# Patient Record
Sex: Female | Born: 1965 | Race: Black or African American | Hispanic: No | Marital: Married | Smoking: Current some day smoker
Health system: Southern US, Community
[De-identification: ages and names within clinical notes are randomized; demographics above are authoritative.]

## PROBLEM LIST (undated history)

## (undated) DIAGNOSIS — J45909 Unspecified asthma, uncomplicated: Secondary | ICD-10-CM

## (undated) DIAGNOSIS — I639 Cerebral infarction, unspecified: Secondary | ICD-10-CM

## (undated) DIAGNOSIS — I10 Essential (primary) hypertension: Secondary | ICD-10-CM

## (undated) DIAGNOSIS — F419 Anxiety disorder, unspecified: Secondary | ICD-10-CM

## (undated) DIAGNOSIS — R569 Unspecified convulsions: Secondary | ICD-10-CM

## (undated) HISTORY — DX: Cerebral infarction, unspecified: I63.9

## (undated) HISTORY — DX: Anxiety disorder, unspecified: F41.9

## (undated) HISTORY — PX: TUBAL LIGATION: SHX77

## (undated) HISTORY — DX: Essential (primary) hypertension: I10

## (undated) HISTORY — DX: Unspecified convulsions: R56.9

## (undated) HISTORY — DX: Unspecified asthma, uncomplicated: J45.909

---

## 1999-07-25 ENCOUNTER — Emergency Department (HOSPITAL_COMMUNITY): Admission: EM | Admit: 1999-07-25 | Discharge: 1999-07-25 | Payer: Self-pay | Admitting: Emergency Medicine

## 2000-10-10 ENCOUNTER — Emergency Department (HOSPITAL_COMMUNITY): Admission: EM | Admit: 2000-10-10 | Discharge: 2000-10-10 | Payer: Self-pay | Admitting: Emergency Medicine

## 2000-10-11 ENCOUNTER — Emergency Department (HOSPITAL_COMMUNITY): Admission: EM | Admit: 2000-10-11 | Discharge: 2000-10-11 | Payer: Self-pay | Admitting: Emergency Medicine

## 2003-04-06 ENCOUNTER — Encounter: Payer: Self-pay | Admitting: Emergency Medicine

## 2003-04-06 ENCOUNTER — Inpatient Hospital Stay (HOSPITAL_COMMUNITY): Admission: EM | Admit: 2003-04-06 | Discharge: 2003-04-10 | Payer: Self-pay | Admitting: Emergency Medicine

## 2003-04-07 ENCOUNTER — Encounter: Payer: Self-pay | Admitting: Internal Medicine

## 2003-04-09 ENCOUNTER — Encounter: Payer: Self-pay | Admitting: Internal Medicine

## 2003-04-16 ENCOUNTER — Emergency Department (HOSPITAL_COMMUNITY): Admission: EM | Admit: 2003-04-16 | Discharge: 2003-04-16 | Payer: Self-pay | Admitting: *Deleted

## 2017-02-23 LAB — GLUCOSE, POCT (MANUAL RESULT ENTRY): POC GLUCOSE: 117 mg/dL — AB (ref 70–99)

## 2017-04-28 NOTE — Congregational Nurse Program (Signed)
Congregational Nurse Program Note  Date of Encounter: 04/15/2017  Past Medical History: No past medical history on file.  Encounter Details:     CNP Questionnaire - 04/15/17 1602      Patient Demographics   Is this a new or existing patient? New   Patient is considered a/an Not Applicable   Race American Indian/[place] Native     Patient Assistance   Location of Patient Assistance Not Applicable   Patient's financial/insurance status Low Income;Self-Pay (Uninsured)   Uninsured Patient ([employer] Card/Care Connects) Yes   Interventions Assisted patient in making appt.   Patient referred to apply for the following financial assistance [employer]   Food insecurities addressed Not Applicable   Transportation assistance No   Assistance securing medications No   Product/process development scientistducational health offerings Navigating the healthcare system     Encounter Details   Primary purpose of visit Navigating the Healthcare System   Was an Emergency Department visit averted? Not Applicable   Does patient have a medical provider? No   Patient referred to Clinic   Was a mental health screening completed? (GAINS tool) No   Does patient have dental issues? No   Does patient have vision issues? No   Does your patient have an abnormal blood pressure today? No   Since previous encounter, have you referred patient for abnormal blood pressure that resulted in a new diagnosis or medication change? No   Does your patient have an abnormal blood glucose today? No   Since previous encounter, have you referred patient for abnormal blood glucose that resulted in a new diagnosis or medication change? No   Was there a life-saving intervention made? No      Requested assistance with orange card application.  Also needs a HCP.  Assisted with appointment with Lavinia SharpsMary Ann Placey NP at the [facility]

## 2017-05-24 ENCOUNTER — Emergency Department (HOSPITAL_COMMUNITY)
Admission: EM | Admit: 2017-05-24 | Discharge: 2017-05-24 | Disposition: A | Discharge status: Home / Self Care | Payer: Self-pay | Attending: Emergency Medicine | Admitting: Emergency Medicine

## 2017-05-24 ENCOUNTER — Encounter (HOSPITAL_COMMUNITY): Payer: Self-pay | Admitting: Emergency Medicine

## 2017-05-24 DIAGNOSIS — Z5321 Procedure and treatment not carried out due to patient leaving prior to being seen by health care provider: Secondary | ICD-10-CM | POA: Insufficient documentation

## 2017-05-24 DIAGNOSIS — M79641 Pain in right hand: Secondary | ICD-10-CM | POA: Insufficient documentation

## 2017-05-24 DIAGNOSIS — M79642 Pain in left hand: Secondary | ICD-10-CM | POA: Insufficient documentation

## 2017-05-24 NOTE — ED Triage Notes (Signed)
I have made 2 different attempts to locate Pt in front lobby. Pt does answer when name is called.

## 2017-05-24 NOTE — ED Notes (Signed)
Called pt for rounding no answer

## 2017-05-24 NOTE — ED Triage Notes (Signed)
Pt sts bilateral hand pain and swelling starting last night; pt unsure of what caused swelling

## 2018-02-27 ENCOUNTER — Emergency Department (HOSPITAL_COMMUNITY)
Admission: EM | Admit: 2018-02-27 | Discharge: 2018-02-27 | Disposition: A | Discharge status: Home / Self Care | Payer: Self-pay | Attending: Emergency Medicine | Admitting: Emergency Medicine

## 2018-02-27 ENCOUNTER — Encounter (HOSPITAL_COMMUNITY): Payer: Self-pay

## 2018-02-27 DIAGNOSIS — F17200 Nicotine dependence, unspecified, uncomplicated: Secondary | ICD-10-CM | POA: Insufficient documentation

## 2018-02-27 DIAGNOSIS — H60503 Unspecified acute noninfective otitis externa, bilateral: Secondary | ICD-10-CM | POA: Insufficient documentation

## 2018-02-27 MED ORDER — CIPROFLOXACIN-DEXAMETHASONE 0.3-0.1 % OT SUSP
4.0000 [drp] | Freq: Two times a day (BID) | OTIC | Status: DC
  Filled 2018-02-27: qty 7.5

## 2018-02-27 NOTE — ED Triage Notes (Signed)
Pt presents for evaluation of R ear pain x 2 weeks. Reports ear has been swelling and is not able to hear out of it. Pt reports no other issues.

## 2018-02-27 NOTE — ED Provider Notes (Signed)
[facility] EMERGENCY DEPARTMENT Provider Note   CSN: [id number] Arrival date & time: 02/27/18  1420   History   Chief Complaint Chief Complaint  Patient presents with  . Otalgia    HPI Brandy Nguyen is a 52 y.o. female.  HPI   52 year old female presents today with complaints of bilateral ear pain.  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  She notes that when she gets home she washes her ears out with soap, she notes irritation to the right external ear with swelling of the canal, she notes she has been using sweet oil without improvement in symptoms.  Also notes some developing soreness in the left ear.  She denies any hearing changes on the left, notes some hearing changes on the right, she notes pain with palpation of the tragus, no surrounding swelling or edema, no fever, no other complaints.  She is not diabetic.   History reviewed. No pertinent past medical history.  There are no active problems to display for this patient.   History reviewed. No pertinent surgical history.   OB History   None      Home Medications    Prior to Admission medications   Not on File    Family History No family history on file.  Social History Social History   Tobacco Use  . Smoking status: Current Every Day Smoker  . Smokeless tobacco: Never Used  Substance Use Topics  . Alcohol use: Yes  . Drug use: No     Allergies   Patient has no known allergies.   Review of Systems Review of Systems  All other systems reviewed and are negative.    Physical Exam Updated Vital Signs BP (!) 148/94 (BP Location: Right Arm)   Pulse 61   Temp 98 F (36.7 C) (Oral)   Resp 18   SpO2 100%   Physical Exam  Constitutional: She is oriented to person, place, and time. She appears well-developed and well-nourished.  HENT:  Head: Normocephalic and atraumatic.  Right external auditory canal with significant swelling unable to visualize  the TM, no purulent discharge, pain with palpation of the tragus, no mastoid tenderness of surrounding redness tenderness warmth to touch  Left external auditory canal slightly erythematous, TM normal, no discharge  Eyes: Pupils are equal, round, and reactive to light. Conjunctivae are normal. Right eye exhibits no discharge. Left eye exhibits no discharge. No scleral icterus.  Neck: Normal range of motion. No JVD present. No tracheal deviation present.  Pulmonary/Chest: Effort normal. No stridor.  Neurological: She is alert and oriented to person, place, and time. Coordination normal.  Psychiatric: She has a normal mood and affect. Her behavior is normal. Judgment and thought content normal.  Nursing note and vitals reviewed.    ED Treatments / Results  Labs (all labs ordered are listed, but only abnormal results are displayed) Labs Reviewed - No data to display  EKG None  Radiology No results found.  Procedures Procedures (including critical care time)  Medications Ordered in ED Medications  ciprofloxacin-dexamethasone (CIPRODEX) 0.3-0.1 % OTIC (EAR) suspension 4 drop (has no administration in time range)     Initial Impression / Assessment and Plan / ED Course  I have reviewed the triage vital signs and the nursing notes.  Pertinent labs & imaging results that were available during my care of the patient were reviewed by me and considered in my medical decision making (see chart for details).  [age]year old female presents today with otitis externa.  Patient has significant swelling on the right, ear wick was placed with Ciprodex drops.  Patient discharged home with medication, she will follow-up if symptoms worsen or persist, return immediately at any point for any concerning signs or symptoms.  Patient verbalized understanding and agreement to today's plan had no further questions or concerns.  Final Clinical Impressions(s) / ED Diagnoses   Final diagnoses:  Acute  otitis externa of both ears, unspecified type    ED Discharge Orders    None       Eyvonne Mechanic, PA-C 02/27/18 1707    Pricilla Loveless, MD 02/28/18 2239

## 2018-02-27 NOTE — Discharge Instructions (Signed)
Please read attached information. If you experience any new or worsening signs or symptoms please return to the emergency room for evaluation. Please follow-up with your primary care provider or specialist as discussed. Please use medication prescribed (4 drops in each ear twice a day for 7 days) discontinue taking if you have any concerning signs or symptoms.

## 2020-05-27 ENCOUNTER — Ambulatory Visit: Payer: Self-pay | Attending: Internal Medicine

## 2020-05-27 DIAGNOSIS — Z23 Encounter for immunization: Secondary | ICD-10-CM

## 2020-05-27 NOTE — Progress Notes (Signed)
   Covid-19 Vaccination Clinic  Name:  Brandy Nguyen    MRN: [id number] DOB: 01-Apr-1966  05/27/2020  Ms. Meter was observed post Covid-19 immunization for 15 minutes without incident. She was provided with Vaccine Information Sheet and instruction to access the V-Safe system.   [name] was instructed to call 911 with any severe reactions post vaccine: Marland Kitchen Difficulty breathing  . Swelling of face and throat  . A fast heartbeat  . A bad rash all over body  . Dizziness and weakness   Immunizations Administered    Name Date Dose VIS Date Route   Moderna COVID-19 Vaccine 05/27/2020 11:12 AM 0.5 mL 09/2019 Intramuscular   Manufacturer: Moderna   Lot: [id number]   NDC: 87681-157-26

## 2020-06-24 ENCOUNTER — Ambulatory Visit: Payer: Self-pay

## 2021-03-10 ENCOUNTER — Emergency Department (HOSPITAL_COMMUNITY): Payer: Self-pay

## 2021-03-10 ENCOUNTER — Inpatient Hospital Stay (HOSPITAL_COMMUNITY)
Admission: EM | Admit: 2021-03-10 | Discharge: 2021-03-17 | DRG: 064 | Disposition: A | Discharge status: Rehabilitation Facility | Payer: Self-pay | Attending: Family Medicine | Admitting: Family Medicine

## 2021-03-10 ENCOUNTER — Inpatient Hospital Stay (HOSPITAL_COMMUNITY): Payer: Self-pay

## 2021-03-10 DIAGNOSIS — H538 Other visual disturbances: Secondary | ICD-10-CM | POA: Diagnosis present

## 2021-03-10 DIAGNOSIS — R29810 Facial weakness: Secondary | ICD-10-CM | POA: Diagnosis present

## 2021-03-10 DIAGNOSIS — E7800 Pure hypercholesterolemia, unspecified: Secondary | ICD-10-CM | POA: Diagnosis present

## 2021-03-10 DIAGNOSIS — I639 Cerebral infarction, unspecified: Secondary | ICD-10-CM | POA: Diagnosis present

## 2021-03-10 DIAGNOSIS — W19XXXA Unspecified fall, initial encounter: Secondary | ICD-10-CM | POA: Diagnosis present

## 2021-03-10 DIAGNOSIS — I63511 Cerebral infarction due to unspecified occlusion or stenosis of right middle cerebral artery: Principal | ICD-10-CM | POA: Diagnosis present

## 2021-03-10 DIAGNOSIS — Z720 Tobacco use: Secondary | ICD-10-CM | POA: Diagnosis present

## 2021-03-10 DIAGNOSIS — D751 Secondary polycythemia: Secondary | ICD-10-CM | POA: Diagnosis present

## 2021-03-10 DIAGNOSIS — F70 Mild intellectual disabilities: Secondary | ICD-10-CM | POA: Diagnosis present

## 2021-03-10 DIAGNOSIS — G8194 Hemiplegia, unspecified affecting left nondominant side: Secondary | ICD-10-CM | POA: Diagnosis present

## 2021-03-10 DIAGNOSIS — F1410 Cocaine abuse, uncomplicated: Secondary | ICD-10-CM | POA: Diagnosis present

## 2021-03-10 DIAGNOSIS — I629 Nontraumatic intracranial hemorrhage, unspecified: Secondary | ICD-10-CM | POA: Diagnosis present

## 2021-03-10 DIAGNOSIS — R29702 NIHSS score 2: Secondary | ICD-10-CM | POA: Diagnosis present

## 2021-03-10 DIAGNOSIS — D72829 Elevated white blood cell count, unspecified: Secondary | ICD-10-CM | POA: Diagnosis present

## 2021-03-10 DIAGNOSIS — E785 Hyperlipidemia, unspecified: Secondary | ICD-10-CM | POA: Diagnosis present

## 2021-03-10 DIAGNOSIS — E876 Hypokalemia: Secondary | ICD-10-CM | POA: Diagnosis present

## 2021-03-10 DIAGNOSIS — Z20822 Contact with and (suspected) exposure to covid-19: Secondary | ICD-10-CM | POA: Diagnosis present

## 2021-03-10 DIAGNOSIS — R29898 Other symptoms and signs involving the musculoskeletal system: Secondary | ICD-10-CM | POA: Diagnosis present

## 2021-03-10 DIAGNOSIS — R471 Dysarthria and anarthria: Secondary | ICD-10-CM | POA: Diagnosis present

## 2021-03-10 DIAGNOSIS — R414 Neurologic neglect syndrome: Secondary | ICD-10-CM | POA: Diagnosis present

## 2021-03-10 DIAGNOSIS — E860 Dehydration: Secondary | ICD-10-CM | POA: Diagnosis present

## 2021-03-10 DIAGNOSIS — Z91410 Personal history of adult physical and sexual abuse: Secondary | ICD-10-CM

## 2021-03-10 DIAGNOSIS — K5900 Constipation, unspecified: Secondary | ICD-10-CM | POA: Diagnosis present

## 2021-03-10 DIAGNOSIS — G40909 Epilepsy, unspecified, not intractable, without status epilepticus: Secondary | ICD-10-CM

## 2021-03-10 DIAGNOSIS — R54 Age-related physical debility: Secondary | ICD-10-CM | POA: Diagnosis present

## 2021-03-10 DIAGNOSIS — N179 Acute kidney failure, unspecified: Secondary | ICD-10-CM | POA: Diagnosis not present

## 2021-03-10 DIAGNOSIS — F17210 Nicotine dependence, cigarettes, uncomplicated: Secondary | ICD-10-CM | POA: Diagnosis present

## 2021-03-10 DIAGNOSIS — Z833 Family history of diabetes mellitus: Secondary | ICD-10-CM

## 2021-03-10 DIAGNOSIS — Z609 Problem related to social environment, unspecified: Secondary | ICD-10-CM | POA: Diagnosis present

## 2021-03-10 DIAGNOSIS — G936 Cerebral edema: Secondary | ICD-10-CM | POA: Diagnosis present

## 2021-03-10 DIAGNOSIS — R9431 Abnormal electrocardiogram [ECG] [EKG]: Secondary | ICD-10-CM | POA: Diagnosis present

## 2021-03-10 DIAGNOSIS — I10 Essential (primary) hypertension: Secondary | ICD-10-CM | POA: Diagnosis present

## 2021-03-10 DIAGNOSIS — R569 Unspecified convulsions: Secondary | ICD-10-CM | POA: Diagnosis present

## 2021-03-10 LAB — DIFFERENTIAL
Abs Immature Granulocytes: 0.05 10*3/uL (ref 0.00–0.07)
Basophils Absolute: 0 10*3/uL (ref 0.0–0.1)
Basophils Relative: 0 %
Eosinophils Absolute: 0 10*3/uL (ref 0.0–0.5)
Eosinophils Relative: 0 %
Immature Granulocytes: 0 %
Lymphocytes Relative: 10 %
Lymphs Abs: 1.3 10*3/uL (ref 0.7–4.0)
Monocytes Absolute: 0.6 10*3/uL (ref 0.1–1.0)
Monocytes Relative: 4 %
Neutro Abs: 11.3 10*3/uL — ABNORMAL HIGH (ref 1.7–7.7)
Neutrophils Relative %: 86 %

## 2021-03-10 LAB — CBC
HCT: 52.2 % — ABNORMAL HIGH (ref 36.0–46.0)
Hemoglobin: 17.7 g/dL — ABNORMAL HIGH (ref 12.0–15.0)
MCH: 31.9 pg (ref 26.0–34.0)
MCHC: 33.9 g/dL (ref 30.0–36.0)
MCV: 94.1 fL (ref 80.0–100.0)
Platelets: 250 10*3/uL (ref 150–400)
RBC: 5.55 MIL/uL — ABNORMAL HIGH (ref 3.87–5.11)
RDW: 13 % (ref 11.5–15.5)
WBC: 13.2 10*3/uL — ABNORMAL HIGH (ref 4.0–10.5)
nRBC: 0 % (ref 0.0–0.2)

## 2021-03-10 LAB — COMPREHENSIVE METABOLIC PANEL
ALT: 14 U/L (ref 0–44)
AST: 30 U/L (ref 15–41)
Albumin: 2.4 g/dL — ABNORMAL LOW (ref 3.5–5.0)
Alkaline Phosphatase: 53 U/L (ref 38–126)
Anion gap: 7 (ref 5–15)
BUN: 8 mg/dL (ref 6–20)
CO2: 18 mmol/L — ABNORMAL LOW (ref 22–32)
Calcium: 6.8 mg/dL — ABNORMAL LOW (ref 8.9–10.3)
Chloride: 114 mmol/L — ABNORMAL HIGH (ref 98–111)
Creatinine, Ser: 0.77 mg/dL (ref 0.44–1.00)
GFR, Estimated: >60 mL/min (ref >60)
Glucose, Bld: 97 mg/dL (ref 70–99)
Potassium: 3.3 mmol/L — ABNORMAL LOW (ref 3.5–5.1)
Sodium: 139 mmol/L (ref 135–145)
Total Bilirubin: 0.6 mg/dL (ref 0.3–1.2)
Total Protein: 4.7 g/dL — ABNORMAL LOW (ref 6.5–8.1)

## 2021-03-10 LAB — URINALYSIS, ROUTINE W REFLEX MICROSCOPIC
Bilirubin Urine: NEGATIVE
Glucose, UA: NEGATIVE mg/dL
Hgb urine dipstick: NEGATIVE
Ketones, ur: NEGATIVE mg/dL
Leukocytes,Ua: NEGATIVE
Nitrite: NEGATIVE
Protein, ur: NEGATIVE mg/dL
Specific Gravity, Urine: >1.046 — ABNORMAL HIGH (ref 1.005–1.030)
pH: 5 (ref 5.0–8.0)

## 2021-03-10 LAB — RAPID URINE DRUG SCREEN, HOSP PERFORMED
Amphetamines: NOT DETECTED
Barbiturates: NOT DETECTED
Benzodiazepines: NOT DETECTED
Cocaine: POSITIVE — AB
Opiates: NOT DETECTED
Tetrahydrocannabinol: NOT DETECTED

## 2021-03-10 LAB — MAGNESIUM: Magnesium: 1.1 mg/dL — ABNORMAL LOW (ref 1.7–2.4)

## 2021-03-10 LAB — CBG MONITORING, ED: Glucose-Capillary: 139 mg/dL — ABNORMAL HIGH (ref 70–99)

## 2021-03-10 LAB — RESP PANEL BY RT-PCR (FLU A&B, COVID) ARPGX2
Influenza A by PCR: NEGATIVE
Influenza B by PCR: NEGATIVE
SARS Coronavirus 2 by RT PCR: NEGATIVE

## 2021-03-10 MED ORDER — ACETAMINOPHEN 500 MG PO TABS
1000.0000 mg | ORAL_TABLET | Freq: Once | ORAL | Status: AC
  Administered 2021-03-10: 1000 mg via ORAL
  Filled 2021-03-10: qty 2

## 2021-03-10 MED ORDER — DEXAMETHASONE SODIUM PHOSPHATE 4 MG/ML IJ SOLN: 4.0000 mg | Freq: Four times a day (QID) | INTRAMUSCULAR | Status: DC

## 2021-03-10 MED ORDER — LEVETIRACETAM IN NACL 500 MG/100ML IV SOLN
500.0000 mg | Freq: Two times a day (BID) | INTRAVENOUS | Status: DC
  Administered 2021-03-10 – 2021-03-12 (×3): 500 mg via INTRAVENOUS
  Filled 2021-03-10 (×3): qty 100

## 2021-03-10 MED ORDER — ACETAMINOPHEN 160 MG/5ML PO SOLN: 650.0000 mg | ORAL | Status: DC | PRN

## 2021-03-10 MED ORDER — LEVETIRACETAM IN NACL 1500 MG/100ML IV SOLN
1500.0000 mg | Freq: Once | INTRAVENOUS | Status: AC
  Administered 2021-03-10: 1500 mg via INTRAVENOUS
  Filled 2021-03-10: qty 100

## 2021-03-10 MED ORDER — POTASSIUM CHLORIDE 10 MEQ/100ML IV SOLN
10.0000 meq | INTRAVENOUS | Status: DC
  Administered 2021-03-10 (×2): 10 meq via INTRAVENOUS
  Filled 2021-03-10 (×2): qty 100

## 2021-03-10 MED ORDER — MAGNESIUM SULFATE 4 GM/100ML IV SOLN
4.0000 g | Freq: Once | INTRAVENOUS | Status: AC
  Administered 2021-03-11: 4 g via INTRAVENOUS
  Filled 2021-03-10: qty 100

## 2021-03-10 MED ORDER — POTASSIUM CHLORIDE 10 MEQ/100ML IV SOLN
10.0000 meq | INTRAVENOUS | Status: DC
  Administered 2021-03-10: 10 meq via INTRAVENOUS
  Filled 2021-03-10: qty 100

## 2021-03-10 MED ORDER — STROKE: EARLY STAGES OF RECOVERY BOOK
Freq: Once | Status: DC
  Filled 2021-03-10: qty 1

## 2021-03-10 MED ORDER — ACETAMINOPHEN 325 MG PO TABS
650.0000 mg | ORAL_TABLET | Freq: Four times a day (QID) | ORAL | Status: DC | PRN
  Administered 2021-03-13 – 2021-03-16 (×3): 650 mg via ORAL
  Filled 2021-03-10 (×4): qty 2

## 2021-03-10 MED ORDER — ACETAMINOPHEN 650 MG RE SUPP: 650.0000 mg | RECTAL | Status: DC | PRN

## 2021-03-10 MED ORDER — POTASSIUM CHLORIDE CRYS ER 10 MEQ PO TBCR
30.0000 meq | EXTENDED_RELEASE_TABLET | Freq: Once | ORAL | Status: AC
  Administered 2021-03-11: 30 meq via ORAL
  Filled 2021-03-10: qty 3

## 2021-03-10 MED ORDER — GADOBUTROL 1 MMOL/ML IV SOLN
7.0000 mL | Freq: Once | INTRAVENOUS | Status: AC | PRN
  Administered 2021-03-10: 7 mL via INTRAVENOUS

## 2021-03-10 MED ORDER — ACETAMINOPHEN 650 MG RE SUPP: 650.0000 mg | Freq: Four times a day (QID) | RECTAL | Status: DC | PRN

## 2021-03-10 MED ORDER — HYDRALAZINE HCL 20 MG/ML IJ SOLN
5.0000 mg | Freq: Once | INTRAMUSCULAR | Status: AC | PRN
  Administered 2021-03-10: 5 mg via INTRAVENOUS
  Filled 2021-03-10: qty 1

## 2021-03-10 MED ORDER — IOHEXOL 350 MG/ML SOLN
100.0000 mL | Freq: Once | INTRAVENOUS | Status: AC | PRN
  Administered 2021-03-10: 75 mL via INTRAVENOUS

## 2021-03-10 MED ORDER — CLEVIDIPINE BUTYRATE 0.5 MG/ML IV EMUL: 0.0000 mg/h | INTRAVENOUS | Status: DC

## 2021-03-10 MED ORDER — ACETAMINOPHEN 325 MG PO TABS: 650.0000 mg | ORAL_TABLET | ORAL | Status: DC | PRN

## 2021-03-10 MED ORDER — SENNOSIDES-DOCUSATE SODIUM 8.6-50 MG PO TABS
1.0000 | ORAL_TABLET | Freq: Two times a day (BID) | ORAL | Status: DC
  Administered 2021-03-12 – 2021-03-17 (×11): 1 via ORAL
  Filled 2021-03-10 (×11): qty 1

## 2021-03-10 MED ORDER — PANTOPRAZOLE SODIUM 40 MG IV SOLR
40.0000 mg | Freq: Every day | INTRAVENOUS | Status: DC
  Administered 2021-03-10 – 2021-03-12 (×2): 40 mg via INTRAVENOUS
  Filled 2021-03-10 (×2): qty 40

## 2021-03-10 MED ORDER — LABETALOL HCL 5 MG/ML IV SOLN: 10.0000 mg | Freq: Once | INTRAVENOUS | Status: DC | PRN

## 2021-03-10 NOTE — ED Notes (Signed)
Assumed care of patient.

## 2021-03-10 NOTE — ED Notes (Signed)
Patient transported to CT 

## 2021-03-10 NOTE — ED Notes (Signed)
ED Provider at bedside. 

## 2021-03-10 NOTE — ED Provider Notes (Signed)
[facility] EMERGENCY DEPARTMENT Provider Note   CSN: [id number] Arrival date & time: 03/10/21  1050     History Chief Complaint  Patient presents with  . Seizures    Brandy Nguyen is a 55 y.o. female.  Patient is a 55 year old female with a history of tobacco abuse who has not seen a doctor in years presenting today with multiple complaints.  Patient reports that on Sunday she had a fall when she tried to get out of bed and her left leg would not work.  Family member reports that since Sunday which is 3 days prior to arrival the patient has not been herself.  She has been moving slowly having difficulty with her vision.  Patient does complain of headaches and initially stated her left arm would not move at all but now seems to be working more normally.  Today family member reports that she had a 45-second episode of shaking which self resolved.  She did not have loss of consciousness or unresponsiveness during this event.  Patient currently is complaining of headache.  She takes new medications at home.  She has no chest pain, shortness of breath, abdominal pain, nausea or vomiting.  EMS noted that patient was hypertensive in route.  Blood sugar was within normal limits.  They witnessed no seizure-like activity upon their arrival and patient was not displaying signs of being postictal.  The history is provided by the patient, the EMS personnel and a relative.  Seizures      No past medical history on file.  There are no problems to display for this patient.   No past surgical history on file.   OB History   No obstetric history on file.     No family history on file.  Social History   Tobacco Use  . Smoking status: Current Every Day Smoker  . Smokeless tobacco: Never Used  Substance Use Topics  . Alcohol use: Yes  . Drug use: No    Home Medications Prior to Admission medications   Not on File    Allergies    Patient has no known  allergies.  Review of Systems   Review of Systems  Neurological: Positive for seizures.  All other systems reviewed and are negative.   Physical Exam Updated Vital Signs BP (!) 158/119 (BP Location: Right Arm)   Pulse 76   Temp 98.9 F (37.2 C) (Oral)   Resp 15   SpO2 98%   Physical Exam Vitals and nursing note reviewed.  Constitutional:      General: She is not in acute distress.    Appearance: Normal appearance. She is well-developed.  HENT:     Head: Normocephalic and atraumatic.  Eyes:     General: Visual field deficit present.     Conjunctiva/sclera: Conjunctivae normal.     Pupils: Pupils are equal, round, and reactive to light.  Cardiovascular:     Rate and Rhythm: Normal rate and regular rhythm.     Heart sounds: No murmur heard.   Pulmonary:     Effort: Pulmonary effort is normal. No respiratory distress.     Breath sounds: Normal breath sounds. No wheezing or rales.  Abdominal:     General: There is no distension.     Palpations: Abdomen is soft.     Tenderness: There is no abdominal tenderness. There is no guarding or rebound.  Musculoskeletal:        General: No tenderness. Normal range of motion.  Cervical back: Normal range of motion and neck supple.  Skin:    General: Skin is warm and dry.     Findings: No erythema or rash.  Neurological:     Mental Status: She is alert.     Cranial Nerves: No dysarthria or facial asymmetry.     Sensory: Sensation is intact.     Motor: Pronator drift present.     Coordination: Finger-Nose-Finger Test normal.     Comments: Oriented to person and place.  Patient is not able to track eyes to the left beyond midline.  Visual Field cut on the left.  No notable weakness in all 4 extremities but mild pronator drift in the left upper extremity.  Psychiatric:        Mood and Affect: Mood normal.        Behavior: Behavior normal.     ED Results / Procedures / Treatments   Labs (all labs ordered are listed, but only  abnormal results are displayed) Labs Reviewed  CBC - Abnormal; Notable for the following components:      Result Value   WBC 13.2 (*)    RBC 5.55 (*)    Hemoglobin 17.7 (*)    HCT 52.2 (*)    All other components within normal limits  DIFFERENTIAL - Abnormal; Notable for the following components:   Neutro Abs 11.3 (*)    All other components within normal limits  COMPREHENSIVE METABOLIC PANEL - Abnormal; Notable for the following components:   Potassium 3.3 (*)    Chloride 114 (*)    CO2 18 (*)    Calcium 6.8 (*)    Total Protein 4.7 (*)    Albumin 2.4 (*)    All other components within normal limits  CBG MONITORING, ED - Abnormal; Notable for the following components:   Glucose-Capillary 139 (*)    All other components within normal limits  RESP PANEL BY RT-PCR (FLU A&B, COVID) ARPGX2  RAPID URINE DRUG SCREEN, HOSP PERFORMED  URINALYSIS, ROUTINE W REFLEX MICROSCOPIC    EKG EKG Interpretation  Date/Time:  Tuesday March 10 2021 11:04:25 EDT Ventricular Rate:  75 PR Interval:  146 QRS Duration: 82 QT Interval:  520 QTC Calculation: 580 R Axis:   -45 Text Interpretation: Normal sinus rhythm Right atrial enlargement Left axis deviation Minimal voltage criteria for LVH, may be normal variant ( Cornell product ) Prolonged QT Confirmed by Gwyneth Sprout ([id number]) on 03/10/2021 1:54:36 PM    Radiology CT HEAD WO CONTRAST  Result Date: 03/10/2021 CLINICAL DATA:  Possible seizure EXAM: CT HEAD WITHOUT CONTRAST TECHNIQUE: Contiguous axial images were obtained from the base of the skull through the vertex without intravenous contrast. COMPARISON:  None. FINDINGS: Brain: There is concern for a mass within the right frontoparietal region measuring approximately 2.5 cm. Surrounding edema throughout the posterior frontal and parietal lobes. No significant midline shift. No hydrocephalus. Vascular: No hyperdense vessel or unexpected calcification. Skull: No acute calvarial abnormality.  Sinuses/Orbits: No acute findings Other: None IMPRESSION: Concern for 2.5 cm right frontoparietal mass with surrounding edema. Recommend further evaluation with MRI. These results were called by telephone at the time of interpretation on 03/10/2021 at 11:57 am to provider [facility] , who verbally acknowledged these results. Electronically Signed   By: Charlett Nose M.D.   On: 03/10/2021 11:58    Procedures Procedures   Medications Ordered in ED Medications - No data to display  ED Course  I have reviewed the triage vital signs and  the nursing notes.  Pertinent labs & imaging results that were available during my care of the patient were reviewed by me and considered in my medical decision making (see chart for details).    MDM Rules/Calculators/A&P                          Pleasant 55 year old female presenting today with symptoms concerning for acute head bleed, stroke, space-occupying lesion.  Patient with symptoms that could have been focal seizure today but 3 days now of left-sided deficits.  On exam patient does have a visual field cut.  There is no seizure-like activity at this time but does have a mild pronator drift on the left.  Patient is also hypertensive and has not seen a doctor for years and takes no medications.  No evidence of atrial fibrillation at this time.  Stroke labs and imaging are pending.  12:16 PM Patient's COVID is negative, CBC with mild leukocytosis of 13 hemoglobin of 17 and normal platelet count, CT today shows a concerning area of 2.5 cm right fronto parietal mass with surrounding edema and MRI was recommended.  Given findings of the mass and possible seizure activity today patient was loaded with 1500 mg of Keppra.  EKG with prolonged QT and findings consistent with LVH without old to compare.  CMP is still pending.  Will discuss with neurology.  3:00 PM MRI is more consistent with a stroke than mass.  There is also some decreased flow in the MCA territory  and they recommended a CTA.  Neurology is already consulted on the patient.  We will plan on admitting for further care.  Patient's symptoms have been ongoing for 3 to 4 days now and she is not a tPA candidate.  There is small petechial hemorrhage in the area as well.  MDM Number of Diagnoses or Management Options   Amount and/or Complexity of Data Reviewed Clinical lab tests: ordered and reviewed Tests in the radiology section of CPT: ordered and reviewed Tests in the medicine section of CPT: ordered and reviewed Decide to obtain previous medical records or to obtain history from someone other than the patient: yes Obtain history from someone other than the patient: yes Review and summarize past medical records: yes Discuss the patient with other providers: yes Independent visualization of images, tracings, or specimens: yes  Risk of Complications, Morbidity, and/or Mortality Diagnostic procedures: moderate Management options: moderate  Patient Progress Patient progress: stable    Final Clinical Impression(s) / ED Diagnoses Final diagnoses:  Seizure (HCC)  Cerebrovascular accident (CVA), unspecified mechanism (HCC)    Rx / DC Orders ED Discharge Orders    None       Gwyneth Sprout, MD 03/10/21 1502

## 2021-03-10 NOTE — ED Triage Notes (Signed)
BIB [facility] after pt boyfriend called to report pt having possible seizure activity with shaking lasting approx 40 sec. Pt also reports visual changes and L facial droop, numbness to face, and L arm coordination with contracture this AM. PT LKW 6/4 @ 20000

## 2021-03-10 NOTE — Progress Notes (Signed)
Received pt from the ED, alert and oriented but some forgetfulness, MD orders implemented, will continue to monitor

## 2021-03-10 NOTE — ED Notes (Signed)
Patient is resting comfortably. 

## 2021-03-10 NOTE — Progress Notes (Signed)
UDS positive for Cocaine.  Labetalol discontinued, patient had not required any and BP remains 140's.  Spoke with pharmacy and recommended to try Hydralazine for BP > 160 and will likely need Cleviprex if continues to remain elevated.  For now BP within goal 130-160.  Repeat CT at 2100 stable, showing areas of superimposed petechial hemorrhage similar to previous CT.  Associated localized edema with regional mass effect with 2 mm right -left shift, stable.  Neuro following and we appreciate any further recommendations.  Low threshold for call CCM to transfer to ICU for Neuro and BP monitoring.  Hydralazine 5 mg IV x 1 for SBP > 160.  Discussed with RN that BP needs will be managed by Neurology if requiring further antihypertensives.  Monitor for any mental status changes Neuro signs q1h Vital signs q2h  Dana Allan, MD [facility]

## 2021-03-10 NOTE — Progress Notes (Signed)
Not able to do EEG at this time due to patient location, will try back later once a in a room.

## 2021-03-10 NOTE — H&P (Signed)
Family Medicine Teaching [facility] Admission History and Physical Service Pager: [phone]  Patient name: RACHYL WUEBKER Medical record number: [id number] Date of birth: 11-22-1965 Age: 55 y.o. Gender: female  Primary Care Provider: Patient, No Pcp Per (Inactive) Consultants: Neurology  Code Status: Full Preferred Emergency Contact: boyfriend Brett Canales  Chief Complaint: seizure like activity   Assessment and Plan: LEAHMARIE GASIOROWSKI is a 55 y.o. female presenting with left sided weakness and seizure like activity.  Marland Kitchen PMH is significant for HTN and tobacco abuse.    Cerebrovascular Accident Hemorraghic  Patient presented after 4 days of headache, resolving extremity weakness and new seizure-like activity today. She has not seen a PCP in 5-10 years. History of hypertension but on no medications. In the ED, CT head was concerning for a frontoparietal mass therefore an MRI was obtained.  MRI showed large acute or early subacute right posterior MCA territory infarct with exuberant cortical edema, extensive petechial hemorrhage and sulcal/regional mass effect without midline shift. CTA Head/Neck pending for concern for right MCA diminished flow. ASA, Plavix, statin and pharmacological VTE prophylaxis not given due to bleeding concern. Patient loaded with Keppra in ED for concern for seizures. Neurology evaluated patient in the ED. Monitor blood pressures closely.  . Admit to FPTS attending Dr. McDiarmid  . Progressive unit, may require higher level of care  . Neurology/stroke team following, appreciate recommendations . Follow-up CTA head and neck . Serial CT Head . Hold statin for now  . A1c, Lipid panel, CBC, BMP in AM  . Echo ordered . Neuro checks q2h . Keppra 500 mg BID  . Consult: SLP, PT, OT, TOC . NPO until passes speech eval . Neurology recommends systolic BP 130-160, prefers diastolic BP <80 (see neurology note for parameters) . SCDs  . Vitals per routine  . Bedrest  . Follow  up ASA and Plavix recommendations after imaging     Hypertension Patient has not seen a physician in at least 5 years. She does not recall the medication she took previously. BP on admission 163/110.  - Neurology will set parameters for blood pressure as above  -TOC consult for PCP needs     Hypokalemia Potassium on admission 3.3. Given her prolonged QTc will keep K>4.  - Replete with 6 runs of potassium   Prolonged QTc  EKG with QTc 580.  - Obtain Mg  - Replete K as above  - AM EKG  - cardiac monitoring  - Mg >2, K>4  Tobacco Abuse  Smokes 1/3-1/2 ppd since she was 55 years old.  - Encourage smoking cessation    FEN/GI: NPO pending SLP evaluation Prophylaxis: SCDs   Disposition: Admit to progressive   History of Present Illness:  KILEIGH ORTMANN is a 55 y.o. female presenting with weakness and seizure-like-activity.  Patient reports bandlike headache started on Saturday and when she woke up and tried to walk she had left leg weakness.  Headache is bandlike and feels like pressure.  Headache is improving with Tylenol given in the ED.  When trying to get out of bed on Saturday, she felt like her leg was not working properly and continues to feel weak.  Leg weakness is improving.  Reports that her boyfriend Brett Canales was concerned because she had shaking.  He thought she was having a seizure. Shaking lasted <1 minute. She denies loss of consciousness or hitting her head.  She did feel like during this time she could not see.  Denies recent fall or  head trauma.  Reports difficulty going to the bathroom and believes she is constipated.  Has had a productive cough with nonbloody sputum production for the past 2 weeks.  Denies recent sick contacts, vomiting, diarrhea, congestion, rhinorrhea.  Of note, she has seen a physician in the office in 5 to 10 years.  She knows of her hypertensive diagnosis but is unable to tell me what medication she was on previously.    Review Of  Systems: Per HPI with the following additions:   Review of Systems  Constitutional: Negative for fever.  Eyes: Positive for blurred vision.  Respiratory: Positive for cough and sputum production. Negative for shortness of breath.   Cardiovascular: Negative for chest pain.  Gastrointestinal: Positive for constipation. Negative for abdominal pain, diarrhea, nausea and vomiting.  Genitourinary: Negative for dysuria.  Musculoskeletal: Negative for back pain.  Skin: Negative for rash.  Neurological: Positive for focal weakness, seizures and headaches. Negative for loss of consciousness.  Psychiatric/Behavioral: Negative for substance abuse.    Patient Active Problem List   Diagnosis Date Noted  . CVA (cerebral vascular accident) (HCC) 03/10/2021    Past Medical History: No past medical history on file.  Past Surgical History: No past surgical history on file.  Social History: Social History   Tobacco Use  . Smoking status: Current Every Day Smoker  . Smokeless tobacco: Never Used  Substance Use Topics  . Alcohol use: Yes  . Drug use: No   Additional social history: Smoked since 55 years old. Currents smokes 1/3-1/2 ppd. Lives with boyfriend Brett Canales.  Denies alcohol use and illicit drug use. Please also refer to relevant sections of EMR.  Family History: No family history on file. Family hx of T2DM.  No family history of seizure disorder.  Allergies and Medications: No Known Allergies No current facility-administered medications on file prior to encounter.   No current outpatient medications on file prior to encounter.    Objective: BP (!) 146/101 (BP Location: Right Arm)   Pulse 77   Temp 98.2 F (36.8 C) (Oral)   Resp 18   SpO2 96%  Exam: GEN:     Lethargic but quickly responds to voice, cooperative and no distress    HENT:  mucus membranes moist, oropharyngeal without lesions or erythema,  nares patent, no nasal discharge  EYES:   pupils equal and reactive, EOM  intact NECK:  supple, normal ROM, no lymphadenopathy  RESP:  clear to auscultation bilaterally, no increased work of breathing CVS:   regular rate and rhythm, no murmur ABD:  soft, non-tender; bowel sounds present; no palpable masses EXT:   normal ROM, atraumatic, no LE edema  NEURO:   speech normal w/o dysarthria or aphasia, alert and oriented x4, gross sensation intact, strength bilateral upper extremities 5/5, LLE 4+/5, RLE 5/5, no protonator drift Skin:   warm and dry, normal skin turgor Psych: Normal affect, appropriate speech and behavior    Labs and Imaging: CBC BMET  Recent Labs  Lab 03/10/21 1100  WBC 13.2*  HGB 17.7*  HCT 52.2*  PLT 250   Recent Labs  Lab 03/10/21 1223  NA 139  K 3.3*  CL 114*  CO2 18*  BUN 8  CREATININE 0.77  GLUCOSE 97  CALCIUM 6.8*     EKG: HR 75, NSR, no acute ST or T wave changes, prolonged QTc  CT HEAD WO CONTRAST  Result Date: 03/10/2021 CLINICAL DATA:  Possible seizure EXAM: CT HEAD WITHOUT CONTRAST TECHNIQUE: Contiguous axial images were  obtained from the base of the skull through the vertex without intravenous contrast. COMPARISON:  None. FINDINGS: Brain: There is concern for a mass within the right frontoparietal region measuring approximately 2.5 cm. Surrounding edema throughout the posterior frontal and parietal lobes. No significant midline shift. No hydrocephalus. Vascular: No hyperdense vessel or unexpected calcification. Skull: No acute calvarial abnormality. Sinuses/Orbits: No acute findings Other: None IMPRESSION: Concern for 2.5 cm right frontoparietal mass with surrounding edema. Recommend further evaluation with MRI. These results were called by telephone at the time of interpretation on 03/10/2021 at 11:57 am to provider [facility] , who verbally acknowledged these results. Electronically Signed   By: Charlett Nose M.D.   On: 03/10/2021 11:58   MR Brain W and Wo Contrast  Result Date: 03/10/2021 CLINICAL DATA:  Concern for  mass on CT head EXAM: MRI HEAD WITHOUT AND WITH CONTRAST TECHNIQUE: Multiplanar, multiecho pulse sequences of the brain and surrounding structures were obtained without and with intravenous contrast. CONTRAST:  27mL GADAVIST GADOBUTROL 1 MMOL/ML IV SOLN COMPARISON:  CT head from today. FINDINGS: Brain: Restricted diffusion in the posterior right insula and right posterior frontal, parietal, and temporal lobes, compatible with acute or early subacute infarct. There is exuberant associated edema. Extensive curvilinear cortical susceptibility artifact in these regions is compatible with prominent petechial hemorrhage. Curvilinear/gyriform areas of enhancement in these regions likely represents infarct enhancement due to blood brain barrier breakdown. Associated regional mass effect and sulcal effacement without midline shift. No hydrocephalus. No clear enhancing mass lesion. Vascular: Possible diminished right MCA flow void. Skull and upper cervical spine: Normal marrow signal. Sinuses/Orbits: Clear sinuses. Other: No sizable mastoid effusions. IMPRESSION: 1. Findings most consistent with a large acute or early subacute right posterior MCA territory infarct with exuberant cortical edema, extensive petechial hemorrhage, and gyriform enhancement likely relating to blood brain barrier breakdown. Sulcal/regional mass effect without midline shift. Recommend continued attention on follow-up imaging to ensure expected evolution and exclude underlying mass. 2. Possible diminished right MCA flow void, which could relate to slice selection versus underlying stenosis. Recommend CTA to further evaluate. Findings and recommendations discussed with Dr. Anitra Lauth via telephone at 2:58 PM. Electronically Signed   By: Feliberto Harts MD   On: 03/10/2021 15:05     Katha Cabal, DO 03/10/2021, 5:31 PM PGY-2, [facility] FPTS Intern pager: [phone], text pages welcome

## 2021-03-10 NOTE — Progress Notes (Signed)
Spoke with neurology NP regarding patient's need for tight blood pressure control. Systolic blood pressure needs to remain between 130-160. Provider was concerned that patient needs to be either progressive or ICU status in case there is a need to start Cleviprex for blood pressures as patient is NPO. Discussed that our team cannot manage that drip and patient would need to be admitted to the ICU floor if medication is necessary. For now, we will place patient in the progressive unit and will have labetalol as PRN blood pressure medication. If blood pressures are elevated above parameters, neurology will be managing in case there is need of the Cleviprex drip. - Progressive unit - Monitor BP with strict systolic parameters of 130-160  - BP management per neurology in case need for Cleviprex - Labetalol 10mg  once PRN if BP >160, then call neurology for initiation of Cleviprex if necessary   Gali Spinney, DO

## 2021-03-10 NOTE — ED Notes (Signed)
Neurology at bedside.

## 2021-03-10 NOTE — ED Notes (Signed)
Returned from MRI 

## 2021-03-10 NOTE — ED Notes (Signed)
Returned from CT.

## 2021-03-10 NOTE — ED Notes (Signed)
Patient transported to MRI 

## 2021-03-10 NOTE — Consult Note (Addendum)
Neurology Consultation  Reason for Consult: first time seizure, abnl CNS imaging  Referring Physician: Salley Scarlet, MD  CC: left leg weakness, visual field cut OS, ? abnl head CT History is obtained from: patient  HPI: Brandy Nguyen is a 55 y.o. female with a PMHx of tobacco abuse and none other known because she hasn't been to a MD in years. Patient reports that 3 days ago, she had a fall when she tried to get OOB and her left leg wouldn't work. She was able to scoot to the bathroom and pull herself up to toilet. Her leg weakness resolved over next hours. Her LUE felt weak as well, but states it is back to normal. No numbness and tingling. No n/v, dysarthria, aphasia, or slowed cognition. + blurry vision. Family reported a 45 second episode today of shaking which self resolved. Described as "shaking", with no LOC or post ictal period. No incontinence.   Patient denies history of brain tumor, brain bleed, head trauma, seizures, or strokes. No FMHx of seizures or strokes.   Workup in the ED included CTH with results below, MRI brain pending, WBCC 13.2, Hgb 17.7, K 3.3, creat .77. BP 164/114, EKG with LVH.  Her BP was high on admission, and in a normal stroke situation, we would allow permissive HTN. However, given the bleeding, she will need tight BP control with SBP 130-160.   When the [facility] was read, she was sent for MRI brain which showed a large acute, or early subacute infarct in Right MCA territory with cortical edema and hemorrhage and suspicion for diminished right MCA flow void and patient was then taken to CT for CTA head and neck which is pending.   Patient asked NP to call her boyfriend. I updated Brandy Nguyen about diagnosis thus far. Per her boyfriend, patient smokes up to 1/2 PPD for at least 20 years. She drinks about 4 beers per week and her last drink was 5-6 days ago.     Neurology asked to see due to abnl on [facility] and possible seizure activity this am. She was loaded with Keppra in  the ED, 1500mg .   ROS: A robust ROS was performed and is negative except as noted in the HPI.  PMHx: tobacco abuse.   FMHx: no strokes or seizures. Mother died of Alzheimer's disease.   Social History: 1/2 PPD x 20 years. ETOH 4 beers per week. Denies drug use.   Medications No current facility-administered medications for this encounter. No current outpatient medications on file.  Exam: Current vital signs: BP (!) 164/114 (BP Location: Right Arm)   Pulse 83   Temp 98.8 F (37.1 C) (Oral)   Resp 18   SpO2 98%  Vital signs in last 24 hours: Temp:  [98.8 F (37.1 C)-98.9 F (37.2 C)] 98.8 F (37.1 C) (06/07 1210) Pulse Rate:  [76-86] 83 (06/07 1301) Resp:  [15-18] 18 (06/07 1301) BP: (156-164)/(114-119) 164/114 (06/07 1301) SpO2:  [97 %-98 %] 98 % (06/07 1301)  PE: GENERAL: Fairly well appearing female. Does not appear toxic. Awake, alert in NAD. HEENT: - Normocephalic and atraumatic. LUNGS - Normal respiratory effort.  CV - RRR. ABDOMEN - Soft, nontender. Ext: warm, well perfused. Psych: affect dull, but calm, cooperative.  NEURO:  Mental Status: AA&Ox3  Speech/Language: speech is without dysarthria or aphasia. Naming, repetition, fluency, and comprehension intact.  Cranial Nerves:  II: 76mm/brisk OD. 27mm/brisk OS. Left homonymous hemianopsia. III, IV, VI: EOMI. Lid elevation symmetric and full.  V: sensation  is intact and symmetrical to face. Moves jaw back and forth.  VII: Smile is symmetrical. Able to puff cheeks and raise eyebrows.  VIII:hearing intact to voice IX, X: palate elevation is symmetric. Phonation normal.  XI: normal sternocleidomastoid and trapezius muscle strength BHA:LPFXTK is symmetrical without fasciculations.   Motor: RUE: grips  5/5       triceps 5/5  biceps  5/5                 LUE: grips  4/5     triceps 5 /5    biceps   5/5            RLE:  knee  5/5  thigh  5/5   plantar flexion  5/5   dorsiflexion   5/5            LLE:  knee  5/5    thigh  5/5   plantar flexion   5/5   dorsiflexion   5/5 Tone is normal. Bulk is normal.  Sensation- Intact to light touch bilaterally in all four extremities. Extinction present on the left with light touch to DSS.  Coordination: FTN intact bilaterally. No pronator drift.  DTRs: 1+ throughout.  Gait- deferred  NIHSS = 2 (VF and extinction). She has no drift in any extremity but L grip is slightly weak 4/5.  Labs I have reviewed labs in epic and the results pertinent to this consultation are: CBC    Component Value Date/Time   WBC 13.2 (H) 03/10/2021 1100   RBC 5.55 (H) 03/10/2021 1100   HGB 17.7 (H) 03/10/2021 1100   HCT 52.2 (H) 03/10/2021 1100   PLT 250 03/10/2021 1100   MCV 94.1 03/10/2021 1100   MCH 31.9 03/10/2021 1100   MCHC 33.9 03/10/2021 1100   RDW 13.0 03/10/2021 1100   LYMPHSABS 1.3 03/10/2021 1100   MONOABS 0.6 03/10/2021 1100   EOSABS 0.0 03/10/2021 1100   BASOSABS 0.0 03/10/2021 1100    CMP     Component Value Date/Time   NA 139 03/10/2021 1223   K 3.3 (L) 03/10/2021 1223   CL 114 (H) 03/10/2021 1223   CO2 18 (L) 03/10/2021 1223   GLUCOSE 97 03/10/2021 1223   BUN 8 03/10/2021 1223   CREATININE 0.77 03/10/2021 1223   CALCIUM 6.8 (L) 03/10/2021 1223   PROT 4.7 (L) 03/10/2021 1223   ALBUMIN 2.4 (L) 03/10/2021 1223   AST 30 03/10/2021 1223   ALT 14 03/10/2021 1223   ALKPHOS 53 03/10/2021 1223   BILITOT 0.6 03/10/2021 1223   GFRNONAA >60 03/10/2021 1223   Imaging MD reviewed the images obtained  CT head Concern for 2.5 cm right frontoparietal mass with surrounding edema. Recommend further evaluation with MRI.  MRI brain 1. Findings most consistent with a large acute or early subacute right posterior MCA territory infarct with exuberant cortical edema, extensive petechial hemorrhage, and gyriform enhancement likely relating to blood brain barrier breakdown. Sulcal/regional mass effect without midline shift. Recommend continued attention  on follow-up imaging to ensure expected evolution and exclude underlying mass. 2. Possible diminished right MCA flow void, which could relate to slice selection versus underlying stenosis. Recommend CTA to further evaluate.  Assessment: Brandy Nguyen is a 55 yo female with a PMHx of tobacco use and no other known due to no MD visit in years. She likely has had uncontrolled HTN as LVH on EKG. Her other stroke risk factor is tobacco abuse. She had what sounds like a seizure at  home and this could be arising from temporal involvement of stroke. Given the findings of possible diminished flow void right MCA, are awaiting results of CTA head and neck. Do not need to treat edema with medications at this point given sx onset 3 days ago and no signs of increased ICP currently, but she bears close monitoring of her mental status and serial CTHs.    Impression: 1. Large right MCA territory stroke with vasogenic edema and bleeding.   Recommendations/Plan:  -admit to progressive neuro unit by teaching service. Plan discussed with the service.  -CTA head and neck pending.  -SCDs-No chemical VTE prophylaxis until stability of hemorrhagic transformation demonstrated on f/u head CT. Antiplatelet and DVT prophylaxis will be based on stroke team recommendations. -CTH serial q 6 hrs overnight -2100 hours and 0300 hours have been ordered.  -CTH stat at any time she has a change in neuro status.  -Echo -PT/OT/ST -keep patient NPO for now.  -bedrest. -tight BP control of SBP between 130 and 160. If Labetalol not holding BP in parameters, she may need TF to ICU for Cleviprex drip.  -TSH, HbA1c, lipid panel.  -high intensity statin if LDL over 70. However, do not start yet due to bleeding with stroke.  -UA and culture.  -EEG.  -Keppra loaded in ED 1500mg . Continue Keppra at 500mg  IV q12 hours.  -seizure precautions.  -frequent neuro checks. -NIHSS per stroke protocol and/or per unit protocol. -Risk factor  modification with stroke education.  -tobacco cessation counseling.  -stroke team to follow.  - Post-discharge recommend f/u imaging with outpatient neurology to ensure expected evolution of infarct and to r/o underlying mass lesion  Pt seen by , NP/Neuro and later by MD. Note/plan to be edited by MD as needed.  Pager:  Neurology Attending Attestation  I examined patient with Ms. Jimmye Norman NP and edited above note to reflect my findings and recommendations. Patient has R MCA infarct with hemorrhagic transformation with LKW 3 days ago and first-time seizure today. Her exam is excellent given the size of her stroke; she really manifests mild L grip weakness and L-sided neglect. Will assess stability of bleeding with q 6 hr CT scans overnight. STAT head CT for any decline in neuro exam. Stroke team will take over consulting tomorrow.  Addendum: UDS (+) for cocaine. Labetalol d/c'd. Substance abuse counseling to be provided.  [phone], MD [facility] [phone]  If 7pm- 7am, please page neurology on call as listed in AMION.

## 2021-03-10 NOTE — Progress Notes (Signed)
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 Evaluated at bedside for the PM check.  Patient able to state her name, age and place and able to explain reason for her admission and understands that she has bleed in her brain and that is why she needs to be in hospital for observation.  Complains of mild headache.   O: BP (!) 142/104   Pulse 81   Temp 98.2 F (36.8 C) (Oral)   Resp 20   SpO2 96%     A/P: -- Conversation with neurologist Dr. Ezzie Dural about patient's possible transfer to neuro ICU if she needs to be on Cleviprex and he agrees with the plan. -- Patient would be transferred to progressive for now with neuro every 2 checks, serial CT scans and if unable to control her blood pressure then she would be transferred to neuro ICU. -- Conversation with RN on floor for this patient's delicate situation and strict neuro checks and serial CT scans and regular updates.  Arnoldo Lenis, MD 03/10/2021, 8:41 PM PGY-1, [facility] Family Medicine Service pager [phone]

## 2021-03-11 ENCOUNTER — Other Ambulatory Visit: Payer: Self-pay

## 2021-03-11 ENCOUNTER — Inpatient Hospital Stay (HOSPITAL_COMMUNITY): Payer: Self-pay

## 2021-03-11 ENCOUNTER — Encounter (HOSPITAL_COMMUNITY): Payer: Self-pay | Admitting: Family Medicine

## 2021-03-11 DIAGNOSIS — G936 Cerebral edema: Secondary | ICD-10-CM | POA: Diagnosis present

## 2021-03-11 DIAGNOSIS — I6389 Other cerebral infarction: Secondary | ICD-10-CM

## 2021-03-11 DIAGNOSIS — E7800 Pure hypercholesterolemia, unspecified: Secondary | ICD-10-CM | POA: Diagnosis present

## 2021-03-11 DIAGNOSIS — R569 Unspecified convulsions: Secondary | ICD-10-CM

## 2021-03-11 DIAGNOSIS — F70 Mild intellectual disabilities: Secondary | ICD-10-CM | POA: Diagnosis present

## 2021-03-11 DIAGNOSIS — I639 Cerebral infarction, unspecified: Secondary | ICD-10-CM

## 2021-03-11 DIAGNOSIS — E860 Dehydration: Secondary | ICD-10-CM | POA: Diagnosis present

## 2021-03-11 DIAGNOSIS — D751 Secondary polycythemia: Secondary | ICD-10-CM | POA: Diagnosis present

## 2021-03-11 DIAGNOSIS — E876 Hypokalemia: Secondary | ICD-10-CM

## 2021-03-11 DIAGNOSIS — D72829 Elevated white blood cell count, unspecified: Secondary | ICD-10-CM | POA: Diagnosis present

## 2021-03-11 DIAGNOSIS — E785 Hyperlipidemia, unspecified: Secondary | ICD-10-CM

## 2021-03-11 DIAGNOSIS — R471 Dysarthria and anarthria: Secondary | ICD-10-CM | POA: Diagnosis present

## 2021-03-11 HISTORY — DX: Hyperlipidemia, unspecified: E78.5

## 2021-03-11 LAB — COMPREHENSIVE METABOLIC PANEL
ALT: 17 U/L (ref 0–44)
AST: 24 U/L (ref 15–41)
Albumin: 3.1 g/dL — ABNORMAL LOW (ref 3.5–5.0)
Alkaline Phosphatase: 69 U/L (ref 38–126)
Anion gap: 9 (ref 5–15)
BUN: 7 mg/dL (ref 6–20)
CO2: 23 mmol/L (ref 22–32)
Calcium: 9.2 mg/dL (ref 8.9–10.3)
Chloride: 104 mmol/L (ref 98–111)
Creatinine, Ser: 1.1 mg/dL — ABNORMAL HIGH (ref 0.44–1.00)
GFR, Estimated: 59 mL/min — ABNORMAL LOW (ref >60)
Glucose, Bld: 113 mg/dL — ABNORMAL HIGH (ref 70–99)
Potassium: 4.1 mmol/L (ref 3.5–5.1)
Sodium: 136 mmol/L (ref 135–145)
Total Bilirubin: 0.8 mg/dL (ref 0.3–1.2)
Total Protein: 6.7 g/dL (ref 6.5–8.1)

## 2021-03-11 LAB — LIPID PANEL
Cholesterol: 234 mg/dL — ABNORMAL HIGH (ref 0–200)
HDL: 81 mg/dL (ref >40)
LDL Cholesterol: 142 mg/dL — ABNORMAL HIGH (ref 0–99)
Total CHOL/HDL Ratio: 2.9 RATIO
Triglycerides: 56 mg/dL (ref <150)
VLDL: 11 mg/dL (ref 0–40)

## 2021-03-11 LAB — CBC
HCT: 47.3 % — ABNORMAL HIGH (ref 36.0–46.0)
Hemoglobin: 16.3 g/dL — ABNORMAL HIGH (ref 12.0–15.0)
MCH: 31.5 pg (ref 26.0–34.0)
MCHC: 34.5 g/dL (ref 30.0–36.0)
MCV: 91.3 fL (ref 80.0–100.0)
Platelets: 266 10*3/uL (ref 150–400)
RBC: 5.18 MIL/uL — ABNORMAL HIGH (ref 3.87–5.11)
RDW: 13.1 % (ref 11.5–15.5)
WBC: 12 10*3/uL — ABNORMAL HIGH (ref 4.0–10.5)
nRBC: 0 % (ref 0.0–0.2)

## 2021-03-11 LAB — ECHOCARDIOGRAM COMPLETE
Area-P 1/2: 2.54 cm2
S' Lateral: 2.4 cm

## 2021-03-11 LAB — HIV ANTIBODY (ROUTINE TESTING W REFLEX): HIV Screen 4th Generation wRfx: NONREACTIVE

## 2021-03-11 LAB — MAGNESIUM: Magnesium: 2.7 mg/dL — ABNORMAL HIGH (ref 1.7–2.4)

## 2021-03-11 LAB — TSH: TSH: 1.03 u[IU]/mL (ref 0.350–4.500)

## 2021-03-11 MED ORDER — ASPIRIN 81 MG PO CHEW
81.0000 mg | CHEWABLE_TABLET | Freq: Every day | ORAL | Status: DC
  Administered 2021-03-12 – 2021-03-17 (×7): 81 mg via ORAL
  Filled 2021-03-11 (×6): qty 1

## 2021-03-11 MED ORDER — ENOXAPARIN SODIUM 40 MG/0.4ML IJ SOSY
40.0000 mg | PREFILLED_SYRINGE | INTRAMUSCULAR | Status: DC
  Administered 2021-03-12 – 2021-03-17 (×6): 40 mg via SUBCUTANEOUS
  Filled 2021-03-11 (×6): qty 0.4

## 2021-03-11 MED ORDER — ENOXAPARIN SODIUM 40 MG/0.4ML IJ SOSY
40.0000 mg | PREFILLED_SYRINGE | Freq: Every day | INTRAMUSCULAR | Status: DC
  Administered 2021-03-11: 40 mg via SUBCUTANEOUS

## 2021-03-11 MED ORDER — ENOXAPARIN SODIUM 40 MG/0.4ML IJ SOSY: 40.0000 mg | PREFILLED_SYRINGE | INTRAMUSCULAR | Status: DC

## 2021-03-11 MED ORDER — ATORVASTATIN CALCIUM 40 MG PO TABS
40.0000 mg | ORAL_TABLET | Freq: Every day | ORAL | Status: DC
  Administered 2021-03-12 – 2021-03-16 (×6): 40 mg via ORAL
  Filled 2021-03-11 (×6): qty 1

## 2021-03-11 NOTE — Procedures (Signed)
Patient Name: Brandy Nguyen  MRN: [id number]  Epilepsy Attending: Charlsie Quest  Referring Physician/Provider: Jimmye Norman, NP Date: 03/11/2021 Duration: 24.30 mins  Patient history: 55 year old female with right MCA infarct with hemorrhagic transformation with first-time seizure.  EEG General weakness.  Level of alertness: Awake  AEDs during EEG study: Keppra  Technical aspects: This EEG study was done with scalp electrodes positioned according to the 10-20 International system of electrode placement. Electrical activity was acquired at a sampling rate of 500Hz  and reviewed with a high frequency filter of 70Hz  and a low frequency filter of 1Hz . EEG data were recorded continuously and digitally stored.   Description: The posterior dominant rhythm consists of 8-9 Hz activity of moderate voltage (25-35 uV) seen predominantly in posterior head regions, asymmetric ( R<L) and reactive to eye opening and eye closing. EEG showed continuous 3 to 6 Hz theta-delta slowing in right hemisphere, maximal right frontal region. Hyperventilation and photic stimulation were not performed.     ABNORMALITY -Continuous slow, right hemisphere, maximal right frontal region  IMPRESSION: This study is suggestive of cortical dysfunction in right hemisphere, maximal right frontal region likely due to underlying stroke.  No seizures or definite epileptiform discharges were seen throughout the recording.  [name] 

## 2021-03-11 NOTE — Progress Notes (Signed)
Family Medicine Teaching Service Daily Progress Note Intern Pager: [phone]  Patient name: Brandy Nguyen Medical record number: [id number] Date of birth: March 15, 1966 Age: 55 y.o. Gender: female  Primary Care Provider: Patient, No Pcp Per (Inactive) Consultants: Neuro Code Status: Full  Pt Overview and Major Events to Date:  6/7: admitted  Assessment and Plan:  Brandy Nguyen is a 55 y.o female presenting with left-sided weakness and seizure-like activity.  PMH is significant for HTN and tobacco use.  Hemorrhagic CVA Patient presented w/left sided weakness and seizure like activity. MRI on admission showed large acute right posterior MCA infarct with extensive petechial hemorrhage and regional mass-effect.  Serial CT scans without significant change. UDS positive for cocaine. Echo, A1c, EEG pending. Lipid panel with LDL 142. TSH wnl. Nonfocal neuro exam this am. -Neuro following, appreciate recommendations -Goal systolic BP 130-160, diastolic<80 per neuro -Hydralazine 5 mg prn for BP >160 -Low threshold to initiate Cleviprex drip and transfer to ICU if BP uncontrolled -Neurochecks q2h -SLP, PT, OT -Holding statin for now (due to bleed), but will need high intensity statin in the future -Bedrest per neuro -f/u EEG, echo, A1c results -Smoking/Cocaine cessation  Seizure-like activity Possibly related to her large hemorrhagic stroke. No further seizure-like activity since admission. -Neuro following, appreciate recommendations -s/p Keppra load in ED -Continue Keppra 500mg  IV q12h -EEG pending -Seizure precautions  HTN BP improved after hydralazine x1. Most recently 110-120s systolic. Not on any home meds. -Goal BP 130-160 systolic, <80 diastolic -Hydralazine 5mg  prn for BP >160  Prolonged QTc QTc 580 on admission. -Repeat EKG tomorrow am -Avoid QTc prolonging agents  Tobacco use disorder -Smoking cessation counseling   FEN/GI: NPO PPx: SCDs (active  bleed)   Status is: Inpatient Remains inpatient appropriate because:Altered mental status and Inpatient level of care appropriate due to severity of illness   Dispo: The patient is from: Home              Anticipated d/c is to: TBD, ?CIR              Patient currently is not medically stable to d/c.   Difficult to place patient No    Subjective:  Patient complains of headache this morning. States it's because she is hungry. Asking to eat. No other complaints at this time.  Objective: Temp:  [97.8 F (36.6 C)-98.9 F (37.2 C)] 97.8 F (36.6 C) (06/08 0317) Pulse Rate:  [64-103] 103 (06/08 0517) Resp:  [15-22] 19 (06/08 0517) BP: (116-185)/(60-119) 117/81 (06/08 0517) SpO2:  [94 %-99 %] 95 % (06/08 0517) Physical Exam: General: Alert, resting comfortably, NAD Cardiovascular: RRR, normal S1/S2 without m/r/g Respiratory: normal effort, lungs CTAB Abdomen: soft, nontender, nondistended Extremities: no peripheral edema Neuro: CN II-XII intact, 5/5 strength in all extremities, sensation intact throughout, normal finger-to-nose  Laboratory: Recent Labs  Lab 03/10/21 1100 03/11/21 0500  WBC 13.2* 12.0*  HGB 17.7* 16.3*  HCT 52.2* 47.3*  PLT 250 266   Recent Labs  Lab 03/10/21 1223 03/11/21 0500  NA 139 136  K 3.3* 4.1  CL 114* 104  CO2 18* 23  BUN 8 7  CREATININE 0.77 1.10*  CALCIUM 6.8* 9.2  PROT 4.7* 6.7  BILITOT 0.6 0.8  ALKPHOS 53 69  ALT 14 17  AST 30 24  GLUCOSE 97 113*     Imaging/Diagnostic Tests: CT Angio Head W or Wo Contrast Result Date: 03/10/2021 IMPRESSION: No large vessel occlusion. Noncalcified plaque causing less than 50% stenosis at the right  ICA origin. Electronically Signed   By: Guadlupe Spanish M.D.   On: 03/10/2021 17:17   CT HEAD WO CONTRAST Result Date: 03/11/2021 IMPRESSION: 1. Continued interval evolution of moderate-sized right MCA distribution infarct, relatively stable in size and distribution from previous. Associated regional  mass effect with trace 2 mm right-to-left shift, not significantly changed. Similar associated petechial hemorrhage without frank hemorrhagic transformation. 2. No other new acute intracranial abnormality. Electronically Signed   By: Rise Mu M.D.   On: 03/11/2021 04:09   CT HEAD WO CONTRAST Result Date: 03/10/2021 IMPRESSION: 1. No significant interval change in size and distribution of acute to subacute right MCA distribution infarct, with similar associated areas of petechial hemorrhage. No frank intraparenchymal hematoma or hemorrhagic transformation. Associated localized edema with regional mass effect with trace 2 mm right-to-left shift, stable. No hydrocephalus or trapping. 2. No other new acute intracranial abnormality. Electronically Signed   By: Rise Mu M.D.   On: 03/10/2021 21:33   CT HEAD WO CONTRAST Result Date: 03/10/2021 IMPRESSION: Concern for 2.5 cm right frontoparietal mass with surrounding edema. Recommend further evaluation with MRI. These results were called by telephone at the time of interpretation on 03/10/2021 at 11:57 am to provider [facility] , who verbally acknowledged these results. Electronically Signed   By: Charlett Nose M.D.   On: 03/10/2021 11:58   CT Angio Neck W and/or Wo Contrast Result Date: 03/10/2021 IMPRESSION: No large vessel occlusion. Noncalcified plaque causing less than 50% stenosis at the right ICA origin. Electronically Signed   By: Guadlupe Spanish M.D.   On: 03/10/2021 17:17   MR Brain W and Wo Contrast Result Date: 03/10/2021 IMPRESSION: 1. Findings most consistent with a large acute or early subacute right posterior MCA territory infarct with exuberant cortical edema, extensive petechial hemorrhage, and gyriform enhancement likely relating to blood brain barrier breakdown. Sulcal/regional mass effect without midline shift. Recommend continued attention on follow-up imaging to ensure expected evolution and exclude underlying mass.  2. Possible diminished right MCA flow void, which could relate to slice selection versus underlying stenosis. Recommend CTA to further evaluate. Findings and recommendations discussed with Dr. Anitra Lauth via telephone at 2:58 PM. Electronically Signed   By: Feliberto Harts MD   On: 03/10/2021 15:05     Maury Dus, MD 03/11/2021, 6:37 AM PGY-1, [facility] Family Medicine FPTS Intern pager: [phone], text pages welcome

## 2021-03-11 NOTE — Progress Notes (Signed)
EEG complete - results pending 

## 2021-03-11 NOTE — Progress Notes (Signed)
PT Cancellation Note  Patient Details Name: Brandy Nguyen MRN: [id number] DOB: 1966/01/26   Cancelled Treatment:    Reason Eval/Treat Not Completed: Active bedrest order Patient currently with active bedrest orders until Friday 6/8. PT will re-attempt evaluation as time allows.   Kariana Wiles A. Dan Humphreys PT, DPT Acute Rehabilitation Services Pager [phone] Office [phone]    Viviann Spare 03/11/2021, 11:24 AM

## 2021-03-11 NOTE — Hospital Course (Addendum)
  Brandy Nguyen is a 55 year old female who presented with left sided weakness and seizure-like activity and was found to have acute hemorrhagic CVA. PMH significant for HTN and tobacco use.  Acute Hemorrhagic CVA Patient presented with L sided weakness and seizure-like activity witnessed by family. MRI on admission showed large acute right posterior MCA infarct with extensive petechial hemorrhage and regional mass effect (no midline shift). Serial CT scans were stable. Neuro followed the patient throughout her admission and she was initiated on ASA and statin.  Stroke workup remarkable for cocaine+, LDL 142. Echo showed EF 70-75% with hyperdynamic LV function. A1c, TSH unremarkable.  Seizure-like activity EEG showed cortical slowing from underlying stroke, no seizures or epileptiform discharges. She was started on Keppra per neuro  HTN Initially very hypertensive, required 1 dose of IV hydralazine. Subsequently normotensive without meds. Long term BP goal is normotensive.   Follow up items: 30 day cardiac event monitor at discharge per neuro Amb referral to neurology at discharge (for stroke f/u)

## 2021-03-11 NOTE — Progress Notes (Signed)
FPTS Interim Progress Note  Patient sleeping and resting comfortably.  Rounded with primary RN. Reports patient has some confusion with left side weakness. BP elevated earlier and responded well to Hydralazine 5 mg IV.  No other concerns voiced.  No orders required.  Appreciated nightly round.  Today's Vitals   03/10/21 2338 03/11/21 0028 03/11/21 0117 03/11/21 0317  BP: (!) 147/80  135/67 116/60  Pulse: 74  78 93  Resp: (!) 22  17 (!) 21  Temp: 98.2 F (36.8 C)  98 F (36.7 C) 97.8 F (36.6 C)  TempSrc: Oral  Oral Oral  SpO2: 99%  96% 94%  PainSc:  0-No pain     Repeat CT scheduled for 3 am.   Continue frequent vital and neuro checks.  Dana Allan, MD 03/11/2021, 3:37 AM PGY-2, [facility] Family Medicine Service pager [phone]

## 2021-03-11 NOTE — Evaluation (Signed)
Occupational Therapy Evaluation Patient Details Name: Brandy Nguyen MRN: [id number] DOB: 10-26-1965 Today's Date: 03/11/2021    History of Present Illness 55 y/o female presented to ED on 6/7 following reported possible seizure activity, visual changes, L facial droop and numbess, and L arm incoordination. CT head with concern for 2.5 cm R frontoparietal mass with edema. MRI found large acute or early subacute R posterior MCA infarct with hemorrhage and possible diminished R MCA flow void. PMH: HTN, tobacco use.   Clinical Impression   Patient admitted with the diagnosis above.  PTA she lives with her SO, who is able to provide 24 hour assist as needed, in a one level apartment.  She was independent with all mobility and ADL/IADL completion.  Neither of them drive, and they take the bus to their destination.  Barriers are listed below.  The patient is needing up to Mod A for ADL completion at a sit/stand level, and min A with basic mobility.  The patient would benefit from CIR level therapy given her prior level of function, motivation, and ability to participate with 3+ hours of rehab.  OT will follow in the acute setting to maximize functional while addressing LUE motor and sensory deficits, as well as L hemianopsia impacting mobility.     Follow Up Recommendations  CIR    Equipment Recommendations  Tub/shower bench    Recommendations for Other Services Rehab consult     Precautions / Restrictions Precautions Precautions: Fall Precaution Comments: L heminopsia Restrictions Weight Bearing Restrictions: No      Mobility Bed Mobility Overal bed mobility: Needs Assistance Bed Mobility: Supine to Sit;Sit to Supine     Supine to sit: Supervision Sit to supine: Supervision     Patient Response: Cooperative  Transfers Overall transfer level: Needs assistance   Transfers: Sit to/from Stand;Stand Pivot Transfers Sit to Stand: Min assist Stand pivot transfers: Min assist             Balance Overall balance assessment: Needs assistance Sitting-balance support: Feet supported Sitting balance-Leahy Scale: Fair     Standing balance support: Single extremity supported Standing balance-Leahy Scale: Fair Standing balance comment: patient needing gait belt, occasional hand held assist, and frequent cueing for visual field cut, and directional cues.                           ADL either performed or assessed with clinical judgement   ADL Overall ADL's : Needs assistance/impaired     Grooming: Wash/dry hands;Wash/dry face;Min guard;Standing           Upper Body Dressing : Moderate assistance;Standing Upper Body Dressing Details (indicate cue type and reason): L visual field cut impacts. Lower Body Dressing: Minimal assistance;Sit to/from stand               Functional mobility during ADLs: Minimal assistance       Vision   Vision Assessment?: Yes Alignment/Gaze Preference: Gaze right Tracking/Visual Pursuits: Requires cues, head turns, or add eye shifts to track Visual Fields: Left homonymous hemianopsia Depth Perception: Undershoots     Perception     Praxis      Pertinent Vitals/Pain Pain Assessment: No/denies pain     Hand Dominance Right   Extremity/Trunk Assessment Upper Extremity Assessment Upper Extremity Assessment: LUE deficits/detail LUE Deficits / Details: 3+/5 MMT grossly to LUE LUE Sensation: decreased light touch LUE Coordination: decreased fine motor;decreased gross motor   Lower Extremity Assessment Lower Extremity Assessment:  Defer to PT evaluation   Cervical / Trunk Assessment Cervical / Trunk Assessment: Normal   Communication Communication Communication: No difficulties   Cognition Arousal/Alertness: Awake/alert Behavior During Therapy: WFL for tasks assessed/performed Overall Cognitive Status: Impaired/Different from baseline Area of Impairment: Orientation;Attention;Following  commands;Problem solving;Awareness                 Orientation Level: Person;Place;Time;Situation Current Attention Level: Selective   Following Commands: Follows one step commands with increased time   Awareness: Emergent Problem Solving: Slow processing;Requires verbal cues General Comments: patient scored 6/28 on the short blessed: indicating a mild but noticable decline to cognition.  Mild forgetfulness, mild impairment in problem solving and struggles with complex thought.   General Comments       Exercises     Shoulder Instructions      Home Living Family/patient expects to be discharged to:: Private residence Living Arrangements: Spouse/significant other Available Help at Discharge: Friend(s);Available 24 hours/day Type of Home: Apartment Home Access: Stairs to enter [employer] of Steps: 4 Entrance Stairs-Rails: Left Home Layout: One level     Bathroom Shower/Tub: Chief Strategy Officer: Standard Bathroom Accessibility: Yes How Accessible: Accessible via walker Home Equipment: None          Prior Functioning/Environment Level of Independence: Independent                 OT Problem List: Impaired vision/perception;Impaired balance (sitting and/or standing);Decreased range of motion;Decreased coordination;Decreased safety awareness      OT Treatment/Interventions: Self-care/ADL training;Therapeutic exercise;Balance training;Patient/family education;Visual/perceptual remediation/compensation;Therapeutic activities    OT Goals(Current goals can be found in the care plan section) Acute Rehab OT Goals Patient Stated Goal: I need to get my vision back OT Goal Formulation: With patient Time For Goal Achievement: 03/25/21 Potential to Achieve Goals: Good ADL Goals Pt Will Perform Grooming: with set-up;sitting;standing Pt Will Perform Lower Body Bathing: with set-up;sit to/from stand Pt Will Perform Lower Body Dressing: with  set-up;sit to/from stand Pt Will Transfer to Toilet: with modified independence;ambulating;regular height toilet  OT Frequency: Min 2X/week   Barriers to D/C:    none noted       Co-evaluation              AM-PAC OT "6 Clicks" Daily Activity     Outcome Measure Help from another person eating meals?: A Little Help from another person taking care of personal grooming?: A Little Help from another person toileting, which includes using toliet, bedpan, or urinal?: A Little Help from another person bathing (including washing, rinsing, drying)?: A Lot Help from another person to put on and taking off regular upper body clothing?: A Lot Help from another person to put on and taking off regular lower body clothing?: A Little 6 Click Score: 16   End of Session Equipment Utilized During Treatment: Gait belt Nurse Communication: Mobility status  Activity Tolerance: Patient tolerated treatment well Patient left: in bed;with call bell/phone within reach;with bed alarm set  OT Visit Diagnosis: Unsteadiness on feet (R26.81);Muscle weakness (generalized) (M62.81)                Time: 0947-0962 OT Time Calculation (min): 16 min Charges:  OT General Charges $OT Visit: 1 Visit OT Evaluation $OT Eval Moderate Complexity: 1 Mod  03/11/2021  Rich, OTR/L  Acute Rehabilitation Services  Office:  [phone]   Suzanna Obey 03/11/2021, 3:53 PM

## 2021-03-11 NOTE — Progress Notes (Signed)
Physical Therapy Treatment Patient Details Name: Brandy Nguyen MRN: [id number] DOB: 1966/06/12 Today's Date: 03/11/2021    History of Present Illness 55 y/o female presented to ED on 6/7 following reported possible seizure activity, visual changes, L facial droop and numbess, and L arm incoordination. CT head with concern for 2.5 cm R frontoparietal mass with edema. MRI found large acute or early subacute R posterior MCA infarct with hemorrhage and possible diminished R MCA flow void. PMH: HTN, tobacco use.    PT Comments    PTA, patient lives with significant other and reports independence with mobility. Patient uses public transportation. Patient presents with impaired balance, generalized weakness, decreased activity tolerance, impaired sensation, visual deficit, and impaired coordination. Patient requiring minA for mobility with no AD with LOB x 2 during ambulation. Cues required throughout for visual scanning of environment and directional cueing with intermittent follow through. Patient will benefit from skilled PT services during acute stay to address listed deficits. Recommend CIR for intensive therapies to assist with return to PLOF as patient was independent PTA and is demonstrating functional decline.     Follow Up Recommendations  CIR     Equipment Recommendations  Other (comment) (TBD)    Recommendations for Other Services Rehab consult     Precautions / Restrictions Precautions Precautions: Fall Precaution Comments: L heminopsia Restrictions Weight Bearing Restrictions: No    Mobility  Bed Mobility Overal bed mobility: Needs Assistance Bed Mobility: Sit to Supine     Supine to sit: Supervision Sit to supine: Supervision   General bed mobility comments: supervision for safety    Transfers Overall transfer level: Needs assistance Equipment used: None Transfers: Sit to/from Stand Sit to Stand: Min assist Stand pivot transfers: Min assist       General  transfer comment: minA to rise and steady  Ambulation/Gait Ambulation/Gait assistance: Min assist Gait Distance (Feet): 150 Feet Assistive device: None Gait Pattern/deviations: Step-through pattern;Decreased stride length;Decreased weight shift to left;Drifts right/left Gait velocity: decreasd   General Gait Details: LOB x 2 during ambulation with minA to recover. Cues for scanning L environment due to visual deficit with intermittent follow through. Running into objects on L with no awareness or correction. Difficulty wayfinding room number on L side due to decreased ability to compensate for visual deficit. Min-modA for directional cueing and balance throughout ambulation   Stairs Stairs: Yes Stairs assistance: Min assist Stair Management: One rail Left;Alternating pattern;Forwards Number of Stairs: 2 General stair comments: minA for balance. Cues to scan L environment to find rail   Wheelchair Mobility    Modified Rankin (Stroke Patients Only) Modified Rankin (Stroke Patients Only) Pre-Morbid Rankin Score: No symptoms Modified Rankin: Moderately severe disability     Balance Overall balance assessment: Needs assistance Sitting-balance support: Feet supported Sitting balance-Leahy Scale: Fair     Standing balance support: No upper extremity supported Standing balance-Leahy Scale: Poor Standing balance comment: requires minA for ambulation, frequent cueing for visual field cut and directional cues                            Cognition Arousal/Alertness: Awake/alert Behavior During Therapy: WFL for tasks assessed/performed Overall Cognitive Status: Impaired/Different from baseline Area of Impairment: Attention;Following commands;Problem solving;Awareness                 Orientation Level: Person;Place;Time;Situation Current Attention Level: Sustained   Following Commands: Follows one step commands with increased time;Follows one step commands  inconsistently  Awareness: Emergent Problem Solving: Slow processing;Requires verbal cues;Requires tactile cues General Comments: Difficulty problem solving with visual deficit. Requires verbal and tactile cueing. Decreased awareness of deficits with running into objects on L after being consistently cued for scanning environment.      Exercises      General Comments        Pertinent Vitals/Pain Pain Assessment: No/denies pain    Home Living Family/patient expects to be discharged to:: Private residence Living Arrangements: Spouse/significant other Available Help at Discharge: Friend(s);Available 24 hours/day Type of Home: Apartment Home Access: Stairs to enter Entrance Stairs-Rails: Left Home Layout: One level Home Equipment: None      Prior Function Level of Independence: Independent          PT Goals (current goals can now be found in the care plan section) Acute Rehab PT Goals Patient Stated Goal: I need to get my vision back PT Goal Formulation: With patient Time For Goal Achievement: 03/25/21 Potential to Achieve Goals: Good    Frequency    Min 4X/week      PT Plan      Co-evaluation              AM-PAC PT "6 Clicks" Mobility   Outcome Measure  Help needed turning from your back to your side while in a flat bed without using bedrails?: A Little Help needed moving from lying on your back to sitting on the side of a flat bed without using bedrails?: A Little Help needed moving to and from a bed to a chair (including a wheelchair)?: A Little Help needed standing up from a chair using your arms (e.g., wheelchair or bedside chair)?: A Little Help needed to walk in hospital room?: A Little Help needed climbing 3-5 steps with a railing? : A Little 6 Click Score: 18    End of Session Equipment Utilized During Treatment: Gait belt Activity Tolerance: Patient tolerated treatment well Patient left: in bed;with call bell/phone within reach;with bed  alarm set Nurse Communication: Mobility status PT Visit Diagnosis: Unsteadiness on feet (R26.81);Muscle weakness (generalized) (M62.81);Other symptoms and signs involving the nervous system (R29.898);Difficulty in walking, not elsewhere classified (R26.2)     Time: 2778-2423 PT Time Calculation (min) (ACUTE ONLY): 14 min  Charges:                        Milla Wahlberg A. Dan Humphreys PT, DPT Acute Rehabilitation Services Pager [phone] Office [phone]    Viviann Spare 03/11/2021, 4:27 PM

## 2021-03-11 NOTE — Progress Notes (Addendum)
STROKE TEAM PROGRESS NOTE    Interval History   No acute events overnight per nursing. She has been made NPO by her primary team, swallow evaluation ordered via SLP to evaluate swallow function. Patient states left lower extremity weakness is unchanged.  She has no new complaints. Pertinent Lab Work and Imaging    03/10/21 CT Head WO IV Contrast Concern for 2.5 cm right frontoparietal mass with surrounding edema. Recommend further evaluation with MRI.  03/10/21 CT Angio Head and Neck W WO IV Contrast CTA HEAD  Anterior circulation: Intracranial internal carotid arteries are patent anterior cerebral arteries are patent. 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 Middle cerebral arteries are patent.  Posterior circulation: Intracranial vertebral arteries are patent. Basilar artery is patent. Superior cerebellar artery origins are patent. Posterior cerebral arteries are patent. Bilateral posterior communicating arteries are present with essentially fetal origin of the PCAs.  CTA NECK  Aortic arch: Great vessel origins are patent.  Right carotid system: Patent. There is eccentric noncalcified plaque along the proximal internal carotid causing less than 50% stenosis.  Left carotid system: Patent.  No stenosis.  Vertebral arteries: Patent. Codominant. There is early extracranial origin of bilateral PICAs at C1-C2.  03/10/21 MRI Brain WO IV Contrast 1. Findings most consistent with a large acute or early subacute right posterior MCA territory infarct with exuberant cortical edema, extensive petechial hemorrhage, and gyriform enhancement likely relating to blood brain barrier breakdown. Sulcal/regional mass effect without midline shift. Recommend continued attention on follow-up imaging to ensure expected evolution and exclude underlying mass. 2. Possible diminished right MCA flow void, which could relate to slice selection versus underlying stenosis. Recommend  CTA to further Evaluate.  03/10/21 CT Head  1. No significant interval change in size and distribution of acute to subacute right MCA distribution infarct, with similar associated areas of petechial hemorrhage. No frank intraparenchymal hematoma or hemorrhagic transformation. Associated localized edema with regional mass effect with trace 2 mm right-to-left shift, stable. No hydrocephalus or trapping. 2. No other new acute intracranial abnormality.  03/11/21 CT Head WO Contrast  1. Continued interval evolution of moderate-sized right MCA distribution infarct, relatively stable in size and distribution from previous. Associated regional mass effect with trace 2 mm right-to-left shift, not significantly changed. Similar associated petechial hemorrhage without frank hemorrhagic transformation. 2. No other new acute intracranial abnormality.  03/11/21 Echocardiogram Complete  1. Left ventricular ejection fraction, by estimation, is 70 to 75%. The left ventricle has hyperdynamic function. The left ventricle has no regional wall motion abnormalities. There is mild left ventricular hypertrophy. Left ventricular diastolic parameters are consistent with Grade I diastolic dysfunction (impaired relaxation).  2. Right ventricular systolic function is normal. The right ventricular  size is normal.  3. The mitral valve is normal in structure. No evidence of mitral valve  regurgitation. No evidence of mitral stenosis.  4. The aortic valve is normal in structure. Aortic valve regurgitation is  not visualized. Mild to moderate aortic valve sclerosis/calcification is  present, without any evidence of aortic stenosis.  5. The inferior vena cava is normal in size with greater than 50%  respiratory variability, suggesting right atrial pressure of 3 mmHg.   Conclusion(s)/Recommendation(s): No intracardiac source of embolism  detected on this transthoracic study. A transesophageal echocardiogram is  recommended to exclude cardiac source of embolism if clinically indicated.   Physical Examination  Pleasant frail middle-aged African-American lady not in distress Constitutional: African American female resting in bed, cooperative with physical examination  Cardiovascular: Normal RR Respiratory: No increased WOB   Mental status: AAOx4, following commands  Speech: Fluent with repetition and naming intact. Sounds slightly dysarthric, per patient her speech is normal  Cranial nerves: Right gaze preference but able to cross midline, L HH, At rest face symmetric, with smiling slight decreased right nasolabial fold, Tongue midline, shoulder shrug intact  Motor: Normal bulk and tone. Slight pronator drift to the LUE.  Weakness of left grip.  Orbits right over left upper extremity.  Fine finger movements are diminished on the left.  Dlt Bic Tri FgS Grp HF  KnF KnE PIF DoF  R 5 5 5 5 5 5 5 5 5 5   L 5 5 5 5 5 5 5 5 5 5   Sensory: Decreased to light touch to the left arm and left leg  Coordination: Intact FNF  Gait: Slow but steady, witnessed ambulating to the bathroom   NIHSS:  1a Level of Conscious: 0  1b LOC Questions: 0 1c LOC Commands: 0 2 Best Gaze: 1 3 Visual: 2 4 Facial Palsy: 1 5a Motor Arm - Left: 1 5b Motor Arm - Right: 0 6a Motor Leg - Left: 0 6b Motor Leg - Right: 0 7 Limb Ataxia: 0 8 Sensory: 1 9 Best Language: 0 10 Dysarthria: 0 11 Extinct. and Inatten: 0 TOTAL: 6  Assessment and Plan   Brandy Nguyen is a 55 y.o. female w/pmh of tobacco abuse who presents with left sided weakness and seizure activity, found to have a R MCA stroke.   #R MCA Stroke w/Petechial Hemorrhage + Cortical Edema  Patient presented with the symptoms described above. Stroke work up is ongoing at this time. Her MRI Brain showed a R MCA stroke with exuberant cortical edema and extensive petechial hemorrhage. Repeat CTH were done given edema and petechial hemorrhage- these images were stable.  CTA Head and Neck was pertinent for noncalcified plaque causing less than 50 % stenosis at the right ICA origin. Echo revealed EF 70 to 75 % w/hyperdynamic LV function, no intracardiac source of embolism detected on TTE. Stroke labs were completed including lipid panel w/LDL 142 and hemoglobin A1C- results pending. UDS was positive for cocaine. Stroke etiology is cryptogenic at this time; could be related to vasospasm in the setting of cocaine use versus cardioembolic cause.  - She will need a 30 day cardiac event monitor at discharge to evaluate for atrial fibrillation  - Aspirin 81 mg and Atorvastatin 40 mg initiated for secondary stroke prevention ( orders placed today) Ok for Aspirin given stability of petechial hemorrhage on imaging  - Order STAT CTH for any neurological exam change given edema + petechial hemorrhage -At discharge please place ambulatory referral to neurology for stroke follow up  #Seizure  Noted to have seizure activity by family; given R MCA stroke w/petechial hemorrhage she is at risk to seize. EEG done 6/8 with cortical dysfunction in the right hemisphere maximal right frontal region due to underlying stroke.  - Continue Keppra 500 Q12 IV, change to oral when able and discharge her on this. Keppra can be tapered off on an outpatient basis in the future if indicated   #Eleavted Blood Pressures no Diagnosis of HTN  No documented history of hypertension, blood pressures yesterday notably elevated in the 150-180 range. Given extensive petechial hemorrhage within the stroke bed recommend SBP goal < 160 acutely, long term goal is normotension. Of note, she is outside of the permissive HTN window given sx started 3  days prior to admission and has no severe stenosis on vessel imaging that would warrant a more liberal BP goal.  - Long term SBP goal normotensive, maintain pressures < 160 in the acute setting given extensive petechial hemorrhage  - Ok for primary team to initiate oral BP  regimen if indicated at this time and to treat BP with IV PRNS to maintain SBP < 160   #Hyperlipidemia From a stroke prevention stand point, the LDL goal is < 70. LDL 142. Atorvastatin 40 mg initiated 03/11/21 for stroke prevention purposes.    #Diabetes Screening  Hemoglobin A1C pending, will follow up on this   #DVT Prophylaxis  Ok for primary team to initiate DVT Prophylaxis w/agent of choice given stability of petechial hemorrhage on imaging   Hospital day # 1  Stark Jock, NP  [facility] Nurse Practitioner Patient seen and discussed with attending physician Dr. Pearlean Brownie  I have personally obtained history,examined this patient, reviewed notes, independently viewed imaging studies, participated in medical decision making and plan of care.ROS completed by me personally and pertinent positives fully documented  I have made any additions or clarifications directly to the above note. Agree with note above.  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  Recommend aspirin alone for stroke prevention and continue ongoing stroke work-up.  Statin for elevated lipids.  Speech therapy for swallow eval with physical occupational therapy and inpatient rehab consults.  Continue Keppra for seizures.  No family available at the bedside for discussion.  Greater than 50% time during this 35-minute visit was spent in counseling and coordination of care about the stroke and discussion of stroke prevention.  Patient counseled to quit cocaine and substance abuse and is agreeable.  Delia Heady, MD Medical Director [facility] Pager: [phone] 03/11/2021 3:44 PM   To contact Stroke Continuity provider, please refer to [email or web address]. After hours, contact General Neurology

## 2021-03-11 NOTE — Progress Notes (Signed)
Echocardiogram 2D Echocardiogram has been performed.  Brandy Nguyen Meklit Cotta 03/11/2021, 9:34 AM

## 2021-03-11 NOTE — TOC CAGE-AID Note (Signed)
Transition of Care [facility]) - CAGE-AID Screening   Patient Details  Name: Brandy Nguyen MRN: [id number] Date of Birth: [birth year]  [facility] of [facility]) CM/SW Contact:    Kermit Balo, RN Phone Number: 03/11/2021, 2:27 PM   Clinical Narrative: Patient does admit to cocaine use and accepted information on counseling: inpatient and outpatient for substance abuse.   CAGE-AID Screening:    Have You Ever Felt You Ought to Cut Down on Your Drinking or Drug Use?: Yes Have People Annoyed You By Critizing Your Drinking Or Drug Use?: No Have You Felt Bad Or Guilty About Your Drinking Or Drug Use?: Yes Have You Ever Had a Drink or Used Drugs First Thing In The Morning to Steady Your Nerves or to Get Rid of a Hangover?: No CAGE-AID Score: 2  Substance Abuse Education Offered: Yes  Substance abuse interventions: Patient Counseling,Educational Materials

## 2021-03-11 NOTE — Evaluation (Signed)
Clinical/Bedside Swallow Evaluation Patient Details  Name: Brandy Nguyen MRN: [id number] Date of Birth: 1966-06-10  Today's Date: 03/11/2021 Time: SLP Start Time (ACUTE ONLY): 1340 SLP Stop Time (ACUTE ONLY): 1350 SLP Time Calculation (min) (ACUTE ONLY): 10 min  Past Medical History:  Past Medical History:  Diagnosis Date  . Hyperlipidemia 03/11/2021   Past Surgical History: No past surgical history on file. HPI:  Brandy Nguyen is a 55 y.o female presenting with left-sided weakness and seizure-like activity.  PMH is significant for HTN and tobacco use. MRI on admission showed large acute right posterior MCA infarct with extensive petechial hemorrhage and regional mass-effect   Assessment / Plan / Recommendation Clinical Impression  Pt demonstrates normal swallowing. No signs of aspiration of dysphagia. Pt may benefit from set up assist with meals given left neglect. Will initaite regular diet and thin liquids. SLP Visit Diagnosis: Dysphagia, unspecified (R13.10)    Aspiration Risk  Mild aspiration risk    Diet Recommendation Regular;Thin liquid   Liquid Administration via: Cup;Straw Medication Administration: Whole meds with liquid Supervision: Patient able to self feed Compensations: Slow rate;Small sips/bites    Other  Recommendations Oral Care Recommendations: Oral care BID   Follow up Recommendations Inpatient Rehab (cognition)      Frequency and Duration            Prognosis        Swallow Study   General HPI: Brandy Nguyen is a 55 y.o female presenting with left-sided weakness and seizure-like activity.  PMH is significant for HTN and tobacco use. MRI on admission showed large acute right posterior MCA infarct with extensive petechial hemorrhage and regional mass-effect Type of Study: Bedside Swallow Evaluation Diet Prior to this Study: NPO History of Recent Intubation: No Behavior/Cognition: Alert;Cooperative;Pleasant mood Oral Cavity Assessment: Within  Functional Limits Oral Care Completed by SLP: No Self-Feeding Abilities: Able to feed self Patient Positioning: Upright in bed    Oral/Motor/Sensory Function Overall Oral Motor/Sensory Function: Within functional limits   Ice Chips     Thin Liquid Thin Liquid: Within functional limits    Nectar Thick Nectar Thick Liquid: Not tested   Honey Thick Honey Thick Liquid: Not tested   Puree Puree: Within functional limits   Solid     Solid: Within functional limits     Brandy Ditty, MA CCC-SLP  Acute Rehabilitation Services Pager [phone] Office [phone]  Claudine Mouton 03/11/2021,1:55 PM

## 2021-03-11 NOTE — TOC Initial Note (Signed)
Transition of Care [facility]) - Initial/Assessment Note    Patient Details  Name: Brandy Nguyen MRN: [id number] Date of Birth: 01-14-1966  Transition of Care [facility]) CM/SW Contact:    Kermit Balo, RN Phone Number: 03/11/2021, 2:30 PM  Clinical Narrative:                 Patient lives with her significant other. He is able to provide intermittent supervision. They deny issues with transportation. Patient states she is not taking any home meds currently.  Pt is without insurance and no PCP. She was interested in getting an appointment with one of the [facility]. Appt made with [facility] with information on the AVS. Information for [facility] pharmacy place on AVS for assistance with her meds.  Awaiting PT/OT evals. TOC following.  Expected Discharge Plan: Home/Self Care Barriers to Discharge: Continued Medical Work up   Patient Goals and CMS Choice        Expected Discharge Plan and Services Expected Discharge Plan: Home/Self Care   Discharge Planning Services: CM Consult   Living arrangements for the past 2 months: Apartment                                      Prior Living Arrangements/Services Living arrangements for the past 2 months: Apartment Lives with:: Significant Other Patient language and need for interpreter reviewed:: Yes Do you feel safe going back to the place where you live?: Yes        Care giver support system in place?: Yes (comment)   Criminal Activity/Legal Involvement Pertinent to Current Situation/Hospitalization: No - Comment as needed  Activities of Daily Living      Permission Sought/Granted                  Emotional Assessment Appearance:: Appears stated age Attitude/Demeanor/Rapport: Engaged Affect (typically observed): Accepting Orientation: : Oriented to Self,Oriented to Place,Oriented to Situation Alcohol / Substance Use: Illicit Drugs Psych Involvement: No (comment)  Admission diagnosis:  Seizure (HCC)  [R56.9] CVA (cerebral vascular accident) (HCC) [I63.9] Cerebrovascular accident (CVA), unspecified mechanism (HCC) [I63.9] Patient Active Problem List   Diagnosis Date Noted  . Dysarthria 03/11/2021  . Mild mental slowing 03/11/2021  . Pure hypercholesterolemia 03/11/2021  . Polycythemia 03/11/2021  . Leukocytosis 03/11/2021  . Dehydration 03/11/2021  . Cerebral edema (HCC) 03/11/2021  . CVA (cerebral vascular accident) (HCC) 03/10/2021  . Seizure (HCC)   . Cocaine abuse (HCC)   . Tobacco abuse   . Left leg weakness   . Left-sided neglect    PCP:  Patient, No Pcp Per (Inactive) Pharmacy:  No Pharmacies Listed    Social Determinants of Health (SDOH) Interventions    Readmission Risk Interventions No flowsheet data found.

## 2021-03-12 LAB — BASIC METABOLIC PANEL
Anion gap: 9 (ref 5–15)
BUN: 10 mg/dL (ref 6–20)
CO2: 24 mmol/L (ref 22–32)
Calcium: 9.1 mg/dL (ref 8.9–10.3)
Chloride: 104 mmol/L (ref 98–111)
Creatinine, Ser: 1.07 mg/dL — ABNORMAL HIGH (ref 0.44–1.00)
GFR, Estimated: >60 mL/min (ref >60)
Glucose, Bld: 110 mg/dL — ABNORMAL HIGH (ref 70–99)
Potassium: 3.9 mmol/L (ref 3.5–5.1)
Sodium: 137 mmol/L (ref 135–145)

## 2021-03-12 LAB — HEMOGLOBIN A1C
Hgb A1c MFr Bld: 5.3 % (ref 4.8–5.6)
Mean Plasma Glucose: 105 mg/dL

## 2021-03-12 MED ORDER — PANTOPRAZOLE SODIUM 40 MG PO TBEC
40.0000 mg | DELAYED_RELEASE_TABLET | Freq: Every day | ORAL | Status: DC
  Administered 2021-03-12 – 2021-03-16 (×5): 40 mg via ORAL
  Filled 2021-03-12 (×5): qty 1

## 2021-03-12 MED ORDER — LEVETIRACETAM 500 MG PO TABS
500.0000 mg | ORAL_TABLET | Freq: Two times a day (BID) | ORAL | Status: DC
  Administered 2021-03-12 – 2021-03-17 (×11): 500 mg via ORAL
  Filled 2021-03-12 (×11): qty 1

## 2021-03-12 MED ORDER — NICOTINE 21 MG/24HR TD PT24
21.0000 mg | MEDICATED_PATCH | Freq: Every day | TRANSDERMAL | Status: DC
  Administered 2021-03-12 – 2021-03-13 (×2): 21 mg via TRANSDERMAL
  Filled 2021-03-12 (×2): qty 1

## 2021-03-12 NOTE — Progress Notes (Signed)
Inpatient Rehab Admissions Coordinator:   Met with pt at bedside to discuss CIR goals and expectations.  We discussed that length of stay would likely be relatively short, given current level of function, but we would also most likely recommend she has someone with her at discharge.  She states that she plans to d/c home with her significant other Remo Lipps).  I did ask her if she felt safe discharging with him, given the events of this morning, and she affirmed that she did.  I will try to contact him tomorrow. I also discussed cost of CIR with her.  She asked to be screened for Medicaid so I will refer her to [facility] for that.    Of note: I will not have a bed for this pt to admit to CIR until sometime next week.  If she continues to progress she may be able to discharge home.  I will continue to follow.   Shann Medal, PT, DPT Admissions Coordinator [phone] 03/12/21  4:41 PM

## 2021-03-12 NOTE — Progress Notes (Signed)
Physical Therapy Treatment Patient Details Name: Brandy Nguyen MRN: [id number] DOB: May 29, 1966 Today's Date: 03/12/2021    History of Present Illness 55 y/o female presented to ED on 6/7 following reported possible seizure activity, visual changes, L facial droop and numbess, and L arm incoordination. CT head with concern for 2.5 cm R frontoparietal mass with edema. MRI found large acute or early subacute R posterior MCA infarct with hemorrhage and possible diminished R MCA flow void. PMH: HTN, tobacco use.    PT Comments    Patient progressing towards physical therapy goals. Continues to be limited by L visual field deficit and decreased awareness. Constant cueing for visual scanning and directional cues during ambulation. Requires minA for ambulation with no AD. Performed dynamic reaching tasks with visual scanning component requiring cues to attend to L side. Continue to recommend comprehensive inpatient rehab (CIR) for post-acute therapy needs.     Follow Up Recommendations  CIR     Equipment Recommendations  Other (comment) (TBD)    Recommendations for Other Services       Precautions / Restrictions Precautions Precautions: Fall Precaution Comments: L heminopsia Restrictions Weight Bearing Restrictions: No    Mobility  Bed Mobility Overal bed mobility: Needs Assistance Bed Mobility: Sit to Supine       Sit to supine: Supervision   General bed mobility comments: supervision for safety    Transfers Overall transfer level: Needs assistance Equipment used: None Transfers: Sit to/from Stand Sit to Stand: Min guard         General transfer comment: min guard for safety  Ambulation/Gait Ambulation/Gait assistance: Min assist Gait Distance (Feet): 200 Feet Assistive device: None Gait Pattern/deviations: Step-through pattern;Decreased stride length;Decreased weight shift to left;Drifts right/left Gait velocity: decreasd   General Gait Details: MinA for  preventing LOB during ambulation. Difficulty wayfinding back to room with decreased awareness to room numbers on L side. Cues for scanning environment and directional cues   Stairs             Wheelchair Mobility    Modified Rankin (Stroke Patients Only) Modified Rankin (Stroke Patients Only) Pre-Morbid Rankin Score: No symptoms Modified Rankin: Moderately severe disability     Balance Overall balance assessment: Needs assistance Sitting-balance support: Feet supported Sitting balance-Leahy Scale: Fair     Standing balance support: No upper extremity supported;During functional activity Standing balance-Leahy Scale: Poor Standing balance comment: requires minA for balance during ambulation and dynamic reaching tasks at sink                            Cognition Arousal/Alertness: Awake/alert Behavior During Therapy: WFL for tasks assessed/performed Overall Cognitive Status: Impaired/Different from baseline Area of Impairment: Attention;Following commands;Problem solving;Awareness                   Current Attention Level: Sustained   Following Commands: Follows one step commands with increased time;Follows one step commands inconsistently   Awareness: Emergent Problem Solving: Slow processing;Requires verbal cues;Requires tactile cues General Comments: Required frequent cueing for scanning L environment. Decreased awareness of safety and deficits with cues required for preventing running into objects      Exercises Other Exercises Other Exercises: visual scanning at mirror with targets and required dynamic reaching with minA for balance    General Comments        Pertinent Vitals/Pain Pain Assessment: No/denies pain    Home Living  Prior Function            PT Goals (current goals can now be found in the care plan section) Acute Rehab PT Goals Patient Stated Goal: I need to get my vision back PT Goal  Formulation: With patient Time For Goal Achievement: 03/25/21 Potential to Achieve Goals: Good Progress towards PT goals: Progressing toward goals    Frequency    Min 4X/week      PT Plan Current plan remains appropriate    Co-evaluation              AM-PAC PT "6 Clicks" Mobility   Outcome Measure  Help needed turning from your back to your side while in a flat bed without using bedrails?: A Little Help needed moving from lying on your back to sitting on the side of a flat bed without using bedrails?: A Little Help needed moving to and from a bed to a chair (including a wheelchair)?: A Little Help needed standing up from a chair using your arms (e.g., wheelchair or bedside chair)?: A Little Help needed to walk in hospital room?: A Little Help needed climbing 3-5 steps with a railing? : A Little 6 Click Score: 18    End of Session Equipment Utilized During Treatment: Gait belt Activity Tolerance: Patient tolerated treatment well Patient left: in bed;with call bell/phone within reach;with bed alarm set Nurse Communication: Mobility status PT Visit Diagnosis: Unsteadiness on feet (R26.81);Muscle weakness (generalized) (M62.81);Other symptoms and signs involving the nervous system (R29.898);Difficulty in walking, not elsewhere classified (R26.2)     Time: 1535-1600 PT Time Calculation (min) (ACUTE ONLY): 25 min  Charges:  $Gait Training: 8-22 mins $Therapeutic Activity: 8-22 mins                     Brandy Nguyen A. Dan Humphreys PT, DPT Acute Rehabilitation Services Pager [phone] Office [phone]    Viviann Spare 03/12/2021, 5:13 PM

## 2021-03-12 NOTE — Progress Notes (Signed)
STROKE TEAM PROGRESS NOTE    Interval History  Patient is walking in the room.  She states she is doing much better is improved.  She wants to go home but therapist recommend inpatient rehab.  No more seizures neurological exam is improved. No acute events overnight per nursing   she has no new complaints. Pertinent Lab Work and Imaging    03/10/21 CT Head WO IV Contrast Concern for 2.5 cm right frontoparietal mass with surrounding edema. Recommend further evaluation with MRI.  03/10/21 CT Angio Head and Neck W WO IV Contrast CTA HEAD   Anterior circulation: Intracranial internal carotid arteries are patent anterior cerebral arteries are patent. 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 Middle cerebral arteries are patent.   Posterior circulation: Intracranial vertebral arteries are patent. Basilar artery is patent. Superior cerebellar artery origins are patent. Posterior cerebral arteries are patent. Bilateral posterior communicating arteries are present with essentially fetal origin of the PCAs.  CTA NECK   Aortic arch: Great vessel origins are patent.   Right carotid system: Patent. There is eccentric noncalcified plaque along the proximal internal carotid causing less than 50% stenosis.   Left carotid system: Patent.  No stenosis.   Vertebral arteries: Patent. Codominant. There is early extracranial origin of bilateral PICAs at C1-C2.  03/10/21 MRI Brain WO IV Contrast 1. Findings most consistent with a large acute or early subacute right posterior MCA territory infarct with exuberant cortical edema, extensive petechial hemorrhage, and gyriform enhancement likely relating to blood brain barrier breakdown. Sulcal/regional mass effect without midline shift. Recommend continued attention on follow-up imaging to ensure expected evolution and exclude underlying mass. 2. Possible diminished right MCA flow void, which could relate to slice selection versus  underlying stenosis. Recommend CTA to further Evaluate.  03/10/21 CT Head  1. No significant interval change in size and distribution of acute to subacute right MCA distribution infarct, with similar associated areas of petechial hemorrhage. No frank intraparenchymal hematoma or hemorrhagic transformation. Associated localized edema with regional mass effect with trace 2 mm right-to-left shift, stable. No hydrocephalus or trapping. 2. No other new acute intracranial abnormality.  03/11/21 CT Head WO Contrast  1. Continued interval evolution of moderate-sized right MCA distribution infarct, relatively stable in size and distribution from previous. Associated regional mass effect with trace 2 mm right-to-left shift, not significantly changed. Similar associated petechial hemorrhage without frank hemorrhagic transformation. 2. No other new acute intracranial abnormality.  03/11/21 Echocardiogram Complete   1. Left ventricular ejection fraction, by estimation, is 70 to 75%. The left ventricle has hyperdynamic function. The left ventricle has no regional wall motion abnormalities. There is mild left ventricular hypertrophy. Left ventricular diastolic parameters are consistent with Grade I diastolic dysfunction (impaired relaxation).   2. Right ventricular systolic function is normal. The right ventricular  size is normal.   3. The mitral valve is normal in structure. No evidence of mitral valve  regurgitation. No evidence of mitral stenosis.   4. The aortic valve is normal in structure. Aortic valve regurgitation is  not visualized. Mild to moderate aortic valve sclerosis/calcification is  present, without any evidence of aortic stenosis.   5. The inferior vena cava is normal in size with greater than 50%  respiratory variability, suggesting right atrial pressure of 3 mmHg.   Conclusion(s)/Recommendation(s): No intracardiac source of embolism  detected on this transthoracic study. A  transesophageal echocardiogram is recommended to exclude cardiac source of embolism if clinically indicated.   Physical Examination  Pleasant frail middle-aged African-American lady not in distress Constitutional: African American female resting in bed, cooperative with physical examination  Cardiovascular: Normal RR Respiratory: No increased WOB   Mental status: AAOx4, following commands  Speech: Fluent with repetition and naming intact. Sounds slightly dysarthric, per patient her speech is normal  Cranial nerves: Right gaze preference but able to cross midline, L HH, At rest face symmetric, with smiling slight decreased right nasolabial fold, Tongue midline, shoulder shrug intact  Motor: Normal bulk and tone. Slight pronator drift to the LUE.  Weakness of left grip.  Orbits right over left upper extremity.  Fine finger movements are diminished on the left.  Dlt Bic Tri FgS Grp HF  KnF KnE PIF DoF  R 5 5 5 5 5 5 5 5 5 5   L 5 5 5 5 5 5 5 5 5 5   Sensory: Decreased to light touch to the left arm and left leg  Coordination: Intact FNF  Gait: Slow but steady, witnessed ambulating to the bathroom   NIHSS:  1a Level of Conscious: 0  1b LOC Questions: 0 1c LOC Commands: 0 2 Best Gaze: 0 3 Visual: 2 4 Facial Palsy: 1 5a Motor Arm - Left: 0 5b Motor Arm - Right: 0 6a Motor Leg - Left: 0 6b Motor Leg - Right: 0 7 Limb Ataxia: 0 8 Sensory: 1 9 Best Language: 0 10 Dysarthria: 0 11 Extinct. and Inatten: 0 TOTAL: 4.  Middle syncopal  Assessment and Plan   Ms. ESBEYDI MANAGO is a 55 y.o. female w/pmh of tobacco abuse who presents with left sided weakness and seizure activity, found to have a R MCA stroke.   #R MCA Stroke w/Petechial Hemorrhage + Cortical Edema  Patient presented with the symptoms described above. Stroke work up is ongoing at this time. Her MRI Brain showed a R MCA stroke with exuberant cortical edema and extensive petechial hemorrhage. Repeat CTH were done given edema  and petechial hemorrhage- these images were stable. CTA Head and Neck was pertinent for noncalcified plaque causing less than 50 % stenosis at the right ICA origin. Echo revealed EF 70 to 75 % w/hyperdynamic LV function, no intracardiac source of embolism detected on TTE. Stroke labs were completed including lipid panel w/LDL 142 and hemoglobin A1C- results pending. UDS was positive for cocaine. Stroke etiology is cryptogenic at this time; could be related to vasospasm in the setting of cocaine use versus cardioembolic cause.  - She will need a 30 day cardiac event monitor at discharge to evaluate for atrial fibrillation  - Aspirin 81 mg and Atorvastatin 40 mg initiated for secondary stroke prevention ( orders placed today) Ok for Aspirin given stability of petechial hemorrhage on imaging  - Order STAT CTH for any neurological exam change given edema + petechial hemorrhage -At discharge please place ambulatory referral to neurology for stroke follow up  #Seizure  Noted to have seizure activity by family; given R MCA stroke w/petechial hemorrhage she is at risk to seize. EEG done 6/8 with cortical dysfunction in the right hemisphere maximal right frontal region due to underlying stroke.  - Continue Keppra 500 Q12 IV, change to oral when able and discharge her on this. Keppra can be tapered off on an outpatient basis in the future if indicated   #Eleavted Blood Pressures no Diagnosis of HTN  No documented history of hypertension, blood pressures yesterday notably elevated in the 150-180 range. Given extensive petechial hemorrhage within the stroke bed recommend SBP  goal < 160 acutely, long term goal is normotension. Of note, she is outside of the permissive HTN window given sx started 3 days prior to admission and has no severe stenosis on vessel imaging that would warrant a more liberal BP goal.  - Long term SBP goal normotensive, maintain pressures < 160 in the acute setting given extensive petechial  hemorrhage  - Ok for primary team to initiate oral BP regimen if indicated at this time and to treat BP with IV PRNS to maintain SBP < 160   #Hyperlipidemia From a stroke prevention stand point, the LDL goal is < 70. LDL 142. Atorvastatin 40 mg initiated 03/11/21 for stroke prevention purposes.    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  Recommend aspirin alone for stroke prevention and continue ongoing stroke work-up.  Statin for elevated lipids.  Continue ongoing therapies.  Continue Keppra for seizures.  No family available at the bedside for discussion.  Greater than 50% time during this 2 5-minute visit was spent in counseling and coordination of care about the stroke and discussion of stroke prevention.  Patient counseled to quit cocaine and substance abuse and is agreeable.  Stroke team will sign off.  Kindly call for questions.  Delia Heady, MD Medical Director [facility] Pager: [phone] 03/12/2021 12:52 PM   To contact Stroke Continuity provider, please refer to [email or web address]. After hours, contact General Neurology

## 2021-03-12 NOTE — Progress Notes (Addendum)
Family Medicine Teaching Service Daily Progress Note Intern Pager: [phone]  Patient name: Brandy Nguyen Medical record number: [id number] Date of birth: [birth year] Age: 55 y.o. Gender: female  Primary Care Provider: Patient, No Pcp Per (Inactive) Consultants: Neurology Code Status: Full  Pt Overview and Major Events to Date:  6/7: admitted, neuro consulted  Assessment and Plan:  Brandy Nguyen is a 55 year old female who presented with left sided weakness and seizure-like activity and was found to have acute hemorrhagic CVA. PMH significant for HTN and tobacco use.  Acute Hemorrhagic CVA MRI on admission showed large acute right posterior MCA infarct with extensive petechial hemorrhage. Serial CT scans stable. Stroke workup remarkable for UDS +cocaine and LDL 142. A1c wnl at 5.3% and echo showed EF 70-75% with hyperdynamic LV function. Neuro exam is normal. -Neuro following, appreciate recommendations -ASA 81mg  daily -Atorvastatin 40mg  daily -Goal systolic BP 130-160, diastolic <80 -Long term BP goal normotensive -PT/OT  Seizure-Like Activity EEG showed cortical dysfunction in right hemisphere due to underlying stroke, but no seizure or epileptiform discharges. -Continue Keppra (will change to PO today)  HTN Normotensive over past 24h. Most recent BP 109/74. Not on any home meds -Monitor BP  HLD Lipid panel with LDL 142, total chol 234 -Atorvastatin 40mg  daily  Tobacco/Cocaine Use Patient is interested in smoking cessation. Currently smokes 1/2 to 1 PPD.  Inquiring about nicotine patch. Admits it will be difficult to quit because her boyfriend smokes and also uses cocaine. -Needs PCP -Nicotine patch -Ongoing cessation counseling  Prolonged QTc QTc borderline on am EKG (486) -Avoid QT prolonging agents  FEN/GI: Regular diet PPx: Lovenox   Status is: Inpatient Remains inpatient appropriate because: awaiting CIR eval  Dispo: The patient is from: Home               Anticipated d/c is to: CIR              Patient currently is medically stable to d/c.   Difficult to place patient No     Subjective:  No acute events overnight. Patient denies complaints this morning.  Objective: Temp:  [97.9 F (36.6 C)-99 F (37.2 C)] 98.8 F (37.1 C) (06/08 2000) Pulse Rate:  [63-84] 63 (06/08 1558) Resp:  [14-20] 18 (06/08 1558) BP: (103-124)/(66-84) 103/66 (06/08 2000) SpO2:  [97 %-100 %] 99 % (06/08 1558) Weight:  [64.5 kg] 64.5 kg (06/08 1452) Physical Exam: General: alert, resting comfortably in bed, NAD Cardiovascular: RRR, normal S1/S2 without m/r/g Respiratory: Normal effort, normal CTAB Abdomen: Soft, nontender Extremities: WWP, no lower extremity edema Neuro: CN II-XII intact, 5/5 strength throughout bilateral upper and lower extremities, oriented x4, sensation intact throughout, normal speech  Laboratory: Recent Labs  Lab 03/10/21 1100 03/11/21 0500  WBC 13.2* 12.0*  HGB 17.7* 16.3*  HCT 52.2* 47.3*  PLT 250 266   Recent Labs  Lab 03/10/21 1223 03/11/21 0500 03/12/21 0400  NA 139 136 137  K 3.3* 4.1 3.9  CL 114* 104 104  CO2 18* 23 24  BUN 8 7 10   CREATININE 0.77 1.10* 1.07*  CALCIUM 6.8* 9.2 9.1  PROT 4.7* 6.7  --   BILITOT 0.6 0.8  --   ALKPHOS 53 69  --   ALT 14 17  --   AST 30 24  --   GLUCOSE 97 113* 110*     Imaging/Diagnostic Tests: EEG adult Result Date: 03/11/2021 IMPRESSION: This study is suggestive of cortical dysfunction in right hemisphere, maximal right frontal  region likely due to underlying stroke.  No seizures or definite epileptiform discharges were seen throughout the recording. Charlsie Quest   ECHOCARDIOGRAM COMPLETE Result Date: 03/11/2021 IMPRESSIONS  1. Left ventricular ejection fraction, by estimation, is 70 to 75%. The left ventricle has hyperdynamic function. The left ventricle has no regional wall motion abnormalities. There is mild left ventricular hypertrophy. Left ventricular diastolic  parameters are consistent with Grade I diastolic dysfunction (impaired relaxation).  2. Right ventricular systolic function is normal. The right ventricular size is normal.  3. The mitral valve is normal in structure. No evidence of mitral valve regurgitation. No evidence of mitral stenosis.  4. The aortic valve is normal in structure. Aortic valve regurgitation is not visualized. Mild to moderate aortic valve sclerosis/calcification is present, without any evidence of aortic stenosis.  5. The inferior vena cava is normal in size with greater than 50% respiratory variability, suggesting right atrial pressure of 3 mmHg. Conclusion(s)/Recommendation(s): No intracardiac source of embolism detected on this transthoracic study. A transesophageal echocardiogram is recommended to exclude cardiac source of embolism if clinically indicated.    Maury Dus, MD 03/12/2021, 6:17 AM PGY-1, [facility] Family Medicine FPTS Intern pager: [phone], text pages welcome
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
## 2021-03-12 NOTE — Progress Notes (Signed)
   03/11/21 1502  SLP Visit Information  SLP Received On 03/12/21  SLP Time Calculation  SLP Start Time (ACUTE ONLY) 1330  SLP Stop Time (ACUTE ONLY) 1350  SLP Time Calculation (min) (ACUTE ONLY) 20 min  General Information  HPI Brandy Nguyen is a 55 y.o female presenting with left-sided weakness and seizure-like activity.  PMH is significant for HTN and tobacco use. MRI on admission showed large acute right posterior MCA infarct with extensive petechial hemorrhage and regional mass-effect  Prior Functional Status  Cognitive/Linguistic Baseline WFL  Type of Home Apartment   Lives With Significant other  Available Help at Discharge Friend(s);Available 24 hours/day  Vocation Unemployed  Oral Motor/Sensory Function  Overall Oral Motor/Sensory Function WFL  Cognition  Overall Cognitive Status Impaired/Different from baseline  Arousal/Alertness Awake/alert  Orientation Level Oriented to person;Oriented to place;Oriented to time;Oriented to situation  Attention Focused;Sustained;Selective  Focused Attention Appears intact  Sustained Attention Appears intact  Selective Attention Appears intact  Memory Appears intact  Awareness Impaired  Awareness Impairment Emergent impairment;Anticipatory impairment  Problem Solving Impaired  Problem Solving Impairment Functional complex;Functional basic  Executive Function Self Monitoring  Self Monitoring Impaired  Self Monitoring Impairment Functional basic;Functional complex  Safety/Judgment Impaired  Auditory Comprehension  Overall Auditory Comprehension Appears within functional limits for tasks assessed  Verbal Expression  Overall Verbal Expression Appears within functional limits for tasks assessed  Written Expression  Dominant Hand Right  Motor Speech  Overall Motor Speech Appears within functional limits for tasks assessed  Assessment  Clinical Impression Statement (ACUTE ONLY) Pt demonstrates excellent speech and language abilities,  but her functional problem solving and awareness are significantly impaired, complicated by left inattention and visual deficit. With explanation of deficits pt can demonstrate an intellectual awareness, but still struggles with emergent awareness and self correction. She will benefit from further therapeutic interventions, though suspect that cognition would best be addressed through functional tasks with PT/OT. May benefit from higher level cognitive therapy with SLP at CIR level.  SLP Recommendation/Assessment Patient does not need any further Speech Lanaguage Pathology Services  No Skilled Speech Therapy All education completed  SLP Recommendations  Recommendations for Other Services Rehab consult  Follow up Recommendations Inpatient Rehab  Individuals Consulted  Consulted and Agree with Results and Recommendations Patient  SLP Evaluations  $ SLP Speech Visit 1 Visit  SLP Evaluations  $ SLP EVAL LANGUAGE/SOUND PRODUCTION 1 Procedure

## 2021-03-12 NOTE — Progress Notes (Signed)
FPTS Interim Progress Note  Patient sleeping and resting comfortably.  Rounded with primary RN.  Reports no worsening confusion or weakness. No concerns voiced.  No orders required.  Appreciated nightly round.  Today's Vitals   03/11/21 1452 03/11/21 1541 03/11/21 1558 03/11/21 2000  BP:  121/70 124/70 103/66  Pulse:  84 63   Resp:  14 18   Temp:   99 F (37.2 C) 98.8 F (37.1 C)  TempSrc:   Oral Oral  SpO2:  100% 99%   Weight: 64.5 kg     PainSc:       Continue to monitor. Low threshold to CT head for acute neuro changes  Dana Allan, MD 03/12/2021, 3:22 AM PGY-2, [facility] Family Medicine Service pager [phone]

## 2021-03-13 LAB — CBC
HCT: 41.1 % (ref 36.0–46.0)
Hemoglobin: 13.7 g/dL (ref 12.0–15.0)
MCH: 31 pg (ref 26.0–34.0)
MCHC: 33.3 g/dL (ref 30.0–36.0)
MCV: 93 fL (ref 80.0–100.0)
Platelets: 252 10*3/uL (ref 150–400)
RBC: 4.42 MIL/uL (ref 3.87–5.11)
RDW: 12.8 % (ref 11.5–15.5)
WBC: 5.8 10*3/uL (ref 4.0–10.5)
nRBC: 0 % (ref 0.0–0.2)

## 2021-03-13 LAB — BASIC METABOLIC PANEL
Anion gap: 9 (ref 5–15)
BUN: 9 mg/dL (ref 6–20)
CO2: 25 mmol/L (ref 22–32)
Calcium: 9.3 mg/dL (ref 8.9–10.3)
Chloride: 104 mmol/L (ref 98–111)
Creatinine, Ser: 0.98 mg/dL (ref 0.44–1.00)
GFR, Estimated: >60 mL/min (ref >60)
Glucose, Bld: 114 mg/dL — ABNORMAL HIGH (ref 70–99)
Potassium: 3.3 mmol/L — ABNORMAL LOW (ref 3.5–5.1)
Sodium: 138 mmol/L (ref 135–145)

## 2021-03-13 LAB — MAGNESIUM: Magnesium: 1.8 mg/dL (ref 1.7–2.4)

## 2021-03-13 MED ORDER — NICOTINE 14 MG/24HR TD PT24
14.0000 mg | MEDICATED_PATCH | Freq: Every day | TRANSDERMAL | Status: DC
  Administered 2021-03-14 – 2021-03-17 (×4): 14 mg via TRANSDERMAL
  Filled 2021-03-13 (×4): qty 1

## 2021-03-13 MED ORDER — NICOTINE POLACRILEX 2 MG MT LOZG
2.0000 mg | LOZENGE | OROMUCOSAL | Status: DC | PRN
  Filled 2021-03-13: qty 1

## 2021-03-13 MED ORDER — POTASSIUM CHLORIDE CRYS ER 20 MEQ PO TBCR
40.0000 meq | EXTENDED_RELEASE_TABLET | Freq: Once | ORAL | Status: AC
  Administered 2021-03-13: 40 meq via ORAL
  Filled 2021-03-13: qty 2

## 2021-03-13 MED ORDER — NICOTINE POLACRILEX 2 MG MT GUM
2.0000 mg | CHEWING_GUM | OROMUCOSAL | Status: DC | PRN
  Administered 2021-03-13: 2 mg via ORAL
  Filled 2021-03-13 (×3): qty 1
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
## 2021-03-13 NOTE — Progress Notes (Signed)
Occupational Therapy Treatment Patient Details Name: Brandy Nguyen MRN: [id number] DOB: 04/30/1966 Today's Date: 03/13/2021    History of present illness 55 y/o female presented to ED on 6/7 following reported possible seizure activity, visual changes, L facial droop and numbess, and L arm incoordination. CT head with concern for 2.5 cm R frontoparietal mass with edema. MRI found large acute or early subacute R posterior MCA infarct with hemorrhage and possible diminished R MCA flow void. PMH: HTN, tobacco use.   OT comments  This 55 yo female admitted with above moving well today but still at risk for falls due to left visual field cut. Worked with her on scanning left to right to find items with her still needing cues almost every time the task changed. We will continue to follow.  Follow Up Recommendations  CIR    Equipment Recommendations  None recommended by OT       Precautions / Restrictions Precautions Precautions: Fall Precaution Comments: L heminopsia Restrictions Weight Bearing Restrictions: No       Mobility Bed Mobility Overal bed mobility: Independent                  Transfers   Equipment used: None Transfers: Sit to/from Stand Sit to Stand: Independent         General transfer comment: min guard A for safety ambulating around hallways due to left visual field cut    Balance Overall balance assessment: Independent                                             Vision Patient Visual Report: No change from baseline Additional Comments: When entering room pt said she did not realize it was 1:14, I asked her to look at her clock on wall again and then she saw it was 11:14 at which point she said well my phone shows 1:14, I asked her to look at her phone again and then she was able to see that it actually read 11:14. At that time we talked about finding the left edge of whatever she was looking at and scanning to right from there.   Worked with pt on scanning at sink for items (she found everyone without issues).Then went down hall at bulletin board and she was ~50% accurate in finding all that I asked of her in a timely manner (the ones she missed and/or took increased time to find or needed VCs to find)--had to remind her initally to find the left hand side of the bulletin board. Once back in room I had pt read from a booklet she had and again she initially needed VCs to start on far left (edge). We talked about not driving and she asked about riding the bus (I told her this may still be a challenge due to curbs and things/people on the sidewalks). We also talked about safety in kitchen--she reports her counter is to the left of her stove so that is good since there is less of a likeihood she will inadvertantly touch a hot eye. We also talked about if she is cutting something with a sharp knife to not hold it with her left hand but rather use a fork to hold it.          Cognition Arousal/Alertness: Awake/alert Behavior During Therapy: WFL for tasks assessed/performed Overall Cognitive Status: Impaired/Different from baseline Area  of Impairment: Safety/judgement;Awareness;Problem solving                         Safety/Judgement: Decreased awareness of safety;Decreased awareness of deficits Awareness: Emergent Problem Solving: Requires verbal cues;Requires tactile cues                     Pertinent Vitals/ Pain       Pain Assessment: No/denies pain         Frequency  Min 2X/week        Progress Toward Goals  OT Goals(current goals can now be found in the care plan section)  Progress towards OT goals: Progressing toward goals  Acute Rehab OT Goals Patient Stated Goal: to be able to go back to work and not lose apartment OT Goal Formulation: With patient Time For Goal Achievement: 03/25/21 Potential to Achieve Goals: Good   AM-PAC OT "6 Clicks" Daily Activity     Outcome Measure   Help  from another person eating meals?: None Help from another person taking care of personal grooming?: A Little Help from another person toileting, which includes using toliet, bedpan, or urinal?: A Little Help from another person bathing (including washing, rinsing, drying)?: A Little Help from another person to put on and taking off regular upper body clothing?: A Little Help from another person to put on and taking off regular lower body clothing?: A Little 6 Click Score: 19    End of Session    OT Visit Diagnosis: Low vision, both eyes (H54.2)   Activity Tolerance Patient tolerated treatment well   Patient Left in chair;with call bell/phone within reach;with chair alarm set   Nurse Communication          Time: 2831-5176 OT Time Calculation (min): 55 min  Charges: OT General Charges $OT Visit: 1 Visit OT Treatments $Therapeutic Activity: 53-67 mins  Ignacia Palma, OTR/L Acute [employer] Pager [phone] Office [phone]     Evette Georges 03/13/2021, 5:28 PM

## 2021-03-13 NOTE — Plan of Care (Signed)
Pt worked with OT/PT today with success. PRN tylenol given for HA earlier today. No other complaints voiced at this time. BP 126/90 (BP Location: Right Arm)   Pulse 68   Temp 98.4 F (36.9 C) (Oral)   Resp 16   Wt 64.5 kg   SpO2 100%  Lloyd Huger

## 2021-03-13 NOTE — Progress Notes (Signed)
FPTS Interim Progress Note  Patient sleeping and resting comfortably.  Rounded with primary RN and reports confusion improving. No concerns voiced.  No orders required.  Appreciated nightly round.  Today's Vitals   03/12/21 1511 03/12/21 1948 03/12/21 2000 03/13/21 0014  BP: 121/73 (!) 144/95  109/67  Pulse: 67 69  62  Resp: 18 18  20   Temp: 98.4 F (36.9 C) 98.3 F (36.8 C)  99.7 F (37.6 C)  TempSrc: Oral Oral  Oral  SpO2: 98% 100%  100%  Weight:      PainSc:   0-No pain      , MD 03/13/2021, 3:57 AM PGY-2, [facility] Family Medicine Service pager [phone]

## 2021-03-13 NOTE — Progress Notes (Signed)
Physical Therapy Treatment Patient Details Name: NATASA STIGALL MRN: [id number] DOB: 1966-03-05 Today's Date: 03/13/2021    History of Present Illness 55 y/o female presented to ED on 6/7 following reported possible seizure activity, visual changes, L facial droop and numbess, and L arm incoordination. CT head with concern for 2.5 cm R frontoparietal mass with edema. MRI found large acute or early subacute R posterior MCA infarct with hemorrhage and possible diminished R MCA flow void. PMH: HTN, tobacco use.    PT Comments    Patient continues to be limited by visual field deficit and impaired balance. Patient requires minA during ambulation prevent LOB x 3 during obstacle negotiation and wayfinding with head turns. Requires constant cueing for wayfinding and visual scanning during ambulation. Extensive education on compensatory strategies for visual deficit and current functional status and encouragement given of progress thus far. Continue to recommend comprehensive inpatient rehab (CIR) for post-acute therapy needs.    Follow Up Recommendations  CIR     Equipment Recommendations  Other (comment) (TBD)    Recommendations for Other Services       Precautions / Restrictions Precautions Precautions: Fall Precaution Comments: L heminopsia Restrictions Weight Bearing Restrictions: No    Mobility  Bed Mobility Overal bed mobility: Independent                  Transfers Overall transfer level: Needs assistance Equipment used: None Transfers: Sit to/from Stand Sit to Stand: Supervision         General transfer comment: supervision for safety  Ambulation/Gait Ambulation/Gait assistance: Min assist Gait Distance (Feet): 200 Feet Assistive device: None Gait Pattern/deviations: Step-through pattern;Decreased stride length;Decreased weight shift to left;Drifts right/left Gait velocity: decreasd   General Gait Details: minA to prevent LOB x 3 during ambulation with  obstacle negotiation and wayfinding with head turns. Requires constant cueing for visual scanning environment and directional cueing   Stairs             Wheelchair Mobility    Modified Rankin (Stroke Patients Only) Modified Rankin (Stroke Patients Only) Pre-Morbid Rankin Score: No symptoms Modified Rankin: Moderately severe disability     Balance Overall balance assessment: Mild deficits observed, not formally tested                                          Cognition Arousal/Alertness: Awake/alert Behavior During Therapy: WFL for tasks assessed/performed Overall Cognitive Status: Impaired/Different from baseline Area of Impairment: Safety/judgement;Awareness;Problem solving                         Safety/Judgement: Decreased awareness of safety;Decreased awareness of deficits Awareness: Emergent Problem Solving: Requires verbal cues;Requires tactile cues General Comments: Required frequent cueing for scanning L environment. Decreased awareness of safety and deficits with cues required for preventing running into objects      Exercises      General Comments        Pertinent Vitals/Pain Pain Assessment: No/denies pain    Home Living                      Prior Function            PT Goals (current goals can now be found in the care plan section) Acute Rehab PT Goals Patient Stated Goal: to be able to go back to work and not  lose apartment PT Goal Formulation: With patient Time For Goal Achievement: 03/25/21 Potential to Achieve Goals: Good Progress towards PT goals: Progressing toward goals    Frequency    Min 4X/week      PT Plan Current plan remains appropriate    Co-evaluation              AM-PAC PT "6 Clicks" Mobility   Outcome Measure  Help needed turning from your back to your side while in a flat bed without using bedrails?: None Help needed moving from lying on your back to sitting on the  side of a flat bed without using bedrails?: None Help needed moving to and from a bed to a chair (including a wheelchair)?: A Little Help needed standing up from a chair using your arms (e.g., wheelchair or bedside chair)?: A Little Help needed to walk in hospital room?: A Little Help needed climbing 3-5 steps with a railing? : A Little 6 Click Score: 20    End of Session Equipment Utilized During Treatment: Gait belt Activity Tolerance: Patient tolerated treatment well Patient left: in bed;with call bell/phone within reach;with bed alarm set Nurse Communication: Mobility status PT Visit Diagnosis: Unsteadiness on feet (R26.81);Muscle weakness (generalized) (M62.81);Other symptoms and signs involving the nervous system (R29.898);Difficulty in walking, not elsewhere classified (R26.2)     Time: 2703-5009 PT Time Calculation (min) (ACUTE ONLY): 54 min  Charges:  $Gait Training: 38-52 mins $Therapeutic Activity: 8-22 mins                     Tanja Gift A. Dan Humphreys PT, DPT Acute Rehabilitation Services Pager [phone] Office [phone]    Viviann Spare 03/13/2021, 5:45 PM

## 2021-03-14 DIAGNOSIS — F70 Mild intellectual disabilities: Secondary | ICD-10-CM

## 2021-03-14 DIAGNOSIS — R471 Dysarthria and anarthria: Secondary | ICD-10-CM

## 2021-03-14 DIAGNOSIS — G936 Cerebral edema: Secondary | ICD-10-CM

## 2021-03-14 DIAGNOSIS — Z720 Tobacco use: Secondary | ICD-10-CM

## 2021-03-14 DIAGNOSIS — R29898 Other symptoms and signs involving the musculoskeletal system: Secondary | ICD-10-CM

## 2021-03-14 DIAGNOSIS — E860 Dehydration: Secondary | ICD-10-CM

## 2021-03-14 DIAGNOSIS — F1410 Cocaine abuse, uncomplicated: Secondary | ICD-10-CM

## 2021-03-14 DIAGNOSIS — E7800 Pure hypercholesterolemia, unspecified: Secondary | ICD-10-CM

## 2021-03-14 LAB — CBC
HCT: 42.1 % (ref 36.0–46.0)
Hemoglobin: 14.2 g/dL (ref 12.0–15.0)
MCH: 31.6 pg (ref 26.0–34.0)
MCHC: 33.7 g/dL (ref 30.0–36.0)
MCV: 93.6 fL (ref 80.0–100.0)
Platelets: 270 10*3/uL (ref 150–400)
RBC: 4.5 MIL/uL (ref 3.87–5.11)
RDW: 12.6 % (ref 11.5–15.5)
WBC: 6.3 10*3/uL (ref 4.0–10.5)
nRBC: 0 % (ref 0.0–0.2)

## 2021-03-14 LAB — BASIC METABOLIC PANEL
Anion gap: 11 (ref 5–15)
Anion gap: 9 (ref 5–15)
BUN: 10 mg/dL (ref 6–20)
BUN: 10 mg/dL (ref 6–20)
CO2: 25 mmol/L (ref 22–32)
CO2: 26 mmol/L (ref 22–32)
Calcium: 10 mg/dL (ref 8.9–10.3)
Calcium: 9.6 mg/dL (ref 8.9–10.3)
Chloride: 101 mmol/L (ref 98–111)
Chloride: 104 mmol/L (ref 98–111)
Creatinine, Ser: 1.07 mg/dL — ABNORMAL HIGH (ref 0.44–1.00)
Creatinine, Ser: 0.94 mg/dL (ref 0.44–1.00)
GFR, Estimated: >60 mL/min (ref >60)
GFR, Estimated: >60 mL/min (ref >60)
Glucose, Bld: 121 mg/dL — ABNORMAL HIGH (ref 70–99)
Glucose, Bld: 111 mg/dL — ABNORMAL HIGH (ref 70–99)
Potassium: 4.1 mmol/L (ref 3.5–5.1)
Potassium: 3.8 mmol/L (ref 3.5–5.1)
Sodium: 137 mmol/L (ref 135–145)
Sodium: 139 mmol/L (ref 135–145)
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
## 2021-03-14 NOTE — Progress Notes (Signed)
FPTS Interim Progress Note  Patient sleeping and resting comfortably.  Rounded with primary RN.  No concerns voiced.  No orders required.  Appreciated nightly round.  Today's Vitals   03/13/21 1504 03/13/21 2050 03/14/21 0018 03/14/21 0417  BP: 126/90 107/76 (!) 133/93 94/68  Pulse: 68 70 63 (!) 56  Resp: 16 18 19 20   Temp: 98.4 F (36.9 C) 98.3 F (36.8 C) 98.3 F (36.8 C) 97.7 F (36.5 C)  TempSrc: Oral Oral Oral Oral  SpO2: 100% 99% 99% 100%  Weight:      PainSc:  0-No pain       , MD 03/14/2021, 4:39 AM PGY-2, [facility] Family Medicine Service pager [phone]
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
## 2021-03-16 LAB — BASIC METABOLIC PANEL
Anion gap: 6 (ref 5–15)
BUN: 11 mg/dL (ref 6–20)
CO2: 28 mmol/L (ref 22–32)
Calcium: 9.5 mg/dL (ref 8.9–10.3)
Chloride: 103 mmol/L (ref 98–111)
Creatinine, Ser: 1.13 mg/dL — ABNORMAL HIGH (ref 0.44–1.00)
GFR, Estimated: 57 mL/min — ABNORMAL LOW (ref >60)
Glucose, Bld: 103 mg/dL — ABNORMAL HIGH (ref 70–99)
Potassium: 3.9 mmol/L (ref 3.5–5.1)
Sodium: 137 mmol/L (ref 135–145)
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
## 2021-03-16 NOTE — Progress Notes (Signed)
Occupational Therapy Treatment Patient Details Name: Brandy Nguyen MRN: [id number] DOB: [birth year] Today's Date: 03/16/2021    History of present illness 55 y/o female presented to ED on 6/7 following reported possible seizure activity, visual changes, L facial droop and numbess, and L arm incoordination. CT head with concern for 2.5 cm R frontoparietal mass with edema. MRI found large acute or early subacute R posterior MCA infarct with hemorrhage and possible diminished R MCA flow void. PMH: HTN, tobacco use.   OT comments  This 55 yo female admitted with above presents to acute OT making great gains overall with mobility and ADLs within her hospital room, needs more cuing/A for safety outside of her room. I do feel with intermittent A from family/friends she can live on her own in her apartment, but am concerned about community mobility (she takes the bus frequently for work and errands) and her ability to safely return to work (to be able to keep her apartment) given vision deficits. Feel CIR would be best for her to work on her IADLs and vision compensation as well as to get her connected with other services. We will continue to follow.   Follow Up Recommendations  CIR;Supervision - Intermittent    Equipment Recommendations  None recommended by OT       Precautions / Restrictions Precautions Precautions: Fall Precaution Comments: L hemianopsia Restrictions Weight Bearing Restrictions: No       Mobility Bed Mobility    General bed mobility comments: Pt up in recliner upon my arrival    Transfers Overall transfer level: Needs assistance Equipment used: None Transfers: Sit to/from Stand Sit to Stand: Supervision         General transfer comment: S to ambulate around the room to gather and set up items for bathing dressing    Balance Overall balance assessment: Mild deficits observed, not formally tested Sitting-balance support: Feet supported Sitting balance-Leahy  Scale: Fair     Standing balance support: No upper extremity supported;During functional activity Standing balance-Leahy Scale: Fair                       ADL either performed or assessed with clinical judgement   ADL Overall ADL's : Needs assistance/impaired                                       General ADL Comments: Had pt talk me through everything she would need if she was getting up in the morning, washing up, and getting dressed (she needed Min VCs to think through this). Then I had her get up and gather all the things she had mentioned and we talked about  and set them up where she would need them in her hosptial room (min VCs)     Vision   Additional Comments: Continues with left visual field cut          Cognition Arousal/Alertness: Awake/alert Behavior During Therapy: WFL for tasks assessed/performed Overall Cognitive Status: Impaired/Different from baseline Area of Impairment: Safety/judgement;Problem solving                   Safety/Judgement: Decreased awareness of safety;Decreased awareness of deficits  Problem Solving: Requires verbal cues                     Pertinent Vitals/ Pain       Pain Assessment: No/denies  pain         Frequency  Min 2X/week        Progress Toward Goals  OT Goals(current goals can now be found in the care plan section)  Progress towards OT goals: Progressing toward goals  Acute Rehab OT Goals Patient Stated Goal: to be able to go back to work and not lose apartment OT Goal Formulation: With patient Time For Goal Achievement: 03/25/21 Potential to Achieve Goals: Good  Plan Discharge plan remains appropriate       AM-PAC OT "6 Clicks" Daily Activity     Outcome Measure   Help from another person eating meals?: None Help from another person taking care of personal grooming?: A Little Help from another person toileting, which includes using toliet, bedpan, or urinal?: A  Little Help from another person bathing (including washing, rinsing, drying)?: A Little Help from another person to put on and taking off regular upper body clothing?: A Little Help from another person to put on and taking off regular lower body clothing?: A Little 6 Click Score: 19    End of Session    OT Visit Diagnosis: Low vision, both eyes (H54.2)   Activity Tolerance Patient tolerated treatment well   Patient Left in chair;with call bell/phone within reach;with chair alarm set           Time: 1436-1500 OT Time Calculation (min): 24 min  Charges: OT General Charges $OT Visit: 1 Visit OT Treatments $Self Care/Home Management : 23-37 mins  Ignacia Palma, OTR/L Acute [employer] Pager [phone] Office [phone]     Evette Georges 03/16/2021, 3:14 PM

## 2021-03-16 NOTE — Progress Notes (Signed)
FPTS Interim Progress Note  Patient sleeping and resting comfortably.  Rounded with primary RN.  No concerns voiced.  No orders required.  Appreciated nightly round.  Today's Vitals   03/15/21 1558 03/15/21 1940 03/15/21 2316 03/16/21 0317  BP: 125/80 104/60 (!) 101/52 126/79  Pulse: 63 62 (!) 58 62  Resp: 17 16 18 16   Temp: 97.9 F (36.6 C) 98.2 F (36.8 C) 98.3 F (36.8 C) 98.2 F (36.8 C)  TempSrc: Oral Oral Oral Oral  SpO2: 99% 98% 100% 100%  Weight:      PainSc:  0-No pain       , MD 03/16/2021, 4:56 AM PGY-2, [facility] pager [phone]

## 2021-03-16 NOTE — Progress Notes (Signed)
Physical Therapy Treatment Patient Details Name: Brandy Nguyen MRN: [id number] DOB: 1966-09-27 Today's Date: 03/16/2021    History of Present Illness 54 y/o female presented to ED on 6/7 following reported possible seizure activity, visual changes, L facial droop and numbess, and L arm incoordination. CT head with concern for 2.5 cm R frontoparietal mass with edema. MRI found large acute or early subacute R posterior MCA infarct with hemorrhage and possible diminished R MCA flow void. PMH: HTN, tobacco use.    PT Comments    Pt progressing towards physical therapy goals. Visual deficits continue to impact safety with functional mobility. Pt scored 10/24 on the DGI this date, indicating she is at a higher risk for falls. HHA provided throughout the entirety of the session, however feel she would have felt more comfortable with the RW, though unwilling to try with it. CIR continues to be the most appropriate d/c disposition, and pt is very motivated to work with therapies and begin her rehab journey. Will continue to follow acutely.    Follow Up Recommendations  CIR     Equipment Recommendations  Other (comment) (TBD)    Recommendations for Other Services Rehab consult     Precautions / Restrictions Precautions Precautions: Fall Precaution Comments: L hemianopsia Restrictions Weight Bearing Restrictions: No    Mobility  Bed Mobility Overal bed mobility: Needs Assistance Bed Mobility: Supine to Sit     Supine to sit: Supervision     General bed mobility comments: Light supervision for safety with HOB flat. Increased time to initiate after PT asked her to go ahead and sit up.    Transfers Overall transfer level: Needs assistance Equipment used: None Transfers: Sit to/from Stand Sit to Stand: Min guard         General transfer comment: Hands on guarding for safety as pt powered up to full stand. Pt stood EOB with R hand on bed rail and eyes closed for several minutes.  Required cues to open eyes. Pt denies dizziness, stating she is just trying to get her balance. No unsteadiness or overt LOB noted during this time, and pt asking if she looks to be swaying side to side.  Ambulation/Gait Ambulation/Gait assistance: Min assist Gait Distance (Feet): 200 Feet Assistive device: 1 person hand held assist Gait Pattern/deviations: Step-through pattern;Decreased stride length;Decreased weight shift to left;Drifts right/left Gait velocity: decreasd Gait velocity interpretation: <1.31 ft/sec, indicative of household ambulator General Gait Details: Pt ambulating very slow and guarded with occasional unsteadiness requiring min assist to recover. She was able to improve gait speed with cues however appeared uncomfortable, like she was rushing. With the addition of head turns pt's gait speed decreased even more.   Stairs             Wheelchair Mobility    Modified Rankin (Stroke Patients Only) Modified Rankin (Stroke Patients Only) Pre-Morbid Rankin Score: No symptoms Modified Rankin: Moderately severe disability     Balance Overall balance assessment: Mild deficits observed, not formally tested Sitting-balance support: Feet supported Sitting balance-Leahy Scale: Fair     Standing balance support: No upper extremity supported;During functional activity Standing balance-Leahy Scale: Poor Standing balance comment: requires minA for balance during ambulation and dynamic reaching tasks at sink                 Standardized Balance Assessment Standardized Balance Assessment : Dynamic Gait Index   Dynamic Gait Index Level Surface: Moderate Impairment Change in Gait Speed: Moderate Impairment Gait with Horizontal Head Turns:  Moderate Impairment Gait with Vertical Head Turns: Moderate Impairment Gait and Pivot Turn: Mild Impairment Step Over Obstacle: Moderate Impairment Step Around Obstacles: Moderate Impairment Steps: Mild Impairment Total Score:  10      Cognition Arousal/Alertness: Awake/alert Behavior During Therapy: WFL for tasks assessed/performed Overall Cognitive Status: Impaired/Different from baseline Area of Impairment: Safety/judgement;Awareness;Problem solving                 Orientation Level: Time Current Attention Level: Sustained   Following Commands: Follows multi-step commands inconsistently;Follows multi-step commands with increased time;Follows one step commands consistently;Follows one step commands with increased time Safety/Judgement: Decreased awareness of safety;Decreased awareness of deficits Awareness: Emergent Problem Solving: Requires verbal cues;Requires tactile cues        Exercises Other Exercises Other Exercises: visual scanning at mirror with targets and required dynamic reaching with minA for balance    General Comments        Pertinent Vitals/Pain Pain Assessment: No/denies pain    Home Living                      Prior Function            PT Goals (current goals can now be found in the care plan section) Acute Rehab PT Goals Patient Stated Goal: to be able to go back to work and not lose apartment PT Goal Formulation: With patient Time For Goal Achievement: 03/25/21 Potential to Achieve Goals: Good Progress towards PT goals: Progressing toward goals    Frequency    Min 4X/week      PT Plan Current plan remains appropriate    Co-evaluation              AM-PAC PT "6 Clicks" Mobility   Outcome Measure  Help needed turning from your back to your side while in a flat bed without using bedrails?: None Help needed moving from lying on your back to sitting on the side of a flat bed without using bedrails?: A Little Help needed moving to and from a bed to a chair (including a wheelchair)?: A Little Help needed standing up from a chair using your arms (e.g., wheelchair or bedside chair)?: A Little Help needed to walk in hospital room?: A  Little Help needed climbing 3-5 steps with a railing? : A Little 6 Click Score: 19    End of Session Equipment Utilized During Treatment: Gait belt Activity Tolerance: Patient tolerated treatment well Patient left: in chair;with call bell/phone within reach;with chair alarm set Nurse Communication: Mobility status PT Visit Diagnosis: Unsteadiness on feet (R26.81);Muscle weakness (generalized) (M62.81);Other symptoms and signs involving the nervous system (R29.898);Difficulty in walking, not elsewhere classified (R26.2)     Time: 9211-9417 PT Time Calculation (min) (ACUTE ONLY): 26 min  Charges:  $Gait Training: 8-22 mins $Physical Performance Test: 8-22 mins                     Conni Slipper, PT, DPT Acute Rehabilitation Services Pager: [phone] Office: [phone]    Marylynn Pearson 03/16/2021, 12:54 PM

## 2021-03-17 ENCOUNTER — Encounter (HOSPITAL_COMMUNITY): Payer: Self-pay | Admitting: Physical Medicine and Rehabilitation

## 2021-03-17 ENCOUNTER — Other Ambulatory Visit: Payer: Self-pay

## 2021-03-17 ENCOUNTER — Inpatient Hospital Stay (HOSPITAL_COMMUNITY)
Admission: RE | Admit: 2021-03-17 | Discharge: 2021-03-24 | DRG: 057 | Disposition: A | Discharge status: Home / Self Care | Payer: Self-pay | Source: Intra-hospital | Attending: Physical Medicine and Rehabilitation | Admitting: Physical Medicine and Rehabilitation

## 2021-03-17 DIAGNOSIS — Z6829 Body mass index (BMI) 29.0-29.9, adult: Secondary | ICD-10-CM

## 2021-03-17 DIAGNOSIS — E785 Hyperlipidemia, unspecified: Secondary | ICD-10-CM | POA: Diagnosis present

## 2021-03-17 DIAGNOSIS — R001 Bradycardia, unspecified: Secondary | ICD-10-CM | POA: Diagnosis not present

## 2021-03-17 DIAGNOSIS — F1410 Cocaine abuse, uncomplicated: Secondary | ICD-10-CM | POA: Diagnosis present

## 2021-03-17 DIAGNOSIS — I63511 Cerebral infarction due to unspecified occlusion or stenosis of right middle cerebral artery: Secondary | ICD-10-CM

## 2021-03-17 DIAGNOSIS — F17200 Nicotine dependence, unspecified, uncomplicated: Secondary | ICD-10-CM | POA: Diagnosis present

## 2021-03-17 DIAGNOSIS — I69354 Hemiplegia and hemiparesis following cerebral infarction affecting left non-dominant side: Principal | ICD-10-CM

## 2021-03-17 DIAGNOSIS — E663 Overweight: Secondary | ICD-10-CM | POA: Diagnosis present

## 2021-03-17 DIAGNOSIS — E669 Obesity, unspecified: Secondary | ICD-10-CM | POA: Diagnosis present

## 2021-03-17 DIAGNOSIS — I69322 Dysarthria following cerebral infarction: Secondary | ICD-10-CM

## 2021-03-17 DIAGNOSIS — Z7982 Long term (current) use of aspirin: Secondary | ICD-10-CM

## 2021-03-17 DIAGNOSIS — N179 Acute kidney failure, unspecified: Secondary | ICD-10-CM | POA: Diagnosis present

## 2021-03-17 DIAGNOSIS — E876 Hypokalemia: Secondary | ICD-10-CM | POA: Diagnosis present

## 2021-03-17 MED ORDER — BLOOD PRESSURE CONTROL BOOK
Freq: Once | Status: AC
  Filled 2021-03-17: qty 1

## 2021-03-17 MED ORDER — ATORVASTATIN CALCIUM 40 MG PO TABS: 40.0000 mg | ORAL_TABLET | Freq: Every day | ORAL | 0 refills | Status: DC

## 2021-03-17 MED ORDER — ATORVASTATIN CALCIUM 40 MG PO TABS
40.0000 mg | ORAL_TABLET | Freq: Every day | ORAL | Status: DC
  Administered 2021-03-17 – 2021-03-23 (×7): 40 mg via ORAL
  Filled 2021-03-17 (×7): qty 1

## 2021-03-17 MED ORDER — LEVETIRACETAM 500 MG PO TABS
500.0000 mg | ORAL_TABLET | Freq: Two times a day (BID) | ORAL | Status: DC
  Administered 2021-03-17: 500 mg via ORAL
  Filled 2021-03-17: qty 1

## 2021-03-17 MED ORDER — ENOXAPARIN SODIUM 40 MG/0.4ML IJ SOSY: 40.0000 mg | PREFILLED_SYRINGE | INTRAMUSCULAR | Status: DC

## 2021-03-17 MED ORDER — ACETAMINOPHEN 325 MG PO TABS: 650.0000 mg | ORAL_TABLET | Freq: Four times a day (QID) | ORAL | Status: DC | PRN

## 2021-03-17 MED ORDER — ACETAMINOPHEN 650 MG RE SUPP: 650.0000 mg | Freq: Four times a day (QID) | RECTAL | Status: DC | PRN

## 2021-03-17 MED ORDER — PANTOPRAZOLE SODIUM 40 MG PO TBEC
40.0000 mg | DELAYED_RELEASE_TABLET | Freq: Every day | ORAL | Status: DC
  Administered 2021-03-17 – 2021-03-23 (×7): 40 mg via ORAL
  Filled 2021-03-17 (×7): qty 1

## 2021-03-17 MED ORDER — ACETAMINOPHEN 325 MG PO TABS
650.0000 mg | ORAL_TABLET | Freq: Four times a day (QID) | ORAL | Status: DC | PRN
  Filled 2021-03-17: qty 2

## 2021-03-17 MED ORDER — LEVETIRACETAM 500 MG PO TABS: 500.0000 mg | ORAL_TABLET | Freq: Two times a day (BID) | ORAL | 0 refills | Status: DC

## 2021-03-17 MED ORDER — SENNOSIDES-DOCUSATE SODIUM 8.6-50 MG PO TABS
1.0000 | ORAL_TABLET | Freq: Two times a day (BID) | ORAL | Status: DC
  Administered 2021-03-17 – 2021-03-24 (×14): 1 via ORAL
  Filled 2021-03-17 (×14): qty 1

## 2021-03-17 MED ORDER — NICOTINE POLACRILEX 2 MG MT GUM
2.0000 mg | CHEWING_GUM | OROMUCOSAL | Status: DC | PRN
  Filled 2021-03-17: qty 1

## 2021-03-17 MED ORDER — NICOTINE 14 MG/24HR TD PT24
14.0000 mg | MEDICATED_PATCH | Freq: Every day | TRANSDERMAL | Status: DC
  Administered 2021-03-18 – 2021-03-24 (×7): 14 mg via TRANSDERMAL
  Filled 2021-03-17 (×7): qty 1

## 2021-03-17 MED ORDER — ASPIRIN 81 MG PO CHEW: 81.0000 mg | CHEWABLE_TABLET | Freq: Every day | ORAL | Status: AC

## 2021-03-17 MED ORDER — NICOTINE 14 MG/24HR TD PT24: 14.0000 mg | MEDICATED_PATCH | Freq: Every day | TRANSDERMAL | 0 refills | Status: DC

## 2021-03-17 MED ORDER — ENOXAPARIN SODIUM 40 MG/0.4ML IJ SOSY
40.0000 mg | PREFILLED_SYRINGE | INTRAMUSCULAR | Status: DC
  Administered 2021-03-18 – 2021-03-24 (×7): 40 mg via SUBCUTANEOUS
  Filled 2021-03-17 (×7): qty 0.4

## 2021-03-17 MED ORDER — ASPIRIN 81 MG PO CHEW
81.0000 mg | CHEWABLE_TABLET | Freq: Every day | ORAL | Status: DC
  Administered 2021-03-18 – 2021-03-24 (×7): 81 mg via ORAL
  Filled 2021-03-17 (×7): qty 1

## 2021-03-17 MED ORDER — PANTOPRAZOLE SODIUM 40 MG PO TBEC: 40.0000 mg | DELAYED_RELEASE_TABLET | Freq: Every day | ORAL | 0 refills | Status: DC

## 2021-03-17 MED ORDER — NICOTINE POLACRILEX 2 MG MT GUM: 2.0000 mg | CHEWING_GUM | OROMUCOSAL | 0 refills | Status: DC | PRN

## 2021-03-17 NOTE — H&P (Signed)
Physical Medicine and Rehabilitation Admission H&P    Chief Complaint  Patient presents with   Seizures  : HPI: Brandy Nguyen is a 55 year old right-handed female with history of tobacco use as well as hyperlipidemia and has not seen an MD in many years.  Per chart review patient lives with spouse.  Independent prior to admission.  1 level apartment 4 steps to entry.  Presented 03/10/2021 with left-sided weakness x3 days as well as blurred vision.  Family had reported a 45-second episode of shaking which resolved.  Denied loss of consciousness.  Denied any bowel or bladder incontinence.  CT/MRI showed findings consistent with large acute early subacute right posterior MCA territory infarction with exuberant cortical edema, extensive petechial hemorrhage and gyriform enhancement likely related to blood-brain barrier breakdown.  Sulcal/regional mass-effect without midline shift.  CT angiogram of head and neck no large vessel occlusion.  Noncalcified plaque causing less than 50% stenosis at the right ICA origin.  Patient did not receive tPA.  EEG negative for seizure.  Echocardiogram with ejection fraction of 70 to 75% no wall motion abnormalities.  Admission chemistries unremarkable except WBC of 13,200, potassium 3.3, urine drug screen positive cocaine.  Neurology follow-up currently maintained on low-dose aspirin for CVA prophylaxis.  Patient was cleared to begin Lovenox for DVT prophylaxis 03/12/2021.  Recommendations of 30-day cardiac event monitor.  She does remain on Keppra for seizure prophylaxis.  Tolerating a regular diet.  Therapy evaluations completed due to patient's decreased functional ability left-sided weakness recommendations of physical medicine rehab consult. She is very pleasant and highly motivated for therapy!  Review of Systems  Constitutional:  Negative for chills and fever.  HENT:  Negative for hearing loss.   Eyes:  Positive for blurred vision.  Respiratory:  Negative for  cough and shortness of breath.   Cardiovascular:  Negative for chest pain, palpitations and leg swelling.  Gastrointestinal:  Positive for constipation. Negative for heartburn, nausea and vomiting.  Genitourinary:  Negative for dysuria and hematuria.  Musculoskeletal:  Positive for joint pain and myalgias.  Skin:  Negative for rash.  Neurological:  Positive for dizziness and weakness.  All other systems reviewed and are negative. Past Medical History:  Diagnosis Date   Hyperlipidemia 03/11/2021   History reviewed. No pertinent surgical history. History reviewed. No pertinent family history. Social History:  reports that she has been smoking. She has never used smokeless tobacco. She reports current alcohol use. She reports that she does not use drugs. Allergies: No Known Allergies No medications prior to admission.    Drug Regimen Review Drug regimen was reviewed and remains appropriate with no significant issues identified  Home: Home Living Family/patient expects to be discharged to:: Private residence Living Arrangements: Spouse/significant other Available Help at Discharge: Friend(s), Available 24 hours/day Type of Home: Apartment Home Access: Stairs to enter [employer] of Steps: 4 Entrance Stairs-Rails: Left Home Layout: One level Bathroom Shower/Tub: Associate Professor: Yes Home Equipment: None  Lives With: Significant other   Functional History: Prior Function Level of Independence: Independent  Functional Status:  Mobility: Bed Mobility Overal bed mobility: Needs Assistance Bed Mobility: Supine to Sit Supine to sit: Supervision Sit to supine: Supervision General bed mobility comments: Pt up in recliner upon my arrival Transfers Overall transfer level: Needs assistance Equipment used: None Transfers: Sit to/from Stand Sit to Stand: Supervision Stand pivot transfers: Min assist General transfer  comment: S to ambulate around the room to gather and  set up items for bathing dressing Ambulation/Gait Ambulation/Gait assistance: Min assist Gait Distance (Feet): 200 Feet Assistive device: 1 person hand held assist Gait Pattern/deviations: Step-through pattern, Decreased stride length, Decreased weight shift to left, Drifts right/left General Gait Details: Pt ambulating very slow and guarded with occasional unsteadiness requiring min assist to recover. She was able to improve gait speed with cues however appeared uncomfortable, like she was rushing. With the addition of head turns pt's gait speed decreased even more. Gait velocity: decreasd Gait velocity interpretation: <1.31 ft/sec, indicative of household ambulator Stairs: Yes Stairs assistance: Min assist Stair Management: One rail Left, Alternating pattern, Forwards Number of Stairs: 2 General stair comments: minA for balance. Cues to scan L environment to find rail    ADL: ADL Overall ADL's : Needs assistance/impaired Grooming: Wash/dry hands, Wash/dry face, Min guard, Standing Upper Body Dressing : Moderate assistance, Standing Upper Body Dressing Details (indicate cue type and reason): L visual field cut impacts. Lower Body Dressing: Minimal assistance, Sit to/from stand Functional mobility during ADLs: Minimal assistance General ADL Comments: Had pt talk me through everything she would need if she was getting up in the morning, washing up, and getting dressed (she needed Min VCs to think through this). Then I had her get up and gather all the things she had mentioned and we talked about  and set them up where she would need them in her hosptial room (min VCs)  Cognition: Cognition Overall Cognitive Status: Impaired/Different from baseline Arousal/Alertness: Awake/alert Orientation Level: Oriented X4 Attention: Focused, Sustained, Selective Focused Attention: Appears intact Sustained Attention: Appears intact Selective  Attention: Appears intact Memory: Appears intact Awareness: Impaired Awareness Impairment: Emergent impairment, Anticipatory impairment Problem Solving: Impaired Problem Solving Impairment: Functional complex, Functional basic Executive Function: Self Monitoring Self Monitoring: Impaired Self Monitoring Impairment: Functional basic, Functional complex Safety/Judgment: Impaired Cognition Arousal/Alertness: Awake/alert Behavior During Therapy: WFL for tasks assessed/performed Overall Cognitive Status: Impaired/Different from baseline Area of Impairment: Safety/judgement, Problem solving Orientation Level: Time Current Attention Level: Sustained Following Commands: Follows multi-step commands inconsistently, Follows multi-step commands with increased time, Follows one step commands consistently, Follows one step commands with increased time Safety/Judgement: Decreased awareness of safety, Decreased awareness of deficits Awareness: Emergent Problem Solving: Requires verbal cues General Comments: Required frequent cueing for scanning L environment. Decreased awareness of safety and deficits with cues required for preventing running into objects  Physical Exam: Blood pressure 121/88, pulse (!) 58, temperature 97.9 F (36.6 C), temperature source Oral, resp. rate 16, weight 64.5 kg, SpO2 100 %. Physical Exam Gen: no distress, normal appearing HEENT: oral mucosa pink and moist, NCAT. Right gaze preference.  Cardio: Bradycardia Chest: normal effort, normal rate of breathing Abd: soft, non-distended Ext: no edema Psych: pleasant, normal affect Skin: intact Neurological:     Comments: Patient is alert.  No acute distress.  Speech is slightly dysarthric but intelligible.  Right gaze preference.  Provides name and age.  Follows simple commands. 5/5 strength throughout. Decreased sensation on left side. Decreased awareness of deficits.   Results for orders placed or performed during the  hospital encounter of 03/10/21 (from the past 48 hour(s))  Basic metabolic panel     Status: Abnormal   Collection Time: 03/16/21  4:04 AM  Result Value Ref Range   Sodium 137 135 - 145 mmol/L   Potassium 3.9 3.5 - 5.1 mmol/L   Chloride 103 98 - 111 mmol/L   CO2 28 22 - 32 mmol/L   Glucose, Bld 103 (H) 70 -  99 mg/dL    Comment: Glucose reference range applies only to samples taken after fasting for at least 8 hours.   BUN 11 6 - 20 mg/dL   Creatinine, Ser 3.61 (H) 0.44 - 1.00 mg/dL   Calcium 9.5 8.9 - 44.3 mg/dL   GFR, Estimated 57 (L) >60 mL/min    Comment: (NOTE) Calculated using the CKD-EPI Creatinine Equation (2021)    Anion gap 6 5 - 15    Comment: Performed at [facility], [address], [place], [place] [address]   No results found.     Medical Problem List and Plan: 1.  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  Plan 30-day cardiac event monitor  -patient may shower  -ELOS/Goals: 5-7 days S to modI  -Admit to CIR 2.  Impaired mobility -DVT/anticoagulation: Continue Lovenox  -antiplatelet therapy: Aspirin 81 mg daily 3. Pain Management: Tylenol as needed 4. Mood: Provide emotional support  -antipsychotic agents: N/A 5. Neuropsych: This patient is capable of making decisions on her own behalf. 6. Skin/Wound Care: Routine skin checks 7. Fluids/Electrolytes/Nutrition: Routine in and out with follow-up chemistries 8.  Seizure prophylaxis.  Discontinue Keppra since one week seizure free. EEG negative 9.  Tobacco/polysubstance abuse.  Urine drug screen positive cocaine.  Provide counseling 10.  Hyperlipidemia.  Continue Lipitor  I have personally performed a face to face diagnostic evaluation, including, but not limited to relevant history and physical exam findings, of this patient and developed relevant assessment and plan.  Additionally, I have reviewed and concur with the physician  assistant's documentation above.  Sula Soda, MD   Mcarthur Rossetti Angiulli, PA-C 03/17/2021

## 2021-03-17 NOTE — Progress Notes (Signed)
PMR Admission Coordinator Pre-Admission Assessment   Patient: Brandy Nguyen is an 55 y.o., female MRN: [id number] DOB: Dec 18, 1965 Height:   Weight: 64.5 kg   Insurance Information HMO:     PPO:      PCP:      IPA:      80/20:      OTHER: PRIMARY: Uninsured      Policy#:       Subscriber: CM Name:       Phone#:      Fax#: Pre-Cert#:       Employer: Benefits:  Phone #:      Name: Eff. Date:      Deduct:       Out of Pocket Max:       Life Max: CIR:       SNF: Outpatient:      Co-Pay: Home Health:       Co-Pay: DME:      Co-Pay: Providers:  SECONDARY:       Policy#:      Phone#:   Development worker, community:       Phone#:   The Engineer, petroleum" for patients in Inpatient Rehabilitation Facilities with attached "Privacy Act [place] Records" was provided and verbally reviewed with: N/A   Emergency Contact Information Contact Information  Name Relation Home Work [place]   [phone]        [place] Significant other     [phone]    Dario Guardian   [phone]               Current Medical History  Patient Admitting Diagnosis: CVA    History of Present Illness: Brandy Nguyen is a 55 year old right-handed female with history of tobacco use as well as hyperlipidemia and has not seen an MD in many years. Presented 03/10/2021 with left-sided weakness x3 days as well as blurred vision.  Family had reported a 45-second episode of shaking which  resolved.  Denied loss of consciousness.  Denied any bowel or bladder incontinence.  CT/MRI showed findings consistent with large acute, early subacute, right posterior MCA territory infarction with exuberant cortical edema, extensive petechial hemorrhage and gyriform enhancement likely related to blood-brain barrier breakdown.  Sulcal/regional mass-effect without midline shift.  CT angiogram of head and neck no large vessel occlusion.  Noncalcified plaque causing less than 50% stenosis at the right ICA origin.  Patient did not receive tPA.  EEG negative for seizure.  Echocardiogram with ejection fraction of 70 to 75% no wall motion abnormalities.  Admission chemistries unremarkable except WBC of 13,200, potassium 3.3, urine drug screen positive cocaine.  Neurology follow-up currently maintained on low-dose aspirin for CVA prophylaxis.  Patient was cleared to begin Lovenox for DVT prophylaxis 03/12/2021.  Recommendations of 30-day cardiac event monitor.  She does remain on Keppra for seizure prophylaxis.  Tolerating a regular diet.  Therapy evaluations completed due to patient decreased functional ability, visual deficits, and left-sided weakness recommendations of physical medicine rehab consult.    Complete NIHSS TOTAL: 4   [facility]'s medical record from [facility] has been reviewed by the rehabilitation admission coordinator and physician.   Past Medical History      Past Medical History:  Diagnosis Date   Hyperlipidemia 03/11/2021      Family History   family history is not on file.   Prior Rehab/Hospitalizations Has the patient had prior rehab or hospitalizations prior to admission? No   Has the patient had major surgery during 100 days prior to admission? No               Current Medications   Current Facility-Administered Medications:    stroke: mapping our early stages of recovery book, , Does not apply, Once, Kirby-Graham, Karsten Fells, NP   acetaminophen (TYLENOL) tablet 650 mg, 650 mg,  Oral, Q6H PRN, 650 mg at 03/16/21 0635 **OR** acetaminophen (TYLENOL) suppository 650 mg, 650 mg, Rectal, Q6H PRN, Brimage, Vondra, DO   aspirin chewable tablet 81 mg, 81 mg, Oral, Daily, Heard, Courtney S, NP, 81 mg at 03/17/21 0823   atorvastatin (LIPITOR) tablet 40 mg, 40 mg, Oral, Daily, Heard, Courtney S, NP, 40 mg at 03/16/21 2157   enoxaparin (LOVENOX) injection 40 mg, 40 mg, Subcutaneous, Q24H, Carollee Leitz, MD, 40 mg at 03/17/21 9432   levETIRAcetam (KEPPRA) tablet 500 mg, 500 mg, Oral, BID, Alcus Dad, MD, 500 mg at 03/17/21 7614   nicotine (NICODERM CQ - dosed in mg/24 hours) patch 14 mg, 14 mg, Transdermal, Daily, Alcus Dad, MD, 14 mg at 03/17/21 7092   nicotine polacrilex (NICORETTE) gum 2 mg, 2 mg, Oral, Q2H PRN, Donnamae Jude, RPH, 2 mg at 03/13/21 1333   pantoprazole (PROTONIX) EC tablet 40 mg, 40 mg, Oral, QHS, Alcus Dad, MD, 40 mg at 03/16/21 2157  senna-docusate (Senokot-S) tablet 1 tablet, 1 tablet, Oral, BID, Kirby-Graham, Karsten Fells, NP, 1 tablet at 03/17/21 3710   Patients Current Diet:  Diet Order                  Diet regular Room service appropriate? Yes; Fluid consistency: Thin  Diet effective now                         Precautions / Restrictions Precautions Precautions: Fall Precaution Comments: L hemianopsia Restrictions Weight Bearing Restrictions: No    Has the patient had 2 or more falls or a fall with injury in the past year? No   Prior Activity Level Community (5-7x/wk): was working, no DME used, not driving (public transit)   Prior Functional Level Self Care: Did the patient need help bathing, dressing, using the toilet or eating? Independent   Indoor Mobility: Did the patient need assistance with walking from room to room (with or without device)? Independent   Stairs: Did the patient need assistance with internal or external stairs (with or without device)? Independent   Functional Cognition: Did the patient need help  planning regular tasks such as shopping or remembering to take medications? Independent   Home Assistive Devices / Equipment Home Assistive Devices/Equipment: Grab bars in shower, Eyeglasses Home Equipment: None   Prior Device Use: Indicate devices/aids used by the patient prior to current illness, exacerbation or injury? None of the above   Current Functional Level Cognition   Arousal/Alertness: Awake/alert Overall Cognitive Status: Impaired/Different from baseline Current Attention Level: Sustained Orientation Level: Oriented X4 Following Commands: Follows multi-step commands inconsistently, Follows multi-step commands with increased time, Follows one step commands consistently, Follows one step commands with increased time Safety/Judgement: Decreased awareness of safety, Decreased awareness of deficits General Comments: Required frequent cueing for scanning L environment. Decreased awareness of safety and deficits with cues required for preventing running into objects Attention: Focused, Sustained, Selective Focused Attention: Appears intact Sustained Attention: Appears intact Selective Attention: Appears intact Memory: Appears intact Awareness: Impaired Awareness Impairment: Emergent impairment, Anticipatory impairment Problem Solving: Impaired Problem Solving Impairment: Functional complex, Functional basic Executive Function: Self Monitoring Self Monitoring: Impaired Self Monitoring Impairment: Functional basic, Functional complex Safety/Judgment: Impaired    Extremity Assessment (includes Sensation/Coordination)   Upper Extremity Assessment: Defer to OT evaluation LUE Deficits / Details: 3+/5 MMT grossly to LUE LUE Sensation: decreased light touch LUE Coordination: decreased fine motor, decreased gross motor  Lower Extremity Assessment: Generalized weakness, LLE deficits/detail (R LE grossly 4-/5) LLE Deficits / Details: grossly 3+/5 LLE Sensation: decreased light  touch LLE Coordination: decreased fine motor, decreased gross motor     ADLs   Overall ADL's : Needs assistance/impaired Grooming: Wash/dry hands, Wash/dry face, Min guard, Standing Upper Body Dressing : Moderate assistance, Standing Upper Body Dressing Details (indicate cue type and reason): L visual field cut impacts. Lower Body Dressing: Minimal assistance, Sit to/from stand Functional mobility during ADLs: Minimal assistance General ADL Comments: Had pt talk me through everything she would need if she was getting up in the morning, washing up, and getting dressed (she needed Min VCs to think through this). Then I had her get up and gather all the things she had mentioned and we talked about  and set them up where she would need them in her hosptial room (min VCs)     Mobility   Overal bed mobility: Needs Assistance Bed Mobility: Supine to Sit Supine to sit: Supervision Sit to  supine: Supervision General bed mobility comments: Pt up in recliner upon my arrival     Transfers   Overall transfer level: Needs assistance Equipment used: None Transfers: Sit to/from Stand Sit to Stand: Supervision Stand pivot transfers: Min assist General transfer comment: S to ambulate around the room to gather and set up items for bathing dressing     Ambulation / Gait / Stairs / Wheelchair Mobility   Ambulation/Gait Ambulation/Gait assistance: Min Web designer (Feet): 200 Feet Assistive device: 1 person hand held assist Gait Pattern/deviations: Step-through pattern, Decreased stride length, Decreased weight shift to left, Drifts right/left General Gait Details: Pt ambulating very slow and guarded with occasional unsteadiness requiring min assist to recover. She was able to improve gait speed with cues however appeared uncomfortable, like she was rushing. With the addition of head turns pt's gait speed decreased even more. Gait velocity: decreasd Gait velocity interpretation: <1.31 ft/sec,  indicative of household ambulator Stairs: Yes Stairs assistance: Min assist Stair Management: One rail Left, Alternating pattern, Forwards Number of Stairs: 2 General stair comments: minA for balance. Cues to scan L environment to find rail     Posture / Balance Balance Overall balance assessment: Mild deficits observed, not formally tested Sitting-balance support: Feet supported Sitting balance-Leahy Scale: Fair Standing balance support: No upper extremity supported, During functional activity Standing balance-Leahy Scale: Poor Standing balance comment: requires minA for balance during ambulation and dynamic reaching tasks at sink Standardized Balance Assessment Standardized Balance Assessment : Dynamic Gait Index Dynamic Gait Index Level Surface: Moderate Impairment Change in Gait Speed: Moderate Impairment Gait with Horizontal Head Turns: Moderate Impairment Gait with Vertical Head Turns: Moderate Impairment Gait and Pivot Turn: Mild Impairment Step Over Obstacle: Moderate Impairment Step Around Obstacles: Moderate Impairment Steps: Mild Impairment Total Score: 10     Special needs/care consideration Designated visitor Romana Juniper is NOT to visit per patient.     Previous Home Environment (from acute therapy documentation) Living Arrangements: Spouse/significant other  Lives With: Significant other Available Help at Discharge: Friend(s), Available 24 hours/day Type of Home: [place]: One level Home Access: Stairs to enter Entrance Stairs-Rails: Left Entrance Stairs-Number of Steps: 4 Bathroom Shower/Tub: Chiropodist: Standard Bathroom Accessibility: Yes How Accessible: Accessible via walker [place]: No   Discharge Living Setting Plans for Discharge Living Setting: Patient's home, Other (Comment) (pt's significant other, Remo Lipps, is NOT to be on the premises, per family/pt) Type of Home at Discharge: Apartment Discharge  Home Layout: One level Discharge Home Access: Stairs to enter Entrance Stairs-Rails: Left Entrance Stairs-Number of Steps: 4 Discharge Bathroom Shower/Tub: Tub/shower unit Discharge Bathroom Toilet: Standard Discharge Bathroom Accessibility: Yes How Accessible: Accessible via walker Does the patient have any problems obtaining your medications?: Yes (Describe) (uninsured)   Social/Family/Support Systems Anticipated Caregiver: sister Arbutus Ped), brother Hilliard Clark), and neice Sandre Kitty) Anticipated Caregiver's Contact Information: Jana Half: [phone], Lynann Bologna [phone] Ability/Limitations of Caregiver: supervision only Caregiver Availability: Other (Comment) (will provide as close to 24/7 as they can, but it may not be full 24) Discharge Plan Discussed with Primary Caregiver: Yes Is Caregiver In Agreement with Plan?: Yes Does Caregiver/Family have Issues with Lodging/Transportation while Pt is in Rehab?: No   Goals Patient/Family Goal for Rehab: PT/OT supervision to mod I in familiar environment, CGA in unfamiliar environments Expected length of stay: 5-8 days Additional Information: pt with new L visual field cut Pt/Family Agrees to Admission and willing to participate: Yes Program Orientation Provided & Reviewed with Pt/Caregiver  Including Roles  & Responsibilities: Yes  Barriers to Discharge: Insurance for SNF coverage, Lack of/limited family support   Decrease burden of Care through IP rehab admission: n/a   Possible need for SNF placement upon discharge: Not anticipated. Reviewed with patient and family that, due to no payor source/potential medicaid, the only discharge option from CIR was to go home.    Patient Condition: I have reviewed medical records from [facility], spoken with CM, and patient and family member. I met with patient at the bedside and discussed via phone for inpatient rehabilitation assessment.  Patient will benefit from ongoing PT, OT, and SLP,  can actively participate in 3 hours of therapy a day 5 days of the week, and can make measurable gains during the admission.  Patient will also benefit from the coordinated team approach during an Inpatient Acute Rehabilitation admission.  The patient will receive intensive therapy as well as Rehabilitation physician, nursing, social worker, and care management interventions.  Due to safety, disease management, medication administration, pain management, and patient education the patient requires 24 hour a day rehabilitation nursing.  The patient is currently min assist with mobility and basic ADLs.  Discharge setting and therapy post discharge at home with outpatient is anticipated.  Patient has agreed to participate in the Acute Inpatient Rehabilitation Program and will admit today.   Preadmission Screen Completed By:  Michel Santee, PT, DPT 03/17/2021 10:47 AM ______________________________________________________________________   Discussed status with Dr. Ranell Patrick on 03/17/21  at 10:50 AM  and received approval for admission today.   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 Yes Co-Morbidities requiring supervision/potential complications: history of tobacco use, HLD, blurry vision, left sided weakness, right ICA stenosis Due to bladder management, bowel management, safety, skin/wound care, disease management, medication administration, pain management, and patient education, does the patient require 24 hr/day rehab nursing? Yes Does the patient require coordinated care of a physician, rehab nurse, PT, OT, and SLP to address physical and functional deficits in the context of the above medical diagnosis(es)?  Yes Addressing deficits in the following areas: balance, endurance, locomotion, strength, transferring, bowel/bladder control, bathing, dressing, feeding, grooming, toileting, cognition, and psychosocial support Can the patient actively participate in an intensive therapy program of at least 3 hrs of therapy 5 days a week? Yes The potential for patient to make measurable gains while on inpatient rehab is excellent Anticipated functional outcomes upon discharge from inpatient rehab: supervision PT, supervision OT, supervision SLP Estimated rehab length of stay to reach the above functional goals is: 5-7 days Anticipated discharge destination: Home 10. Overall Rehab/Functional Prognosis: excellent     MD Signature: Leeroy Cha, MD

## 2021-03-17 NOTE — PMR Pre-admission (Signed)
PMR Admission Coordinator Pre-Admission Assessment  Patient: Brandy Nguyen is an 55 y.o., female MRN: [id number] DOB: Mar 01, 1966 Height:   Weight: 64.5 kg  Insurance Information HMO:     PPO:      PCP:      IPA:      80/20:      OTHER:  PRIMARY: Uninsured      Policy#:       Subscriber:  CM Name:       Phone#:      Fax#:  Pre-Cert#:       Employer:  Benefits:  Phone #:      Name:  Eff. Date:      Deduct:       Out of Pocket Max:       Life Max:  CIR:       SNF:  Outpatient:      Co-Pay:  Home Health:       Co-Pay: DME:      Co-Pay:  Providers:  SECONDARY:       Policy#:      Phone#:   Development worker, community:       Phone#:   The Engineer, petroleum" for patients in Inpatient Rehabilitation Facilities with attached "Privacy Act [place] Records" was provided and verbally reviewed with: N/A  Emergency Contact Information Contact Information     Name Relation Home Work [place]  [phone]     [place] Significant other   [phone]   Dario Guardian  [phone]         Current Medical History  Patient Admitting Diagnosis: CVA   History of Present Illness: Brandy Nguyen is a 55 year old right-handed female with history of tobacco use as well as hyperlipidemia and has not seen an MD in many years. Presented 03/10/2021 with left-sided weakness x3 days as well as blurred vision.  Family had reported a 45-second episode of shaking which resolved.  Denied loss of consciousness.  Denied any bowel or bladder incontinence.  CT/MRI showed findings consistent with large acute, early subacute, right posterior MCA territory infarction with exuberant cortical edema, extensive petechial hemorrhage and gyriform enhancement likely related to blood-brain barrier breakdown.  Sulcal/regional mass-effect without midline shift.  CT angiogram of head and neck no large vessel occlusion.  Noncalcified plaque causing less than 50% stenosis at the right ICA  origin.  Patient did not receive tPA.  EEG negative for seizure.  Echocardiogram with ejection fraction of 70 to 75% no wall motion abnormalities.  Admission chemistries unremarkable except WBC of 13,200, potassium 3.3, urine drug screen positive cocaine.  Neurology follow-up currently maintained on low-dose aspirin for CVA prophylaxis.  Patient was cleared to begin Lovenox for DVT prophylaxis 03/12/2021.  Recommendations of 30-day cardiac event monitor.  She does remain on Keppra for seizure prophylaxis.  Tolerating a regular diet.  Therapy evaluations completed due to patient decreased functional ability, visual deficits, and left-sided weakness recommendations of physical medicine rehab consult.   Complete NIHSS TOTAL: 4  [facility]'s medical record [facility] has been reviewed by the rehabilitation admission coordinator and physician.  Past Medical History  Past Medical History:  Diagnosis Date   Hyperlipidemia 03/11/2021    Family History   family history is not on file.  Prior Rehab/Hospitalizations Has the patient had prior rehab or hospitalizations prior to admission? No  Has the patient had major surgery during 100 days prior to admission? No   Current Medications  Current Facility-Administered Medications:  stroke: mapping our early stages of recovery book, , Does not apply, Once, Kirby-Graham, Karsten Fells, NP   acetaminophen (TYLENOL) tablet 650 mg, 650 mg, Oral, Q6H PRN, 650 mg at 03/16/21 0635 **OR** acetaminophen (TYLENOL) suppository 650 mg, 650 mg, Rectal, Q6H PRN, Brimage, Vondra, DO   aspirin chewable tablet 81 mg, 81 mg, Oral, Daily, Heard, Courtney S, NP, 81 mg at 03/17/21 0823   atorvastatin (LIPITOR) tablet 40 mg, 40 mg, Oral, Daily, Heard, Courtney S, NP, 40 mg at 03/16/21 2157   enoxaparin (LOVENOX) injection 40 mg, 40 mg, Subcutaneous, Q24H, Carollee Leitz, MD, 40 mg at 03/17/21 8546   levETIRAcetam (KEPPRA) tablet 500 mg, 500 mg, Oral, BID, Alcus Dad, MD, 500 mg  at 03/17/21 2703   nicotine (NICODERM CQ - dosed in mg/24 hours) patch 14 mg, 14 mg, Transdermal, Daily, Alcus Dad, MD, 14 mg at 03/17/21 5009   nicotine polacrilex (NICORETTE) gum 2 mg, 2 mg, Oral, Q2H PRN, Donnamae Jude, RPH, 2 mg at 03/13/21 1333   pantoprazole (PROTONIX) EC tablet 40 mg, 40 mg, Oral, QHS, Wells, Ashleigh, MD, 40 mg at 03/16/21 2157   senna-docusate (Senokot-S) tablet 1 tablet, 1 tablet, Oral, BID, Kirby-Graham, Karsten Fells, NP, 1 tablet at 03/17/21 3818  Patients Current Diet:  Diet Order             Diet regular Room service appropriate? Yes; Fluid consistency: Thin  Diet effective now                   Precautions / Restrictions Precautions Precautions: Fall Precaution Comments: L hemianopsia Restrictions Weight Bearing Restrictions: No   Has the patient had 2 or more falls or a fall with injury in the past year? No  Prior Activity Level Community (5-7x/wk): was working, no DME used, not driving (public transit)  Prior Functional Level Self Care: Did the patient need help bathing, dressing, using the toilet or eating? Independent  Indoor Mobility: Did the patient need assistance with walking from room to room (with or without device)? Independent  Stairs: Did the patient need assistance with internal or external stairs (with or without device)? Independent  Functional Cognition: Did the patient need help planning regular tasks such as shopping or remembering to take medications? Independent  Home Assistive Devices / Equipment Home Assistive Devices/Equipment: Grab bars in shower, Eyeglasses Home Equipment: None  Prior Device Use: Indicate devices/aids used by the patient prior to current illness, exacerbation or injury? None of the above  Current Functional Level Cognition  Arousal/Alertness: Awake/alert Overall Cognitive Status: Impaired/Different from baseline Current Attention Level: Sustained Orientation Level: Oriented X4 Following  Commands: Follows multi-step commands inconsistently, Follows multi-step commands with increased time, Follows one step commands consistently, Follows one step commands with increased time Safety/Judgement: Decreased awareness of safety, Decreased awareness of deficits General Comments: Required frequent cueing for scanning L environment. Decreased awareness of safety and deficits with cues required for preventing running into objects Attention: Focused, Sustained, Selective Focused Attention: Appears intact Sustained Attention: Appears intact Selective Attention: Appears intact Memory: Appears intact Awareness: Impaired Awareness Impairment: Emergent impairment, Anticipatory impairment Problem Solving: Impaired Problem Solving Impairment: Functional complex, Functional basic Executive Function: Self Monitoring Self Monitoring: Impaired Self Monitoring Impairment: Functional basic, Functional complex Safety/Judgment: Impaired    Extremity Assessment (includes Sensation/Coordination)  Upper Extremity Assessment: Defer to OT evaluation LUE Deficits / Details: 3+/5 MMT grossly to LUE LUE Sensation: decreased light touch LUE Coordination: decreased fine motor, decreased gross motor  Lower Extremity Assessment:  Generalized weakness, LLE deficits/detail (R LE grossly 4-/5) LLE Deficits / Details: grossly 3+/5 LLE Sensation: decreased light touch LLE Coordination: decreased fine motor, decreased gross motor    ADLs  Overall ADL's : Needs assistance/impaired Grooming: Wash/dry hands, Wash/dry face, Min guard, Standing Upper Body Dressing : Moderate assistance, Standing Upper Body Dressing Details (indicate cue type and reason): L visual field cut impacts. Lower Body Dressing: Minimal assistance, Sit to/from stand Functional mobility during ADLs: Minimal assistance General ADL Comments: Had pt talk me through everything she would need if she was getting up in the morning, washing up, and  getting dressed (she needed Min VCs to think through this). Then I had her get up and gather all the things she had mentioned and we talked about  and set them up where she would need them in her hosptial room (min VCs)    Mobility  Overal bed mobility: Needs Assistance Bed Mobility: Supine to Sit Supine to sit: Supervision Sit to supine: Supervision General bed mobility comments: Pt up in recliner upon my arrival    Transfers  Overall transfer level: Needs assistance Equipment used: None Transfers: Sit to/from Stand Sit to Stand: Supervision Stand pivot transfers: Min assist General transfer comment: S to ambulate around the room to gather and set up items for bathing dressing    Ambulation / Gait / Stairs / Wheelchair Mobility  Ambulation/Gait Ambulation/Gait assistance: Herbalist (Feet): 200 Feet Assistive device: 1 person hand held assist Gait Pattern/deviations: Step-through pattern, Decreased stride length, Decreased weight shift to left, Drifts right/left General Gait Details: Pt ambulating very slow and guarded with occasional unsteadiness requiring min assist to recover. She was able to improve gait speed with cues however appeared uncomfortable, like she was rushing. With the addition of head turns pt's gait speed decreased even more. Gait velocity: decreasd Gait velocity interpretation: <1.31 ft/sec, indicative of household ambulator Stairs: Yes Stairs assistance: Min assist Stair Management: One rail Left, Alternating pattern, Forwards Number of Stairs: 2 General stair comments: minA for balance. Cues to scan L environment to find rail    Posture / Balance Balance Overall balance assessment: Mild deficits observed, not formally tested Sitting-balance support: Feet supported Sitting balance-Leahy Scale: Fair Standing balance support: No upper extremity supported, During functional activity Standing balance-Leahy Scale: Poor Standing balance comment:  requires minA for balance during ambulation and dynamic reaching tasks at sink Standardized Balance Assessment Standardized Balance Assessment : Dynamic Gait Index Dynamic Gait Index Level Surface: Moderate Impairment Change in Gait Speed: Moderate Impairment Gait with Horizontal Head Turns: Moderate Impairment Gait with Vertical Head Turns: Moderate Impairment Gait and Pivot Turn: Mild Impairment Step Over Obstacle: Moderate Impairment Step Around Obstacles: Moderate Impairment Steps: Mild Impairment Total Score: 10    Special needs/care consideration Designated visitor Romana Juniper is NOT to visit per patient.    Previous Home Environment (from acute therapy documentation) Living Arrangements: Spouse/significant other  Lives With: Significant other Available Help at Discharge: Friend(s), Available 24 hours/day Type of Home: Apartment Home Layout: One level Home Access: Stairs to enter Entrance Stairs-Rails: Left Entrance Stairs-Number of Steps: 4 Bathroom Shower/Tub: Chiropodist: Standard Bathroom Accessibility: Yes How Accessible: Accessible via walker [place]: No  Discharge Living Setting Plans for Discharge Living Setting: Patient's home, Other (Comment) (pt's significant other, Remo Lipps, is NOT to be on the premises, per family/pt) Type of Home at Discharge: Apartment Discharge Home Layout: One level Discharge Home Access: Stairs to enter Entrance Stairs-Rails: Left  Entrance Stairs-Number of Steps: 4 Discharge Bathroom Shower/Tub: Tub/shower unit Discharge Bathroom Toilet: Standard Discharge Bathroom Accessibility: Yes How Accessible: Accessible via walker Does the patient have any problems obtaining your medications?: Yes (Describe) (uninsured)  Social/Family/Support Systems Anticipated Caregiver: sister Arbutus Ped), brother Hilliard Clark), and neice Sandre Kitty) Anticipated Caregiver's Contact Information: Jana Half:  [phone], Lynann Bologna [phone] Ability/Limitations of Caregiver: supervision only Caregiver Availability: Other (Comment) (will provide as close to 24/7 as they can, but it may not be full 24) Discharge Plan Discussed with Primary Caregiver: Yes Is Caregiver In Agreement with Plan?: Yes Does Caregiver/Family have Issues with Lodging/Transportation while Pt is in Rehab?: No  Goals Patient/Family Goal for Rehab: PT/OT supervision to mod I in familiar environment, CGA in unfamiliar environments Expected length of stay: 5-8 days Additional Information: pt with new L visual field cut Pt/Family Agrees to Admission and willing to participate: Yes Program Orientation Provided & Reviewed with Pt/Caregiver Including Roles  & Responsibilities: Yes  Barriers to Discharge: Insurance for SNF coverage, Lack of/limited family support  Decrease burden of Care through IP rehab admission: n/a  Possible need for SNF placement upon discharge: Not anticipated. Reviewed with patient and family that, due to no payor source/potential medicaid, the only discharge option from CIR was to go home.   Patient Condition: I have reviewed medical records from [facility], spoken with CM, and patient and family member. I met with patient at the bedside and discussed via phone for inpatient rehabilitation assessment.  Patient will benefit from ongoing PT, OT, and SLP, can actively participate in 3 hours of therapy a day 5 days of the week, and can make measurable gains during the admission.  Patient will also benefit from the coordinated team approach during an Inpatient Acute Rehabilitation admission.  The patient will receive intensive therapy as well as Rehabilitation physician, nursing, social worker, and care management interventions.  Due to safety, disease management, medication administration, pain management, and patient education the patient requires 24 hour a day rehabilitation nursing.  The patient is currently min  assist with mobility and basic ADLs.  Discharge setting and therapy post discharge at home with outpatient is anticipated.  Patient has agreed to participate in the Acute Inpatient Rehabilitation Program and will admit today.  Preadmission Screen Completed By:  Michel Santee, PT, DPT 03/17/2021 10:47 AM ______________________________________________________________________   Discussed status with Dr. Ranell Patrick on 03/17/21  at 10:50 AM  and received approval for admission today.  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 Yes Co-Morbidities requiring supervision/potential complications: history of tobacco use, HLD, blurry vision, left sided weakness, right ICA stenosis Due to bladder management, bowel management, safety, skin/wound care, disease management, medication administration, pain management, and patient education, does the patient require 24 hr/day rehab nursing? Yes Does the patient require coordinated care of a physician, rehab nurse, PT, OT, and SLP to address physical and functional deficits in the context of the above medical diagnosis(es)? Yes Addressing deficits in the following areas: balance, endurance, locomotion, strength, transferring, bowel/bladder control, bathing, dressing, feeding, grooming, toileting, cognition, and psychosocial support Can the patient actively participate in an intensive therapy program of at least 3 hrs of therapy 5 days a week? Yes The potential for patient  to make measurable gains while on inpatient rehab is excellent Anticipated functional outcomes upon discharge from inpatient rehab: supervision PT, supervision OT, supervision SLP Estimated rehab length of stay to  reach the above functional goals is: 5-7 days Anticipated discharge destination: Home 10. Overall Rehab/Functional Prognosis: excellent   MD Signature: Leeroy Cha, MD

## 2021-03-17 NOTE — H&P (Signed)
Physical Medicine and Rehabilitation Admission H&P  CC: R MCA stroke  HPI: Brandy Nguyen is a 55 year old right-handed female with history of tobacco use as well as hyperlipidemia and has not seen an MD in many years.  Per chart review patient lives with spouse.  Independent prior to admission.  1 level apartment 4 steps to entry.  Presented 03/10/2021 with left-sided weakness x3 days as well as blurred vision.  Family had reported a 45-second episode of shaking which resolved.  Denied loss of consciousness.  Denied any bowel or bladder incontinence.  CT/MRI showed findings consistent with large acute early subacute right posterior MCA territory infarction with exuberant cortical edema, extensive petechial hemorrhage and gyriform enhancement likely related to blood-brain barrier breakdown.  Sulcal/regional mass-effect without midline shift.  CT angiogram of head and neck no large vessel occlusion.  Noncalcified plaque causing less than 50% stenosis at the right ICA origin.  Patient did not receive tPA.  EEG negative for seizure.  Echocardiogram with ejection fraction of 70 to 75% no wall motion abnormalities.  Admission chemistries unremarkable except WBC of 13,200, potassium 3.3, urine drug screen positive cocaine.  Neurology follow-up currently maintained on low-dose aspirin for CVA prophylaxis.  Patient was cleared to begin Lovenox for DVT prophylaxis 03/12/2021.  Recommendations of 30-day cardiac event monitor.  She does remain on Keppra for seizure prophylaxis.  Tolerating a regular diet.  Therapy evaluations completed due to patient's decreased functional ability left-sided weakness recommendations of physical medicine rehab consult. She is very pleasant and highly motivated for therapy!  Review of Systems  Constitutional:  Negative for chills and fever.  HENT:  Negative for hearing loss.   Eyes:  Positive for blurred vision.  Respiratory:  Negative for cough and shortness of breath.    Cardiovascular:  Negative for chest pain, palpitations and leg swelling.  Gastrointestinal:  Positive for constipation. Negative for heartburn, nausea and vomiting.  Genitourinary:  Negative for dysuria and hematuria.  Musculoskeletal:  Positive for joint pain and myalgias.  Skin:  Negative for rash.  Neurological:  Positive for dizziness and weakness.  All other systems reviewed and are negative. Past Medical History:  Diagnosis Date   Hyperlipidemia 03/11/2021   History reviewed. No pertinent surgical history. History reviewed. No pertinent family history. Social History:  reports that she has been smoking. She has never used smokeless tobacco. She reports current alcohol use. She reports that she does not use drugs. Allergies: No Known Allergies Medications Prior to Admission  Medication Sig Dispense Refill   acetaminophen (TYLENOL) 325 MG tablet Take 2 tablets (650 mg total) by mouth every 6 (six) hours as needed for mild pain (or Fever >/= 101).     aspirin 81 MG chewable tablet Chew 1 tablet (81 mg total) by mouth daily.     atorvastatin (LIPITOR) 40 MG tablet Take 1 tablet (40 mg total) by mouth daily. 90 tablet 0   levETIRAcetam (KEPPRA) 500 MG tablet Take 1 tablet (500 mg total) by mouth 2 (two) times daily. 120 tablet 0   [START ON 03/18/2021] nicotine (NICODERM CQ - DOSED IN MG/24 HOURS) 14 mg/24hr patch Place 1 patch (14 mg total) onto the skin daily. 28 patch 0   nicotine polacrilex (NICORETTE) 2 MG gum Take 1 each (2 mg total) by mouth every 2 (two) hours as needed for smoking cessation. 100 tablet 0   pantoprazole (PROTONIX) 40 MG tablet Take 1 tablet (40 mg total) by mouth at bedtime. 90 tablet 0  Drug Regimen Review Drug regimen was reviewed and remains appropriate with no significant issues identified  Home: Home Living Family/patient expects to be discharged to:: Private residence Living Arrangements: Alone   Functional History:    Functional Status:   Mobility:          ADL:    Cognition: Cognition Orientation Level: Oriented X4    Physical Exam: Blood pressure 106/68, pulse 61, temperature 98.5 F (36.9 C), resp. rate 18, height 4\' 11"  (1.499 m), weight 66.6 kg, SpO2 100 %. Physical Exam Gen: no distress, normal appearing HEENT: oral mucosa pink and moist, NCAT. Right gaze preference.  Cardio: Bradycardia Chest: normal effort, normal rate of breathing Abd: soft, non-distended Ext: no edema Psych: pleasant, normal affect Skin: intact Neurological:     Comments: Patient is alert.  No acute distress.  Speech is slightly dysarthric but intelligible.  Right gaze preference.  Provides name and age.  Follows simple commands. 5/5 strength throughout. Decreased sensation on left side. Decreased awareness of deficits.   Results for orders placed or performed during the hospital encounter of 03/10/21 (from the past 48 hour(s))  Basic metabolic panel     Status: Abnormal   Collection Time: 03/16/21  4:04 AM  Result Value Ref Range   Sodium 137 135 - 145 mmol/L   Potassium 3.9 3.5 - 5.1 mmol/L   Chloride 103 98 - 111 mmol/L   CO2 28 22 - 32 mmol/L   Glucose, Bld 103 (H) 70 - 99 mg/dL    Comment: Glucose reference range applies only to samples taken after fasting for at least 8 hours.   BUN 11 6 - 20 mg/dL   Creatinine, Ser 03/18/21 (H) 0.44 - 1.00 mg/dL   Calcium 9.5 8.9 - 6.01 mg/dL   GFR, Estimated 57 (L) >60 mL/min    Comment: (NOTE) Calculated using the CKD-EPI Creatinine Equation (2021)    Anion gap 6 5 - 15    Comment: Performed at [facility], [address] [place], [place]   No results found.     Medical Problem List and Plan: 1.  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  Plan 30-day cardiac event monitor  -patient may shower  -ELOS/Goals: 5-7 days S to modI  -Admit to CIR 2.  Impaired mobility -DVT/anticoagulation:  Continue Lovenox  -antiplatelet therapy: Aspirin 81 mg daily 3. Pain Management: Tylenol as needed 4. Mood: Provide emotional support  -antipsychotic agents: N/A 5. Neuropsych: This patient is capable of making decisions on her own behalf. 6. Skin/Wound Care: Routine skin checks 7. Fluids/Electrolytes/Nutrition: Routine in and out with follow-up chemistries 8.  Seizure prophylaxis.  Discontinue Keppra since one week seizure free. EEG negative 9.  Tobacco/polysubstance abuse.  Urine drug screen positive cocaine.  Provide counseling 10.  Hyperlipidemia.  Continue Lipitor  I have personally performed a face to face diagnostic evaluation, including, but not limited to relevant history and physical exam findings, of this patient and developed relevant assessment and plan.  Additionally, I have reviewed and concur with the physician assistant's documentation above.  [id number] Angiulli, PA-C 03/17/2021    03/19/2021, MD 03/17/2021

## 2021-03-17 NOTE — Progress Notes (Signed)
FPTS Interim Progress Note  Patient sleeping and resting comfortably.  Rounded with primary RN.  No concerns voiced.  No orders required.  Appreciated nightly round.  Today's Vitals   03/16/21 2034 03/16/21 2100 03/16/21 2311 03/17/21 0415  BP: 108/64  108/77 114/78  Pulse: 64  60 61  Resp: 18  18 18   Temp: 98.4 F (36.9 C)  97.9 F (36.6 C) 97.9 F (36.6 C)  TempSrc: Oral  Oral Oral  SpO2: 99%  99% 100%  Weight:      PainSc:  0-No pain       , MD 03/17/2021, 4:47 AM PGY-2, [facility] Service pager [phone]

## 2021-03-17 NOTE — Progress Notes (Signed)
Inpatient Rehabilitation Medication Review by a Pharmacist  A complete drug regimen review was completed for this patient to identify any potential clinically significant medication issues.  Clinically significant medication issues were identified:  no  Check AMION for pharmacist assigned to patient if future medication questions/issues arise during this admission.  Pharmacist comments:   Time spent performing this drug regimen review (minutes):  5 min  Alphia Moh, PharmD, [facility] Clinical Pharmacist  Please check AMION for all [facility] Pharmacy phone numbers After 10:00 PM, call Main Pharmacy [phone]

## 2021-03-17 NOTE — Discharge Summary (Signed)
Family Medicine Teaching [facility] Discharge Summary  Patient name: Brandy Nguyen Medical record number: [id number] Date of birth: June 16, 1966 Age: 55 y.o. Gender: female Date of Admission: 03/10/2021  Date of Discharge: 03/17/2021 Admitting Physician: Katha Cabal, DO  Primary Care Provider: Patient, No Pcp Per (Inactive) Consultants: Neuro  Indication for Hospitalization: Acute CVA  Discharge Diagnoses/Problem List:  Acute Hemorrhagic CVA Tobacco Use Cocaine Use HLD HTN  Disposition: CIR  Discharge Condition: Stable  Discharge Exam:  General: alert, NAD HEENT: PERRL, EOMI Cardiovascular: RRR, normal S1/S2 without m/r/g Respiratory: normal effort, lungs CTAB Abdomen: soft, nontender Neuro: CN II-XII intact, normal tone, 5/5 strength in all extremities, sensation to light touch intact in all extremities, decreased visual fields on L  Brief Hospital Course:   Brandy Nguyen is a 55 year old female who presented with left sided weakness and seizure-like activity and was found to have acute hemorrhagic CVA. PMH significant for HTN and tobacco use.  Acute Hemorrhagic CVA Patient presented with L sided weakness and seizure-like activity witnessed by family. MRI on admission showed large acute right posterior MCA infarct with extensive petechial hemorrhage and regional mass effect (no midline shift). Serial CT scans were stable. Neuro followed the patient throughout her admission and she was initiated on ASA and statin.  Stroke workup remarkable for cocaine+, LDL 142. Echo showed EF 70-75% with hyperdynamic LV function. A1c, TSH unremarkable. PT recommended CIR at discharge due to some left sided visual deficits limiting her overall function.  Seizure-like activity EEG showed cortical slowing from underlying stroke, no seizures or epileptiform discharges. She was started on Keppra, which was continued at discharge. She may be able to taper off Keppra in the future, per  neuro.  HTN Initially very hypertensive, required 1 dose of IV hydralazine. Subsequently normotensive without meds. Long term BP goal is normotensive.  Tobacco Use Patient motivated to quit. Discharged with nicotine patch and gum. Needs PCP follow up.  Issues for Follow Up:  Needs 30 day cardiac event monitor at discharge per neuro Patient needs PCP  Significant Procedures: None  Significant Labs and Imaging:  Recent Labs  Lab 03/11/21 0500 03/13/21 0418 03/14/21 0107  WBC 12.0* 5.8 6.3  HGB 16.3* 13.7 14.2  HCT 47.3* 41.1 42.1  PLT 266 252 270   Recent Labs  Lab 03/10/21 1223 03/11/21 0500 03/12/21 0400 03/13/21 0418 03/14/21 0107 03/14/21 0522 03/16/21 0404  NA 139 136 137 138 137 139 137  K 3.3* 4.1 3.9 3.3* 4.1 3.8 3.9  CL 114* 104 104 104 101 104 103  CO2 18* 23 24 25 25 26 28   GLUCOSE 97 113* 110* 114* 121* 111* 103*  BUN 8 7 10 9 10 10 11   CREATININE 0.77 1.10* 1.07* 0.98 1.07* 0.94 1.13*  CALCIUM 6.8* 9.2 9.1 9.3 10.0 9.6 9.5  MG 1.1* 2.7*  --  1.8  --   --   --   ALKPHOS 53 69  --   --   --   --   --   AST 30 24  --   --   --   --   --   ALT 14 17  --   --   --   --   --   ALBUMIN 2.4* 3.1*  --   --   --   --   --     MRI Brain W and WO Contrast IMPRESSION: 1. Findings most consistent with a large acute or early subacute right  posterior MCA territory infarct with exuberant cortical edema, extensive petechial hemorrhage, and gyriform enhancement likely relating to blood brain barrier breakdown. Sulcal/regional mass effect without midline shift. Recommend continued attention on follow-up imaging to ensure expected evolution and exclude underlying mass. 2. Possible diminished right MCA flow void, which could relate to slice selection versus underlying stenosis. Recommend CTA to further evaluate.   Results/Tests Pending at Time of Discharge: None  Discharge Medications:  Allergies as of 03/17/2021   No Known Allergies      Medication List      TAKE these medications    acetaminophen 325 MG tablet Commonly known as: TYLENOL Take 2 tablets (650 mg total) by mouth every 6 (six) hours as needed for mild pain (or Fever >/= 101).   aspirin 81 MG chewable tablet Chew 1 tablet (81 mg total) by mouth daily.   atorvastatin 40 MG tablet Commonly known as: LIPITOR Take 1 tablet (40 mg total) by mouth daily.   levETIRAcetam 500 MG tablet Commonly known as: KEPPRA Take 1 tablet (500 mg total) by mouth 2 (two) times daily.   nicotine 14 mg/24hr patch Commonly known as: NICODERM CQ - dosed in mg/24 hours Place 1 patch (14 mg total) onto the skin daily. Start taking on: March 18, 2021   nicotine polacrilex 2 MG gum Commonly known as: NICORETTE Take 1 each (2 mg total) by mouth every 2 (two) hours as needed for smoking cessation.   pantoprazole 40 MG tablet Commonly known as: PROTONIX Take 1 tablet (40 mg total) by mouth at bedtime.        Discharge Instructions: Please refer to Patient Instructions section of EMR for full details.  Patient was counseled important signs and symptoms that should prompt return to medical care, changes in medications, dietary instructions, activity restrictions, and follow up appointments.   Follow-Up Appointments:  Follow-up Information     [facility] Follow up.   Specialty: Family Medicine Why: Your appointment is at 9:30 am. Please arrive early and bring a picture ID and your current medications. Contact information: [address] [place] [address] [phone]        [facility] Follow up.   Why: Please use this location for your pharmacy needs.  Contact information: [address] [place] [address] [phone]        [facility] Follow up.   Contact information: [address]     [address] [place] [address] [phone]                 Maury Dus, MD 03/17/2021, 11:05 AM PGY-1, [facility] Family Medicine

## 2021-03-17 NOTE — Progress Notes (Signed)
Physical Therapy Treatment Patient Details Name: Brandy Nguyen MRN: [id number] DOB: Jun 05, 1966 Today's Date: 03/17/2021    History of Present Illness 55 y/o female presented to ED on 6/7 following reported possible seizure activity, visual changes, L facial droop and numbess, and L arm incoordination. CT head with concern for 2.5 cm R frontoparietal mass with edema. MRI found large acute or early subacute R posterior MCA infarct with hemorrhage and possible diminished R MCA flow void. PMH: HTN, tobacco use.    PT Comments    Pt received in bed, needed cues to remove covers L side of body. Pt dizzy with initial standing, needed min-guard A for safety. Pt ambulated with and without cane with min A working on pathfinding, which was difficult for pt, and tolerating head turns and changes in direction. Continue to recommend CIR as pt very motivated and expected to progress well with intense therapy.    Follow Up Recommendations  CIR     Equipment Recommendations  Other (comment) (TBD)    Recommendations for Other Services Rehab consult     Precautions / Restrictions Precautions Precautions: Fall Precaution Comments: L hemianopsia Restrictions Weight Bearing Restrictions: No    Mobility  Bed Mobility Overal bed mobility: Needs Assistance Bed Mobility: Supine to Sit     Supine to sit: Supervision     General bed mobility comments: pt needed cues to remove covers from L side of body    Transfers Overall transfer level: Needs assistance Equipment used: None Transfers: Sit to/from Stand Sit to Stand: Min guard         General transfer comment: pt dizzy with initial standing, tends to close eyes, cued to focus gaze on stable objectm min-guard for safety  Ambulation/Gait Ambulation/Gait assistance: Min assist Gait Distance (Feet): 50 Feet (4x) Assistive device: Straight cane;None Gait Pattern/deviations: Step-through pattern;Decreased stride length;Decreased weight  shift to left;Drifts right/left Gait velocity: decreasd Gait velocity interpretation: <1.31 ft/sec, indicative of household ambulator General Gait Details: pt needs specific instructional cues to navigate through hallway and make turns. She completely stops with each change of direction and needed min A. Worked on pathfinding but pt had dififculty with this. Use of cane did improve stability   Stairs             Wheelchair Mobility    Modified Rankin (Stroke Patients Only) Modified Rankin (Stroke Patients Only) Pre-Morbid Rankin Score: No symptoms Modified Rankin: Moderately severe disability     Balance Overall balance assessment: Needs assistance Sitting-balance support: Feet supported Sitting balance-Leahy Scale: Fair     Standing balance support: No upper extremity supported;During functional activity Standing balance-Leahy Scale: Poor Standing balance comment: requires minA for dynamic standing activity               High Level Balance Comments: worked on gaze stabilization with reaching for objects            Cognition Arousal/Alertness: Awake/alert Behavior During Therapy: WFL for tasks assessed/performed Overall Cognitive Status: Impaired/Different from baseline Area of Impairment: Safety/judgement;Problem solving                   Current Attention Level: Selective   Following Commands: Follows multi-step commands inconsistently;Follows multi-step commands with increased time;Follows one step commands consistently;Follows one step commands with increased time Safety/Judgement: Decreased awareness of safety;Decreased awareness of deficits Awareness: Emergent Problem Solving: Requires verbal cues General Comments: Required frequent cueing for scanning L environment. Decreased awareness of safety and deficits with cues required for preventing  running into objects      Exercises      General Comments        Pertinent Vitals/Pain Pain  Assessment: No/denies pain    Home Living                      Prior Function            PT Goals (current goals can now be found in the care plan section) Acute Rehab PT Goals Patient Stated Goal: to be able to go back to work and not lose apartment PT Goal Formulation: With patient Time For Goal Achievement: 03/25/21 Potential to Achieve Goals: Good Progress towards PT goals: Progressing toward goals    Frequency    Min 4X/week      PT Plan Current plan remains appropriate    Co-evaluation              AM-PAC PT "6 Clicks" Mobility   Outcome Measure  Help needed turning from your back to your side while in a flat bed without using bedrails?: None Help needed moving from lying on your back to sitting on the side of a flat bed without using bedrails?: A Little Help needed moving to and from a bed to a chair (including a wheelchair)?: A Little Help needed standing up from a chair using your arms (e.g., wheelchair or bedside chair)?: A Little Help needed to walk in hospital room?: A Little Help needed climbing 3-5 steps with a railing? : A Little 6 Click Score: 19    End of Session Equipment Utilized During Treatment: Gait belt Activity Tolerance: Patient tolerated treatment well Patient left: in chair;with call bell/phone within reach;with chair alarm set Nurse Communication: Mobility status PT Visit Diagnosis: Unsteadiness on feet (R26.81);Muscle weakness (generalized) (M62.81);Other symptoms and signs involving the nervous system (R29.898);Difficulty in walking, not elsewhere classified (R26.2)     Time: 7062-3762 PT Time Calculation (min) (ACUTE ONLY): 34 min  Charges:  $Gait Training: 23-37 mins                     Lyanne Co, PT  Acute Rehab Services  Pager [phone] Office [phone]    Lawana Chambers Danzell Birky 03/17/2021, 12:38 PM
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
## 2021-03-17 NOTE — Progress Notes (Signed)
Family Medicine Teaching Service Daily Progress Note Intern Pager: [phone]  Patient name: Brandy Nguyen Medical record number: [id number] Date of birth: 11-Aug-1966 Age: 55 y.o. Gender: female  Primary Care Provider: Patient, No Pcp Per (Inactive) Consultants: Neuro (signed off) Code Status: Full  Pt Overview and Major Events to Date:  6/7: admitted 6/10: medically stable for discharge to CIR  Assessment and Plan:  Brandy Nguyen is a 55 year old female who presented with L sided weakness and seizure like activity and was found to have acute hemorrhagic CVA. PMH significant for HTN and tobacco use.  Acute Hemorrhagic Stroke Found to have R posterior MCA infarct with petechial hemorrhage on admission. Neuro exam stable. Only limitation is mild L sided visual deficits. -Continue ASA 81mg  and Atorvastatin 40mg  daily -Keppra 500mg  BID -Outpatient neuro f/u -PT/OT -Ongoing discharge planning: await CIR placement vs home with HH   FEN/GI: Regular diet PPx: Lovenox   Status is: Inpatient Remains inpatient appropriate because:Unsafe d/c plan  Dispo: The patient is from: Home              Anticipated d/c is to:  CIR vs home              Patient currently is medically stable to d/c.   Difficult to place patient No     Subjective:  No acute events overnight. Patient denies complaints this morning. Expresses frustration about her visual limitations and being stuck in the hospital.  Objective: Temp:  [97.9 F (36.6 C)-98.6 F (37 C)] 97.9 F (36.6 C) (06/14 0415) Pulse Rate:  [60-64] 61 (06/14 0415) Resp:  [17-19] 18 (06/14 0415) BP: (108-121)/(64-82) 114/78 (06/14 0415) SpO2:  [99 %-100 %] 100 % (06/14 0415) Physical Exam: General: alert, NAD HEENT: PERRL, EOMI Cardiovascular: RRR, normal S1/S2 without m/r/g Respiratory: normal effort, lungs CTAB Abdomen: soft, nontender Neuro: CN II-XII intact, normal tone, 5/5 strength in all extremities, sensation to light touch  intact in all extremities, decreased visual fields on L  Laboratory: Recent Labs  Lab 03/11/21 0500 03/13/21 0418 03/14/21 0107  WBC 12.0* 5.8 6.3  HGB 16.3* 13.7 14.2  HCT 47.3* 41.1 42.1  PLT 266 252 270   Recent Labs  Lab 03/10/21 1223 03/11/21 0500 03/12/21 0400 03/14/21 0107 03/14/21 0522 03/16/21 0404  NA 139 136   < > 137 139 137  K 3.3* 4.1   < > 4.1 3.8 3.9  CL 114* 104   < > 101 104 103  CO2 18* 23   < > 25 26 28   BUN 8 7   < > 10 10 11   CREATININE 0.77 1.10*   < > 1.07* 0.94 1.13*  CALCIUM 6.8* 9.2   < > 10.0 9.6 9.5  PROT 4.7* 6.7  --   --   --   --   BILITOT 0.6 0.8  --   --   --   --   ALKPHOS 53 69  --   --   --   --   ALT 14 17  --   --   --   --   AST 30 24  --   --   --   --   GLUCOSE 97 113*   < > 121* 111* 103*   < > = values in this interval not displayed.    Imaging/Diagnostic Tests: No results found.   05/14/21, MD 03/17/2021, 6:39 AM PGY-1, [facility] Family Medicine FPTS Intern pager: [phone], text pages  welcome

## 2021-03-17 NOTE — Progress Notes (Signed)
Patient arrived on unit to room 4W10 at 1628. Patient is alert and oriented x4. Patient vitals are stable. Patient oriented to safety policy. Assessment completed.  Doree Fudge, LPN

## 2021-03-17 NOTE — Progress Notes (Signed)
Patient ID: Brandy Nguyen, female   DOB: [birth year], 55 y.o.   MRN: [id number] Met with the patient to introduce self and the role of the nurse CM. Reviewed secondary stroke risks including HTN, HLD and Smoking. Reviewed dietary modifications and recommendations for increased protein foods and cooking with less salt. Paitent given handouts on information reviewed. Continue to follow along to discharge to address educational needs and collaborate with the SW to facilitate preparation for discharge. Margarito Liner, RN

## 2021-03-17 NOTE — TOC Transition Note (Signed)
Transition of Care [facility]) - CM/SW Discharge Note   Patient Details  Name: Brandy Nguyen MRN: [id number] Date of Birth: [birth year]  Transition of Care [facility]) CM/SW Contact:  Kermit Balo, RN Phone Number: 03/17/2021, 10:34 AM   Clinical Narrative:    Patient is discharging to CIR today. CM emailed [employer] to make sure a disability application was started.  CM has attempted to reach the SW for [facility] to see about services for the patient at d/c. Message left. CM will follow.  Sue Lush: [phone])   Final next level of care: IP Rehab Facility Barriers to Discharge: Inadequate or no insurance, Barriers Unresolved (comment)   Patient Goals and CMS Choice     Choice offered to / list presented to : Patient  Discharge Placement                       Discharge Plan and Services   Discharge Planning Services: CM Consult                                 Social Determinants of Health (SDOH) Interventions     Readmission Risk Interventions No flowsheet data found.

## 2021-03-18 ENCOUNTER — Other Ambulatory Visit: Payer: Self-pay | Admitting: Home Health

## 2021-03-18 DIAGNOSIS — I639 Cerebral infarction, unspecified: Secondary | ICD-10-CM

## 2021-03-18 DIAGNOSIS — I63511 Cerebral infarction due to unspecified occlusion or stenosis of right middle cerebral artery: Secondary | ICD-10-CM

## 2021-03-18 LAB — COMPREHENSIVE METABOLIC PANEL
ALT: 22 U/L (ref 0–44)
AST: 30 U/L (ref 15–41)
Albumin: 2.8 g/dL — ABNORMAL LOW (ref 3.5–5.0)
Alkaline Phosphatase: 61 U/L (ref 38–126)
Anion gap: 9 (ref 5–15)
BUN: 12 mg/dL (ref 6–20)
CO2: 27 mmol/L (ref 22–32)
Calcium: 9.5 mg/dL (ref 8.9–10.3)
Chloride: 105 mmol/L (ref 98–111)
Creatinine, Ser: 1.11 mg/dL — ABNORMAL HIGH (ref 0.44–1.00)
GFR, Estimated: 58 mL/min — ABNORMAL LOW (ref >60)
Glucose, Bld: 124 mg/dL — ABNORMAL HIGH (ref 70–99)
Potassium: 3.8 mmol/L (ref 3.5–5.1)
Sodium: 141 mmol/L (ref 135–145)
Total Bilirubin: 0.5 mg/dL (ref 0.3–1.2)
Total Protein: 6.6 g/dL (ref 6.5–8.1)

## 2021-03-18 LAB — CBC WITH DIFFERENTIAL/PLATELET
Abs Immature Granulocytes: 0.02 10*3/uL (ref 0.00–0.07)
Basophils Absolute: 0 10*3/uL (ref 0.0–0.1)
Basophils Relative: 1 %
Eosinophils Absolute: 0.2 10*3/uL (ref 0.0–0.5)
Eosinophils Relative: 3 %
HCT: 41.2 % (ref 36.0–46.0)
Hemoglobin: 14 g/dL (ref 12.0–15.0)
Immature Granulocytes: 0 %
Lymphocytes Relative: 29 %
Lymphs Abs: 1.4 10*3/uL (ref 0.7–4.0)
MCH: 31.8 pg (ref 26.0–34.0)
MCHC: 34 g/dL (ref 30.0–36.0)
MCV: 93.6 fL (ref 80.0–100.0)
Monocytes Absolute: 0.4 10*3/uL (ref 0.1–1.0)
Monocytes Relative: 9 %
Neutro Abs: 2.6 10*3/uL (ref 1.7–7.7)
Neutrophils Relative %: 58 %
Platelets: 327 10*3/uL (ref 150–400)
RBC: 4.4 MIL/uL (ref 3.87–5.11)
RDW: 12.4 % (ref 11.5–15.5)
WBC: 4.6 10*3/uL (ref 4.0–10.5)
nRBC: 0 % (ref 0.0–0.2)

## 2021-03-18 MED ORDER — ENSURE ENLIVE PO LIQD
237.0000 mL | Freq: Every day | ORAL | Status: DC
  Administered 2021-03-18 – 2021-03-23 (×6): 237 mL via ORAL

## 2021-03-18 MED ORDER — JUVEN PO PACK
1.0000 | PACK | Freq: Two times a day (BID) | ORAL | Status: DC
  Administered 2021-03-18 – 2021-03-22 (×9): 1 via ORAL
  Filled 2021-03-18 (×10): qty 1
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
## 2021-03-18 NOTE — Progress Notes (Signed)
Inpatient Rehabilitation  Patient information reviewed and entered into eRehab system by Kimiya Brunelle Rhian Funari, OTR/L.   Information including medical coding, functional ability and quality indicators will be reviewed and updated through discharge.    

## 2021-03-18 NOTE — Progress Notes (Signed)
PROGRESS NOTE   Subjective/Complaints: Patient's chart reviewed- No issues reported overnight Vitals signs stable  Discussed that Creatinine is improving, encouraged 6-8 glasses of water per day  ROS: +left visual field cut  Objective:   No results found. Recent Labs    03/18/21 0505  WBC 4.6  HGB 14.0  HCT 41.2  PLT 327   Recent Labs    03/16/21 0404 03/18/21 0505  NA 137 141  K 3.9 3.8  CL 103 105  CO2 28 27  GLUCOSE 103* 124*  BUN 11 12  CREATININE 1.13* 1.11*  CALCIUM 9.5 9.5    Intake/Output Summary (Last 24 hours) at 03/18/2021 0958 Last data filed at 03/18/2021 0720 Gross per 24 hour  Intake 480 ml  Output --  Net 480 ml        Physical Exam: Vital Signs Blood pressure 103/73, pulse (!) 56, temperature 99.2 F (37.3 C), temperature source Oral, resp. rate 18, height 4\' 11"  (1.499 m), weight 66.6 kg, SpO2 100 %. Gen: no distress, normal appearing HEENT: oral mucosa pink and moist, NCAT Cardio: Bradycardia Chest: normal effort, normal rate of breathing Abd: soft, non-distended Ext: no edema Psych: pleasant, normal affect Skin: intact Neurological:    Comments: Patient is alert.  No acute distress.  Speech is slightly dysarthric but intelligible.  Right gaze preference.  Provides name and age.  Follows simple commands. 5/5 strength throughout. Decreased sensation on left side. Decreased awareness of deficits.      Assessment/Plan: 1. Functional deficits which require 3+ hours per day of interdisciplinary therapy in a comprehensive inpatient rehab setting. Physiatrist is providing close team supervision and 24 hour management of active medical problems listed below. Physiatrist and rehab team continue to assess barriers to discharge/monitor patient progress toward functional and medical goals  Care Tool:  Bathing              Bathing assist       Upper Body  Dressing/Undressing Upper body dressing   What is the patient wearing?: Hospital gown only    Upper body assist Assist Level: Moderate Assistance - Patient 50 - 74%    Lower Body Dressing/Undressing Lower body dressing      What is the patient wearing?: Underwear/pull up, Pants     Lower body assist Assist for lower body dressing: Minimal Assistance - Patient > 75%     Toileting Toileting    Toileting assist Assist for toileting: Minimal Assistance - Patient > 75%     Transfers Chair/bed transfer  Transfers assist           Locomotion Ambulation   Ambulation assist      Assist level: Minimal Assistance - Patient > 75%       Walk 10 feet activity   Assist           Walk 50 feet activity   Assist    Assist level: Minimal Assistance - Patient > 75%      Walk 150 feet activity   Assist           Walk 10 feet on uneven surface  activity   Assist  Wheelchair     Assist               Wheelchair 50 feet with 2 turns activity    Assist            Wheelchair 150 feet activity     Assist          Blood pressure 103/73, pulse (!) 56, temperature 99.2 F (37.3 C), temperature source Oral, resp. rate 18, height 4\' 11"  (1.499 m), weight 66.6 kg, SpO2 100 %.  Medical Problem List and Plan: 1.  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  Plan 30-day cardiac event monitor             -patient may shower             -ELOS/Goals: 5-7 days S to modI             -Initial CIR evaluations today 2.  Impaired mobility -DVT/anticoagulation: Continue Lovenox             -antiplatelet therapy: Aspirin 81 mg daily 3. Pain Management: Tylenol as needed 4. Mood: Provide emotional support             -antipsychotic agents: N/A 5. Neuropsych: This patient is capable of making decisions on her own behalf. 6. Skin/Wound Care: Routine skin  checks 7. Fluids/Electrolytes/Nutrition: Routine in and out with follow-up chemistries 8.  Seizure prophylaxis.  Discontinue Keppra since one week seizure free. EEG negative 9.  Tobacco/polysubstance abuse.  Urine drug screen positive cocaine.  Provide counseling 10.  Hyperlipidemia.  Continue Lipitor 11. AKI: placed nursing order for 6-8 glasses of water per day, discussed with nursing.  12. Overweight BMI 29.66: provide dietary education.  13. Disposition: messaged Lisa to schedule f/u with me. Patient needs f/u scheduled with new PCP as well.     LOS: 1 days A FACE TO FACE EVALUATION WAS PERFORMED  Brandy Nguyen 03/18/2021, 9:58 AM

## 2021-03-18 NOTE — Progress Notes (Signed)
30 days event monitor ordered for CVA per family medicine request. Will make appointment for the patient to establish car with Dr Allyson Sabal (DOD today).

## 2021-03-18 NOTE — Progress Notes (Signed)
Initial Nutrition Assessment  DOCUMENTATION CODES:   Not applicable  INTERVENTION:  Continue Juven BID, each packet provides 95 calories, 2.5 grams of protein.  Provide Ensure Enlive po once daily, each supplement provides 350 kcal and 20 grams of protein.  Encourage adequate PO intake.   NUTRITION DIAGNOSIS:   Increased nutrient needs related to  (therapy) as evidenced by estimated needs.  GOAL:   Patient will meet greater than or equal to 90% of their needs  MONITOR:   PO intake, Supplement acceptance, Skin, Weight trends, Labs, I & O's  REASON FOR ASSESSMENT:   Malnutrition Screening Tool    ASSESSMENT:   55 year old right-handed female with history of tobacco use as well as hyperlipidemia. Presented 03/10/2021 with left-sided weakness x3 days as well as blurred vision. CT/MRI showed findings consistent with large acute early subacute right posterior MCA territory infarction with exuberant cortical edema, extensive petechial hemorrhage and gyriform enhancement likely related to blood-brain barrier breakdown. Pt with decreased functional ability left-sided weakness admitted to CIR.  Meal completion 10-100%. Pt reports having a good appetite currently and prior to admission with usual consumption of at least 3 meals a day with no difficulties. MD has ordered juven. RD to additionally order Ensure to aid in caloric and protein needs. Pt encouraged to eat her food at meals and to drink her supplements.   NUTRITION - FOCUSED PHYSICAL EXAM:  Flowsheet Row Most Recent Value  Orbital Region No depletion  Upper Arm Region No depletion  Thoracic and Lumbar Region No depletion  Buccal Region No depletion  Temple Region No depletion  Clavicle Bone Region No depletion  Clavicle and Acromion Bone Region No depletion  Scapular Bone Region No depletion  Dorsal Hand No depletion  Patellar Region No depletion  Anterior Thigh Region No depletion  Posterior Calf Region No depletion   Edema (RD Assessment) None  Hair Reviewed  Eyes Reviewed  Mouth Reviewed  Skin Reviewed  Nails Reviewed      Labs and medications reviewed.   Diet Order:   Diet Order             Diet regular Room service appropriate? Yes; Fluid consistency: Thin  Diet effective now                   EDUCATION NEEDS:   Not appropriate for education at this time  Skin:  Skin Assessment: Reviewed RN Assessment  Last BM:  6/13  Height:   Ht Readings from Last 1 Encounters:  03/17/21 4\' 11"  (1.499 m)    Weight:   Wt Readings from Last 1 Encounters:  03/17/21 66.6 kg   BMI:  Body mass index is 29.66 kg/m.  Estimated Nutritional Needs:   Kcal:  1800-2000  Protein:  80-100 grams  Fluid:  >/= 1.8 L/day  03/19/21, MS, RD, LDN RD pager number/after hours weekend pager number on Amion.

## 2021-03-18 NOTE — Evaluation (Signed)
Occupational Therapy Assessment and Plan  Patient Details  Name: Brandy Nguyen MRN: [id number] Date of Birth: [birth year]  OT Diagnosis: disturbance of vision and hemiplegia affecting non-dominant side Rehab Potential: Rehab Potential (ACUTE ONLY): Excellent ELOS: 6-7 days   Today's Date: 03/18/2021 OT Individual Time: 2831-5176 OT Individual Time Calculation (min): 60 min     Hospital Problem: Principal Problem:   Right middle cerebral artery stroke [facility])   Past Medical History:  Past Medical History:  Diagnosis Date   Hyperlipidemia 03/11/2021   Past Surgical History: History reviewed. No pertinent surgical history.  Assessment & Plan Clinical Impression: Brandy Nguyen is a 55 year old right-handed female with history of tobacco use as well as hyperlipidemia and has not seen an MD in many years.  Per chart review patient lives with spouse.  Independent prior to admission.  1 level apartment 4 steps to entry.  Presented 03/10/2021 with left-sided weakness x3 days as well as blurred vision.  Family had reported a 45-second episode of shaking which resolved.  Denied loss of consciousness.  Denied any bowel or bladder incontinence.  CT/MRI showed findings consistent with large acute early subacute right posterior MCA territory infarction with exuberant cortical edema, extensive petechial hemorrhage and gyriform enhancement likely related to blood-brain barrier breakdown.  Sulcal/regional mass-effect without midline shift.  CT angiogram of head and neck no large vessel occlusion.  Noncalcified plaque causing less than 50% stenosis at the right ICA origin.  Patient did not receive tPA.  EEG negative for seizure.  Echocardiogram with ejection fraction of 70 to 75% no wall motion abnormalities.  Admission chemistries unremarkable except WBC of 13,200, potassium 3.3, urine drug screen positive cocaine.  Neurology follow-up currently maintained on low-dose aspirin for CVA prophylaxis.  Patient was  cleared to begin Lovenox for DVT prophylaxis 03/12/2021.  Recommendations of 30-day cardiac event monitor.  She does remain on Keppra for seizure prophylaxis.  Tolerating a regular diet.  Therapy evaluations completed due to patient's decreased functional ability left-sided weakness recommendations of physical medicine rehab consult. She is very pleasant and highly motivated for therapy!  Patient transferred to CIR on 03/17/2021 .    Patient currently requires supervision with basic self-care skills secondary to decreased coordination, hemianopsia, and decreased standing balance, hemiplegia, and decreased balance strategies.  Prior to hospitalization, patient was fully independent.  Patient will benefit from skilled intervention to increase independence with basic self-care skills and increase level of independence with iADL prior to discharge home with care partner.  Anticipate patient will require intermittent supervision and follow up home health.  OT - End of Session Activity Tolerance: Tolerates 30+ min activity without fatigue Endurance Deficit: Yes Endurance Deficit Description: did need to rest post ADLs this am.  blood pressure 109/67 OT Assessment Rehab Potential (ACUTE ONLY): Excellent OT Patient demonstrates impairments in the following area(s): Balance;Endurance;Motor;Vision;Safety;Perception OT Basic ADL's Functional Problem(s): Bathing;Dressing;Toileting OT Advanced ADL's Functional Problem(s): Light Housekeeping OT Transfers Functional Problem(s): Toilet;Tub/Shower OT Additional Impairment(s): None OT Plan OT Intensity: Minimum of 1-2 x/day, 45 to 90 minutes OT Frequency: 5 out of 7 days OT Duration/Estimated Length of Stay: 6-7 days OT Treatment/Interventions: Balance/vestibular training;Discharge planning;Functional mobility training;Neuromuscular re-education;Patient/family education;Psychosocial support;Self Care/advanced ADL retraining;Therapeutic Activities;Therapeutic  Exercise;UE/LE Strength taining/ROM;Visual/perceptual remediation/compensation OT Self Feeding Anticipated Outcome(s): independent OT Basic Self-Care Anticipated Outcome(s): Mod I OT Toileting Anticipated Outcome(s): Mod I OT Bathroom Transfers Anticipated Outcome(s): Mod I OT Recommendation Patient destination: Home Follow Up Recommendations: Home health OT Equipment Recommended: Tub/shower seat   OT Evaluation  Precautions/Restrictions  Precautions Precautions: Fall Precaution Comments: L hemianopsia Restrictions Weight Bearing Restrictions: No  Pain Pain Assessment Pain Scale: 0-10 Pain Score: 0-No pain Home Living/Prior Functioning Home Living Family/patient expects to be discharged to:: Private residence Living Arrangements: Alone Available Help at Discharge: Friend(s), Available 24 hours/day Type of Home: Apartment Home Access: Stairs to enter [employer] of Steps: 4 Entrance Stairs-Rails: Left Home Layout: One level Bathroom Shower/Tub: Tub/shower unit, Industrial/product designer: Yes  Lives With: Significant other Prior Function Level of Independence: Independent with basic ADLs, Independent with homemaking with ambulation, Independent with gait, Independent with transfers  Able to Take Stairs?: Yes Driving: No (has not driven in many years (lost license)) Vocation: Unemployed Vocation Requirements: 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 Comments: very active daily with frequent housekeeping Vision Baseline Vision/History: Wears glasses Wears Glasses: Reading only Patient Visual Report: Blurring of vision;Peripheral vision impairment Vision Assessment?: Yes Ocular Range of Motion: Within Functional Limits Alignment/Gaze Preference: Gaze right (slight R gaze preference, responding to forced scanning to L well) Tracking/Visual Pursuits: Able to track stimulus in all quads without  difficulty Visual Fields: Left homonymous hemianopsia Depth Perception:  (WFL during finger nose test) Additional Comments: left visual field cut to 90 degrees Perception  Perception: Impaired Inattention/Neglect: Does not attend to left visual field;Does not attend to left side of body Praxis Praxis: Intact Cognition Overall Cognitive Status: Within Functional Limits for tasks assessed Arousal/Alertness: Awake/alert Orientation Level: Person;Place;Situation Person: Oriented Place: Oriented Situation: Oriented Year: 2022 Month: June Day of Week: Correct Memory: Appears intact Immediate Memory Recall: Sock;Blue;Bed Memory Recall Sock: With Cue Memory Recall Blue: Without Cue Memory Recall Bed: Without Cue Focused Attention: Appears intact Sustained Attention: Appears intact Awareness: Appears intact (pt is aware she has a full field cut loss and needs to turn her head fully) Safety/Judgment: Appears intact (pt moves very cautiously) Sensation Sensation Light Touch: Appears Intact Hot/Cold: Appears Intact Proprioception: Appears Intact Stereognosis: Appears Intact Coordination Gross Motor Movements are Fluid and Coordinated: No Fine Motor Movements are Fluid and Coordinated: No Coordination and Movement Description: slower on L side Finger Nose Finger Test: able to complete test accurately on B sides, but slow rate bilaterally Motor  Motor Motor: Hemiplegia Motor - Skilled Clinical Observations: mild L hemiplegia  Trunk/Postural Assessment  Cervical Assessment Cervical Assessment: Within Functional Limits Thoracic Assessment Thoracic Assessment: Within Functional Limits Lumbar Assessment Lumbar Assessment: Within Functional Limits Postural Control Postural Control: Within Functional Limits  Balance Dynamic Sitting Balance Dynamic Sitting - Level of Assistance: 5: Stand by assistance Static Standing Balance Static Standing - Level of Assistance: 5: Stand by  assistance Dynamic Standing Balance Dynamic Standing - Level of Assistance: 4: Min assist Extremity/Trunk Assessment RUE Assessment RUE Assessment: Within Functional Limits LUE Assessment General Strength Comments: sh flexion and abduction 3+/5; functional distally  Care Tool Care Tool Self Care Eating   Eating Assist Level: Independent    Oral Care    Oral Care Assist Level: Supervision/Verbal cueing (standing at sink)    Bathing     Body parts bathed by helper: Right arm;Left arm;Chest;Abdomen;Front perineal area;Buttocks;Right upper leg;Left upper leg;Right lower leg;Left lower leg;Face   Assist Level: Supervision/Verbal cueing    Upper Body Dressing(including orthotics)   What is the patient wearing?: Pull over shirt   Assist Level: Supervision/Verbal cueing    Lower Body Dressing (excluding footwear)   What is the patient wearing?: Underwear/pull up;Pants Assist for  lower body dressing: Minimal Assistance - Patient > 75%    Putting on/Taking off footwear   What is the patient wearing?: Shoes Assist for footwear: Set up assist       Care Tool Toileting Toileting activity   Assist for toileting: Supervision/Verbal cueing     Care Tool Bed Mobility Roll left and right activity   Roll left and right assist level: Independent    Sit to lying activity   Sit to lying assist level: Supervision/Verbal cueing    Lying to sitting edge of bed activity   Lying to sitting edge of bed assist level: Supervision/Verbal cueing     Care Tool Transfers Sit to stand transfer   Sit to stand assist level: Supervision/Verbal cueing    Chair/bed transfer   Chair/bed transfer assist level: Contact Guard/Touching assist     Toilet transfer   Assist Level: Contact Guard/Touching assist     Care Tool Cognition Expression of Ideas and Wants Expression of Ideas and Wants: Without difficulty (complex and basic) - expresses complex messages without difficulty and with speech that  is clear and easy to understand   Understanding Verbal and Non-Verbal Content Understanding Verbal and Non-Verbal Content: Understands (complex and basic) - clear comprehension without cues or repetitions   Memory/Recall Ability *first 3 days only Memory/Recall Ability *first 3 days only: Current season;Location of own room;Staff names and faces;That he or she is in a hospital/hospital unit    Refer to Care Plan for [place] 1 OT Short Term Goal 1 (Week 1): STGs = LTGs  Recommendations for other services: None    Skilled Therapeutic Intervention ADL ADL Eating: Independent Grooming: Supervision/safety Where Assessed-Grooming: Standing at sink Upper Body Bathing: Supervision/safety Where Assessed-Upper Body Bathing: Shower Lower Body Bathing: Supervision/safety Where Assessed-Lower Body Bathing: Shower Upper Body Dressing: Supervision/safety Where Assessed-Upper Body Dressing: Edge of bed Lower Body Dressing: Minimal assistance Where Assessed-Lower Body Dressing: Edge of bed Toileting: Supervision/safety Where Assessed-Toileting: Glass blower/designer: Therapist, music Method: Product/process development scientist Method: Heritage manager: Transfer tub bench;Grab bars    Pt seen for initial evaluation and ADL training with a focus on balance and L visual scanning.  Discussed role of OT, POC, ELOS, pt's goals.  Pt had a lot of questions about when she could return to work and advised pt to focus on getting healthy and getting as independent as possible with IADLs first.  Pt did very well this morning and is quite motivated. She completed grooming at the sink, shower, toileting, dressing all with close S and CGA with ambulation.  Pt was fatigued at end of session.  Opted to rest in bed.  Bed alarm set and all needs met.   Discharge Criteria: Patient will be discharged from OT if  patient refuses treatment 3 consecutive times without medical reason, if treatment goals not met, if there is a change in medical status, if patient makes no progress towards goals or if patient is discharged from hospital.  The above assessment, treatment plan, treatment alternatives and goals were discussed and mutually agreed upon: by patient  [facility] 03/18/2021, 11:41 AM

## 2021-03-18 NOTE — Discharge Instructions (Addendum)
Inpatient Rehab Discharge Instructions  LAURALEE WATERS Discharge date and time: No discharge date for patient encounter.   Activities/Precautions/ Functional Status: Activity: activity as tolerated Diet: regular diet Wound Care: Routine skin checks Functional status:  ___ No restrictions     ___ Walk up steps independently ___ 24/7 supervision/assistance   ___ Walk up steps with assistance ___ Intermittent supervision/assistance  ___ Bathe/dress independently ___ Walk with walker     __x_ Bathe/dress with assistance ___ Walk Independently    ___ Shower independently ___ Walk with assistance    ___ Shower with assistance ___ No alcohol     ___ Return to work/school ________  Special Instructions: No driving smoking or alcohol    COMMUNITY REFERRALS UPON DISCHARGE:     HOME EXERCISE PROGRAM GIVEN TO PATIENT  Medical Equipment/Items Ordered:NONE NEEDED                                                 Agency/Supplier:NA  GENERAL COMMUNITY RESOURCES FOR PATIENT/FAMILY:    APPLIED FOR DISABILITY PHONE INTERVIEW 9:00 AM 6/21 MEDICAID APPLICATION STARTED BY [employer] Cathlean [place]se[place] [phone]  STROKE/TIA DISCHARGE INSTRUCTIONS SMOKING Cigarette smoking nearly doubles your risk of having a stroke & is the single most alterable risk factor  If you smoke or have smoked in the last 12 months, you are advised to quit smoking for your health. Most of the excess cardiovascular risk related to smoking disappears within a year of stopping. Ask you doctor about anti-smoking medications [place] Quit Line: 1-800-QUIT NOW Free Smoking Cessation Classes [phone]  CHOLESTEROL Know your levels; limit fat & cholesterol in your diet  Lipid Panel     Component Value Date/Time   CHOL 234 (H) 03/11/2021 0500   TRIG 56 03/11/2021 0500   HDL 81 03/11/2021 0500   CHOLHDL 2.9 03/11/2021 0500   VLDL 11 03/11/2021 0500   LDLCALC 142 (H) 03/11/2021 0500     Many patients benefit from treatment  even if their cholesterol is at goal. Goal: Total Cholesterol (CHOL) less than 160 Goal:  Triglycerides (TRIG) less than 150 Goal:  HDL greater than 40 Goal:  LDL (LDLCALC) less than 100   BLOOD PRESSURE American Stroke Association blood pressure target is less that 120/80 mm/Hg  Your discharge blood pressure is:  BP: 103/73 Monitor your blood pressure Limit your salt and alcohol intake Many individuals will require more than one medication for high blood pressure  DIABETES (A1c is a blood sugar average for last 3 months) Goal HGBA1c is under 7% (HBGA1c is blood sugar average for last 3 months)  Diabetes: No known diagnosis of diabetes    Lab Results  Component Value Date   HGBA1C 5.3 03/11/2021    Your HGBA1c can be lowered with medications, healthy diet, and exercise. Check your blood sugar as directed by your physician Call your physician if you experience unexplained or low blood sugars.  PHYSICAL ACTIVITY/REHABILITATION Goal is 30 minutes at least 4 days per week  Activity: Increase activity slowly, Therapies: Physical Therapy: Home Health Return to work:  Activity decreases your risk of heart attack and stroke and makes your heart stronger.  It helps control your weight and blood pressure; helps you relax and can improve your mood. Participate in a regular exercise program. Talk with your doctor about the best form of exercise for you (  dancing, walking, swimming, cycling).  DIET/WEIGHT Goal is to maintain a healthy weight  Your discharge diet is:  Diet Order             Diet regular Room service appropriate? Yes; Fluid consistency: Thin  Diet effective now                   liquids Your height is:  Height: 4\' 11"  (149.9 cm) Your current weight is: Weight: 66.6 kg Your Body Mass Index (BMI) is:  BMI (Calculated): 29.64 Following the type of diet specifically designed for you will help prevent another stroke. Your goal weight range is:   Your goal Body Mass Index (BMI)  is 19-24. Healthy food habits can help reduce 3 risk factors for stroke:  High cholesterol, hypertension, and excess weight.  RESOURCES Stroke/Support Group:  Call [phone]   STROKE EDUCATION PROVIDED/REVIEWED AND GIVEN TO PATIENT Stroke warning signs and symptoms How to activate emergency medical system (call 911). Medications prescribed at discharge. Need for follow-up after discharge. Personal risk factors for stroke. Pneumonia vaccine given: No Flu vaccine given: No My questions have been answered, the writing is legible, and I understand these instructions.  I will adhere to these goals & educational materials that have been provided to me after my discharge from the hospital.       My questions have been answered and I understand these instructions. I will adhere to these goals and the provided educational materials after my discharge from the hospital.  Patient/Caregiver Signature _______________________________ Date __________  Clinician Signature _______________________________________ Date __________  Please bring this form and your medication list with you to all your follow-up doctor's appointments.

## 2021-03-18 NOTE — Evaluation (Signed)
Physical Therapy Assessment and Plan  Patient Details  Name: Brandy Nguyen MRN: [id number] Date of Birth: 20-Jan-1966  PT Diagnosis: Abnormality of gait, Difficulty walking, and Hemiparesis non-dominant Rehab Potential: Good ELOS: 5-7 days   Today's Date: 03/18/2021 PT Individual Time: 1045-1200 PT Individual Time Calculation (min): 75 min    Hospital Problem: Principal Problem:   Right middle cerebral artery stroke [facility])   Past Medical History:  Past Medical History:  Diagnosis Date   Hyperlipidemia 03/11/2021   Past Surgical History: History reviewed. No pertinent surgical history.  Assessment & Plan Clinical Impression: Patient is a 55 year old right-handed female with history of tobacco use as well as hyperlipidemia and has not seen an MD in many years.  Per chart review patient lives with spouse.  Independent prior to admission.  1 level apartment 4 steps to entry.  Presented 03/10/2021 with left-sided weakness x3 days as well as blurred vision.  Family had reported a 45-second episode of shaking which resolved.  Denied loss of consciousness.  Denied any bowel or bladder incontinence.  CT/MRI showed findings consistent with large acute early subacute right posterior MCA territory infarction with exuberant cortical edema, extensive petechial hemorrhage and gyriform enhancement likely related to blood-brain barrier breakdown.  Sulcal/regional mass-effect without midline shift.  CT angiogram of head and neck no large vessel occlusion.  Noncalcified plaque causing less than 50% stenosis at the right ICA origin.  Patient did not receive tPA.  EEG negative for seizure.  Echocardiogram with ejection fraction of 70 to 75% no wall motion abnormalities.  Admission chemistries unremarkable except WBC of 13,200, potassium 3.3, urine drug screen positive cocaine.  Neurology follow-up currently maintained on low-dose aspirin for CVA prophylaxis.  Patient was cleared to begin Lovenox for DVT prophylaxis  03/12/2021.  Recommendations of 30-day cardiac event monitor.  She does remain on Keppra for seizure prophylaxis.  Tolerating a regular diet.  Therapy evaluations completed due to patient's decreased functional ability left-sided weakness recommendations of physical medicine rehab consult. She is very pleasant and highly motivated for therapy! Patient transferred to CIR on 03/17/2021 .   Patient currently requires  CGA  with mobility secondary to muscle weakness, decreased cardiorespiratoy endurance, unbalanced muscle activation, field cut, and decreased standing balance, hemiplegia, and decreased balance strategies.  Prior to hospitalization, patient was independent  with mobility and lived with Significant other in a [place] home.  Home access is 4Stairs to enter.  Patient will benefit from skilled PT intervention to maximize safe functional mobility, minimize fall risk, and decrease caregiver burden for planned discharge home with intermittent assist.  Anticipate patient will benefit from follow up OP at discharge.  PT - End of Session Activity Tolerance: Tolerates 30+ min activity with multiple rests Endurance Deficit: Yes Endurance Deficit Description: brief seated rest breaks needed b/w long bouts of ambulation and functional mobility training PT Assessment Rehab Potential (ACUTE/IP ONLY): Good PT Barriers to Discharge: Lack of/limited family support;Insurance for SNF coverage;Decreased caregiver support PT Barriers to Discharge Comments: L visual field cut PT Patient demonstrates impairments in the following area(s): Balance;Endurance;Motor;Pain PT Transfers Functional Problem(s): Bed Mobility;Bed to Chair;Car PT Locomotion Functional Problem(s): Ambulation;Stairs PT Plan PT Intensity: Minimum of 1-2 x/day ,45 to 90 minutes PT Frequency: 5 out of 7 days PT Duration Estimated Length of Stay: 5-7 days PT Treatment/Interventions: Ambulation/gait training;Discharge planning;Functional mobility  training;Psychosocial support;Therapeutic Activities;Visual/perceptual remediation/compensation;Wheelchair propulsion/positioning;Therapeutic Exercise;Skin care/wound management;Neuromuscular re-education;Disease management/prevention;Balance/vestibular training;Cognitive remediation/compensation;DME/adaptive equipment instruction;Pain management;Splinting/orthotics;UE/LE Strength taining/ROM;UE/LE Coordination activities;Stair training;Patient/family education;Functional electrical stimulation;Community reintegration PT Transfers  Anticipated Outcome(s): mod I with LRAD PT Locomotion Anticipated Outcome(s): mod I with LRAD PT Recommendation Recommendations for Other Services: Neuropsych consult Follow Up Recommendations: Outpatient PT Patient destination: Home Equipment Recommended: To be determined Equipment Details: Pt owns no DME   PT Evaluation Precautions/Restrictions Precautions Precautions: Fall Precaution Comments: L hemianopsia Restrictions Weight Bearing Restrictions: No General   Vital Signs Pain Pain Assessment Pain Score: 0-No pain Home Living/Prior Functioning Home Living Living Arrangements: Alone Available Help at Discharge: Friend(s);Available 24 hours/day (daughter and son providing 24/7) Type of Home: Apartment Home Access: Stairs to enter [employer] of Steps: 4 Entrance Stairs-Rails: None Home Layout: One level Bathroom Shower/Tub: Product/process development scientist: Standard Bathroom Accessibility: Yes  Lives With: Significant other Prior Function Level of Independence: Independent with basic ADLs;Independent with homemaking with ambulation;Independent with gait;Independent with transfers  Able to Take Stairs?: Yes Driving: No (takes public transportation) Vocation: Unemployed Vocation Requirements: 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 Comments: very active daily with frequent  housekeeping Vision/Perception  Vision - Assessment Ocular Range of Motion: Within Functional Limits Alignment/Gaze Preference: Gaze right (slight R gaze preference, responding to forced scanning to L well) Tracking/Visual Pursuits: Able to track stimulus in all quads without difficulty Additional Comments: left visual field cut to 90 degrees Perception Perception: Impaired Inattention/Neglect: Does not attend to left visual field;Does not attend to left side of body Praxis Praxis: Intact  Cognition Overall Cognitive Status: Within Functional Limits for tasks assessed Arousal/Alertness: Awake/alert Orientation Level: Oriented X4 Focused Attention: Appears intact Sustained Attention: Appears intact Memory: Appears intact Immediate Memory Recall: Sock;Blue;Bed Memory Recall Sock: With Cue Memory Recall Blue: Without Cue Memory Recall Bed: Without Cue Awareness: Appears intact Problem Solving: Appears intact Safety/Judgment: Appears intact Sensation Sensation Light Touch: Appears Intact Hot/Cold: Appears Intact Proprioception: Appears Intact Stereognosis: Appears Intact Coordination Gross Motor Movements are Fluid and Coordinated: No Fine Motor Movements are Fluid and Coordinated: No Coordination and Movement Description: slower on L side Finger Nose Finger Test: able to complete test accurately on B sides, but slow rate bilaterally Heel Shin Test: WNL Motor  Motor Motor: Hemiplegia Motor - Skilled Clinical Observations: mild L hemiplegia   Trunk/Postural Assessment  Cervical Assessment Cervical Assessment: Within Functional Limits Thoracic Assessment Thoracic Assessment: Within Functional Limits Lumbar Assessment Lumbar Assessment: Within Functional Limits Postural Control Postural Control: Within Functional Limits  Balance Balance Balance Assessed: Yes Dynamic Sitting Balance Dynamic Sitting - Level of Assistance: 5: Stand by assistance Static Standing  Balance Static Standing - Level of Assistance: 5: Stand by assistance Dynamic Standing Balance Dynamic Standing - Level of Assistance: 4: Min assist Extremity Assessment  RUE Assessment RUE Assessment: Within Functional Limits LUE Assessment General Strength Comments: sh flexion and abduction 3+/5; functional distally RLE Assessment RLE Assessment: Exceptions to [facility] General Strength Comments: Grossly 4/5 LLE Assessment LLE Assessment: Exceptions to [facility] General Strength Comments: Grossly 4-/5  Care Tool Care Tool Bed Mobility Roll left and right activity   Roll left and right assist level: Independent    Sit to lying activity   Sit to lying assist level: Supervision/Verbal cueing    Lying to sitting edge of bed activity   Lying to sitting edge of bed assist level: Supervision/Verbal cueing     Care Tool Transfers Sit to stand transfer   Sit to stand assist level: Supervision/Verbal cueing    Chair/bed transfer   Chair/bed transfer assist level: Contact Guard/Touching assist  Toilet transfer   Assist Level: Associate Professor transfer assist level: Contact Guard/Touching assist      Care Tool Locomotion Ambulation   Assist level: Contact Guard/Touching assist Assistive device: No Device Max distance: 345f  Walk 10 feet activity   Assist level: Contact Guard/Touching assist Assistive device: No Device   Walk 50 feet with 2 turns activity   Assist level: Contact Guard/Touching assist Assistive device: No Device  Walk 150 feet activity   Assist level: Contact Guard/Touching assist Assistive device: No Device  Walk 10 feet on uneven surfaces activity   Assist level: Contact Guard/Touching assist    Stairs   Assist level: Contact Guard/Touching assist Stairs assistive device: 2 hand rails Max number of stairs: 12  Walk up/down 1 step activity   Walk up/down 1 step (curb) assist level: Contact Guard/Touching assist Walk  up/down 1 step or curb assistive device: 2 hand rails    Walk up/down 4 steps activity Walk up/down 4 steps assist level: Contact Guard/Touching assist Walk up/down 4 steps assistive device: 2 hand rails  Walk up/down 12 steps activity   Walk up/down 12 steps assist level: Contact Guard/Touching assist Walk up/down 12 steps assistive device: 2 hand rails  Pick up small objects from floor   Pick up small object from the floor assist level: Contact Guard/Touching assist    Wheelchair Will patient use wheelchair at discharge?: No   Wheelchair activity did not occur: N/A      Wheel 50 feet with 2 turns activity Wheelchair 50 feet with 2 turns activity did not occur: N/A    Wheel 150 feet activity Wheelchair 150 feet activity did not occur: N/A      Refer to Care Plan for Long Term Goals  SHORT TERM GOAL WEEK 1 PT Short Term Goal 1 (Week 1): STG = LTG due to ELOS  Recommendations for other services: Neuropsych  Skilled Therapeutic Intervention Mobility Bed Mobility Bed Mobility: Rolling Right;Rolling Left;Supine to Sit;Sit to Supine Rolling Right: Independent Rolling Left: Independent Supine to Sit: Supervision/Verbal cueing Sit to Supine: Supervision/Verbal cueing Transfers Transfers: Sit to Stand;Stand Pivot Transfers;Stand to Sit Sit to Stand: Contact Guard/Touching assist Stand to Sit: Contact Guard/Touching assist Stand Pivot Transfers: Contact Guard/Touching assist Transfer (Assistive device): None Locomotion  Gait Ambulation: Yes Gait Assistance: Contact Guard/Touching assist Gait Distance (Feet): 300 Feet Assistive device: None Gait Gait: Yes Gait Pattern: Impaired Gait Pattern: Step-through pattern;Decreased step length - right;Decreased step length - left;Narrow base of support;Trunk flexed (arms in high guard with limited arm swing) Gait velocity: decreasd Stairs / Additional Locomotion Stairs: Yes Stairs Assistance: Contact Guard/Touching assist Stair  Management Technique: Two rails Number of Stairs: 12 Height of Stairs: 6 Ramp: Contact Guard/touching assist Curb: Contact Guard/Touching assist Wheelchair Mobility Wheelchair Mobility: No  Skilled Intervention: Pt received supine in bed to start session and agreeable to PT evaluation. Pt pleasant and cooperative, oriented x4. Initiated functional mobility as outlined above. She can complete bed mobility with supervision, sit<>stand transfers with CGA, and ambulated >3081fwith CGA and no AD in hallways. She was able to navigate up/down x12 steps with CGA and 2 hand rails. Her 5xSTS score was 29.5 seconds (>15 seconds indicates increased falls risk). She also worked on repeated stands with task overlay for card matching with bias to her L side to promote L visual field. She also worked on fuConservation officer, historic buildingsn hallway with instruction to find, locate, and retrieve bright  orange cones placed along hand rail (bias to L) - pt able to find and retrieve all cones with 100% accuracy with good ability to visual scan to the L. She ended session supine in bed with bed alarm on and all needs within reach at end of session.   Instructed pt in results of PT evaluation as detailed above, PT POC, rehab potential, rehab goals, and discharge recommendations. Additionally discussed CIR's policies regarding fall safety and use of chair alarm and/or quick release belt. Pt verbalized understanding and in agreement. Will update pt's family members as they become available.   Discharge Criteria: Patient will be discharged from PT if patient refuses treatment 3 consecutive times without medical reason, if treatment goals not met, if there is a change in medical status, if patient makes no progress towards goals or if patient is discharged from hospital.  The above assessment, treatment plan, treatment alternatives and goals were discussed and mutually agreed upon: by patient  Alger Simons PT, DPT 03/18/2021,  12:31 PM

## 2021-03-18 NOTE — Progress Notes (Signed)
Inpatient Rehabilitation Center Individual Statement of Services  Patient Name:  Brandy Nguyen  Date:  03/18/2021  Welcome to the Inpatient Rehabilitation Center.  Our goal is to provide you with an individualized program based on your diagnosis and situation, designed to meet your specific needs.  With this comprehensive rehabilitation program, you will be expected to participate in at least 3 hours of rehabilitation therapies Monday-Friday, with modified therapy programming on the weekends.  Your rehabilitation program will include the following services:  Physical Therapy (PT), Occupational Therapy (OT), Speech Therapy (ST), 24 hour per day rehabilitation nursing, Neuropsychology, Care Coordinator, Rehabilitation Medicine, Nutrition Services, and Pharmacy Services  Weekly team conferences will be held on Tuesday to discuss your progress.  Your Inpatient Rehabilitation Care Coordinator will talk with you frequently to get your input and to update you on team discussions.  Team conferences with you and your family in attendance may also be held.  Expected length of stay: 5-7 DAYS  Overall anticipated outcome: Independent level  Depending on your progress and recovery, your program may change. Your Inpatient Rehabilitation Care Coordinator will coordinate services and will keep you informed of any changes. Your Inpatient Rehabilitation Care Coordinator's name and contact numbers are listed  below.  The following services may also be recommended but are not provided by the Inpatient Rehabilitation Center:   Home Health Rehabiltiation Services Outpatient Rehabilitation Services    Arrangements will be made to provide these services after discharge if needed.  Arrangements include referral to agencies that provide these services.  Your insurance has been verified to be:  None Your primary doctor is:  None  Pertinent information will be shared with your doctor and your insurance  company.  Inpatient Rehabilitation Care Coordinator:  Dossie Der, [place] [phone] or Luna [place]  Information discussed with and copy given to patient by: Lucy Chris, 03/18/2021, 10:58 AM

## 2021-03-18 NOTE — Evaluation (Signed)
Speech Language Pathology Assessment and Plan  Patient Details  Name: JATOYA ARMBRISTER MRN: [id number] Date of Birth: Oct 15, 1965  SLP Diagnosis: Cognitive Impairments  Rehab Potential: Excellent ELOS: 6-7 days    Today's Date: 03/18/2021 SLP Individual Time: 1300-1400 SLP Individual Time Calculation (min): 60 min   Hospital Problem: Principal Problem:   Right middle cerebral artery stroke [facility])  Past Medical History:  Past Medical History:  Diagnosis Date   Hyperlipidemia 03/11/2021   Past Surgical History: History reviewed. No pertinent surgical history.  Assessment / Plan / Recommendation Clinical Impression ZAKKIYYA BARNO is a 55 year old right-handed female with history of tobacco use as well as hyperlipidemia and has not seen an MD in many years.  Per chart review patient lives with spouse. Independent prior to admission.  1 level apartment 4 steps to entry.  Presented 03/10/2021 with left-sided weakness x3 days as well as blurred vision.  Family had reported a 45-second episode of shaking which resolved.  Denied loss of consciousness.  Denied any bowel or bladder incontinence.  CT/MRI showed findings consistent with large acute early subacute right posterior MCA territory infarction with exuberant cortical edema, extensive petechial hemorrhage and gyriform enhancement likely related to blood-brain barrier breakdown.  Sulcal/regional mass-effect without midline shift.  CT angiogram of head and neck no large vessel occlusion.  Noncalcified plaque causing less than 50% stenosis at the right ICA origin.  Patient did not receive tPA.  EEG negative for seizure. Admission chemistries unremarkable except WBC of 13,200, potassium 3.3, urine drug screen positive cocaine. Tolerating a regular diet.  Therapy evaluations completed due to patient's decreased functional ability left-sided weakness recommendations of physical medicine rehab consult. She is very pleasant and highly motivated for therapy!   Patient transferred to CIR on 03/17/2021.  Per SLUMS and further informal SLP evaluation, Ms. Paparella presents with overall mild cognitive deficits. Patient scored 15/30 on SLUMS, with score of 27/30 considered to be normal. Deficits were noted in the following areas: - sustained and selective attention - working memory and short-term recall - complex problem solving - emergent and anticipatory awareness - Left visual inattention  SLUMS results:  - Orientation: 3/3 - Word list recall: 0/5 (able to recall with semantic cues) - Mental math: 1/3 - Verbal fluency: 2/3 - Digit reversal/mental manipulation: 1/2 - Clock drawing: 2/4 (incorrect hour and minute hands) - Visuospatial reasoning: 2/2 - Paragraph recall: 4/8  Comprehension deemed WFL per structured evaluation tasks and during complex conversation. Patient did exhibit occasional word finding difficulty at conversational level. Suspect word finding is more attributed to cognitive based impairments (decreased working memory, attention) vs. expressive language impairment. Patient did not exhibit difficulty communicating functional needs and was able to repair pauses in conversation with additional processing time. Patient tolerating regular diet and thin liquids with no complaints or swallowing concerns at this time. Continue current diet as tolerated.   Patient would benefit from skilled SLP services to maximize functional independence prior to discharge.    Skilled Therapeutic Interventions          Administered cognitive-linguistic evaluation using Graf Mental Status Exam (SLUMS). Provided education on results of evaluation, anticipated plan of care, education on memory and attention strategies.      SLP Assessment  Patient will need skilled [facility] during CIR admission    Recommendations  Patient destination: Home Follow up Recommendations: Home Health SLP;24 hour  supervision/assistance Equipment Recommended: None recommended by SLP    SLP Frequency 3 to  5 out of 7 days   SLP Duration  SLP Intensity  SLP Treatment/Interventions 6-7 days  Minumum of 1-2 x/day, 30 to 90 minutes  Cognitive remediation/compensation;Environmental controls;Cueing hierarchy;Functional tasks;Internal/external aids    Pain Pain Assessment Pain Scale: 0-10 Pain Score: 0-No pain   SLP Evaluation Cognition Overall Cognitive Status: Impaired/Different from baseline Arousal/Alertness: Awake/alert Orientation Level: Oriented X4 Attention: Focused;Sustained;Selective;Alternating Focused Attention: Appears intact Sustained Attention: Appears intact Selective Attention: Appears intact Alternating Attention: Impaired Alternating Attention Impairment: Functional basic;Verbal basic Memory: Impaired Memory Impairment: Decreased recall of new information Awareness: Appears intact Awareness Impairment: Emergent impairment;Anticipatory impairment Problem Solving: Impaired Problem Solving Impairment: Verbal complex;Functional basic Executive Function: Self Monitoring;Self Correcting Self Monitoring: Impaired Self Monitoring Impairment: Verbal complex;Functional basic Safety/Judgment: Appears intact  Comprehension Auditory Comprehension Overall Auditory Comprehension: Appears within functional limits for tasks assessed Expression Expression Primary Mode of Expression: Verbal Verbal Expression Overall Verbal Expression: Appears within functional limits for tasks assessed Oral Motor Oral Motor/Sensory Function Overall Oral Motor/Sensory Function: Within functional limits  Care Tool Care Tool Cognition Expression of Ideas and Wants Expression of Ideas and Wants: Some difficulty - exhibits some difficulty with expressing needs and ideas (e.g, some words or finishing thoughts) or speech is not clear   Understanding Verbal and Non-Verbal Content Understanding Verbal and  Non-Verbal Content: Understands (complex and basic) - clear comprehension without cues or repetitions   Memory/Recall Ability *first 3 days only Memory/Recall Ability *first 3 days only: Current season;Location of own room;That he or she is in a hospital/hospital unit    Short Term Goals: Week 1: SLP Short Term Goal 1 (Week 1): Patient will increase recall of functional and novel information with 75% accuracy and Min A cues SLP Short Term Goal 2 (Week 1): Patient will complete mildly complex problem solving tasks with 75% accuracy and Min A cues SLP Short Term Goal 3 (Week 1): Patient will demonstrate selective attention by attending to salient task details during functional tasks with Min A verbal cues.  Refer to Care Plan for Long Term Goals  Recommendations for other services: None   Discharge Criteria: Patient will be discharged from SLP if patient refuses treatment 3 consecutive times without medical reason, if treatment goals not met, if there is a change in medical status, if patient makes no progress towards goals or if patient is discharged from hospital.  The above assessment, treatment plan, treatment alternatives and goals were discussed and mutually agreed upon: by patient  Patty Sermons 03/18/2021, 4:45 PM

## 2021-03-19 NOTE — Progress Notes (Signed)
Speech Language Pathology Daily Session Note  Patient Details  Name: DEBBORA ANG MRN: [id number] Date of Birth: 1966/08/11  Today's Date: 03/19/2021 SLP Individual Time: 1000-1100 SLP Individual Time Calculation (min): 60 min  Short Term Goals: Week 1: SLP Short Term Goal 1 (Week 1): Patient will increase recall of functional and novel information with 75% accuracy and Min A cues SLP Short Term Goal 2 (Week 1): Patient will complete mildly complex problem solving tasks with 75% accuracy and Min A cues SLP Short Term Goal 3 (Week 1): Patient will demonstrate selective attention by attending to salient task details during functional tasks with Min A verbal cues.  Skilled Therapeutic Interventions:   Skilled SLP intervention focused on cognition. Pt completed calendar organization task with min A verbal cues for head turn strategy to see words in left visual field. She required min A for alternating attention to reading scheduled tasks and writing them in on calendar. She expressed concerns with recovery for left visual field cut and discharge date. Educated pt on CVA recovery and plan for discharge. Pt verbalized understanding. Cont with therapy per plan of care.   Pain Pain Assessment Pain Scale: Faces Faces Pain Scale: No hurt  Therapy/Group: Individual Therapy  Carlean Jews Zach Tietje 03/19/2021, 10:54 AM

## 2021-03-19 NOTE — Progress Notes (Signed)
Occupational Therapy Session Note  Patient Details  Name: Brandy Nguyen MRN: [id number] Date of Birth: Jul 18, 1966  Today's Date: 03/19/2021 OT Individual Time: 1100-1200 OT Individual Time Calculation (min): 60 min    Short Term Goals: Week 1:  OT Short Term Goal 1 (Week 1): STGs = LTGs  Skilled Therapeutic Interventions/Progress Updates:    Pt seen this session to focus on L side visual scanning and dynamic balance. Pt received in recliner ready for therapy. Pt ambulate from room to ADL apt, to main gym to dayroom and back to room all with close S to CGA with min cues to fully turn her head to L to scan her environment to compensate for visual loss.  She only had a LOB 1x with min A to recover as she ambulated back to her room. She pointed out a room number on the wall to her L pointing at it with her L hand and kept walking so she was actually rotating around at the torso as she was walking. This caused her to lean too far to the right.  In ADL apt kitchen, pt asked to find items in the cupboards and pantry and was able to do so without any cuing.   In main gym, worked on B arm coordination to attend to L visual field with various reaching activities holding a hula hoop and then a gym ball. Pt ambulated to day room to work on "drumming" activity using drum sticks and a large physioball. Pt had to follow my cues and visual cues as I demonstrated with the sticks and she did very well. Worked on drumming in standing for 15 minutes with good balance with focus on left side awareness.  Pt demonstrate good coordination with slight weakness in L shoulder.  She then ambulated back to room and washed hands at sink, resting in recliner with alarm set and all needs met.    Therapy Documentation Precautions:  Precautions Precautions: Fall Precaution Comments: L hemianopsia Restrictions Weight Bearing Restrictions: No Pain: Pain Assessment Pain Scale: Faces Pain Score: 0-No pain Faces Pain  Scale: No hurt ADL: ADL Eating: Independent Grooming: Supervision/safety Where Assessed-Grooming: Standing at sink Upper Body Bathing: Supervision/safety Where Assessed-Upper Body Bathing: Shower Lower Body Bathing: Supervision/safety Where Assessed-Lower Body Bathing: Shower Upper Body Dressing: Supervision/safety Where Assessed-Upper Body Dressing: Edge of bed Lower Body Dressing: Minimal assistance Where Assessed-Lower Body Dressing: Edge of bed Toileting: Supervision/safety Where Assessed-Toileting: Glass blower/designer: Therapist, music Method: Magazine features editor: Curator Method: Heritage manager: Radio broadcast assistant, Grab bars  Therapy/Group: Individual Therapy  Ianmichael Amescua 03/19/2021, 12:38 PM

## 2021-03-19 NOTE — Progress Notes (Signed)
Occupational Therapy Session Note  Patient Details  Name: MILLER EDGINGTON MRN: [id number] Date of Birth: [birth year]  Today's Date: 03/19/2021 OT Individual Time: 3790-2409 OT Individual Time Calculation (min): 60 min    Short Term Goals: Week 1:  OT Short Term Goal 1 (Week 1): STGs = LTGs  Skilled Therapeutic Interventions/Progress Updates:    Patient in bed, alert and ready for therapy session.   She notes mild HA that is under control at this time.  Supine to sitting edge of bed mod I.  Sit to stand and ambulation in room to/from bed, toilet, shower bench, recliner and w/c without AD CS/CGA - toileting, shower/bathing, dressing, oral care and grooming tasks completed at CS level with cues for safe options to reduce risk of falling and visual scanning.   Completed BITS reaction time activities with focus on visual scanning, standing balance and right/left UE coordination and accuracy.  Trial 1 in stance for 3 minutes using right hand - 96% accurate, reaction time 2.12sec (right side of screen approx 1 second faster than left).    Trial 2 in stance, 3 minutes, using left hand - 76% accurate, reaction time 2.44 sec (similar right vs left timing)        Trial 3 with focus on decreased head turn to left and all stimulation on left side - not timed.   She denies diplopia or difficulty with acuity.   Patient is pleasant, cooperative and demonstrates good understanding t/o session.  Returned to recliner at close of session, seat alarm set and call bell in hand.    Therapy Documentation Precautions:  Precautions Precautions: Fall Precaution Comments: L hemianopsia Restrictions Weight Bearing Restrictions: No   Therapy/Group: Individual Therapy  Barrie Lyme 03/19/2021, 7:42 AM

## 2021-03-19 NOTE — Progress Notes (Signed)
Occupational Therapy Session Note  Patient Details  Name: MAKARA LANZO MRN: [id number] Date of Birth: 1966/08/29  Today's Date: 03/19/2021 OT Individual Time: 5361-4431 OT Individual Time Calculation (min): 30 min    Short Term Goals: Week 1:  OT Short Term Goal 1 (Week 1): STGs = LTGs  Skilled Therapeutic Interventions/Progress Updates:    Pt requesting to use bathroom at beginning of session. Pt reports she has a tub shower at home with no DME prior.  Pt ambulated without AD to bathroom with supervision and toilet transfer using grab bar.  Pt completed pericare and clothing mgt with setup and supervision as well.  Pt transported to ADL suite for time mgt via w/c.  Educated pt on left visual field cut compensatory strategies for safe transfer in and out of tub shower and use of grab bar and shower chair.  Also educated pt on use of portable shower head and antislip mat.  Pt completed tub transfer step over using grab bar with CGA x 2 trials with mod Vcs needed for safe body mechanics.  Pt transported back to room via w/c and pt ambulated around bed and stand to sit at recliner with supervision.  Call bell in reach, seat alarm on.  Therapy Documentation Precautions:  Precautions Precautions: Fall Precaution Comments: L hemianopsia Restrictions Weight Bearing Restrictions: No    Therapy/Group: Individual Therapy  Amie Critchley 03/19/2021, 4:11 PM

## 2021-03-19 NOTE — Progress Notes (Signed)
Patient ID: Brandy Nguyen, female   DOB: 1966-06-14, 55 y.o.   MRN: [id number] Team feels will reach her goals by Tuesday 6/21. MD in agreement with plan for discharge on Tuesday. Pt is aware and will make transportation arrangements.

## 2021-03-19 NOTE — Progress Notes (Signed)
PROGRESS NOTE   Subjective/Complaints: She has an appointment 9am with social security by phone, discussed with therapy and Amil Amen will communicate with scheduling. She has no complaints  ROS: +left visual field cut, denies pain  Objective:   No results found. Recent Labs    03/18/21 0505  WBC 4.6  HGB 14.0  HCT 41.2  PLT 327   Recent Labs    03/18/21 0505  NA 141  K 3.8  CL 105  CO2 27  GLUCOSE 124*  BUN 12  CREATININE 1.11*  CALCIUM 9.5    Intake/Output Summary (Last 24 hours) at 03/19/2021 1047 Last data filed at 03/19/2021 0710 Gross per 24 hour  Intake 476 ml  Output --  Net 476 ml        Physical Exam: Vital Signs Blood pressure 137/84, pulse 60, temperature (!) 97.5 F (36.4 C), temperature source Oral, resp. rate 18, height 4\' 11"  (1.499 m), weight 66.6 kg, SpO2 100 %. Gen: no distress, normal appearing HEENT: oral mucosa pink and moist, NCAT Cardio: Reg rate Chest: normal effort, normal rate of breathing Abd: soft, non-distended Ext: no edema Psych: pleasant, normal affect Skin: intact Neurological:    Comments: Patient is alert.  No acute distress.  Speech is slightly dysarthric but intelligible.  Right gaze preference.  Provides name and age.  Follows simple commands. 5/5 strength throughout. Decreased sensation on left side. Decreased awareness of deficits.      Assessment/Plan: 1. Functional deficits which require 3+ hours per day of interdisciplinary therapy in a comprehensive inpatient rehab setting. Physiatrist is providing close team supervision and 24 hour management of active medical problems listed below. Physiatrist and rehab team continue to assess barriers to discharge/monitor patient progress toward functional and medical goals  Care Tool:  Bathing        Body parts bathed by helper: Right arm, Left arm, Chest, Abdomen, Front perineal area, Buttocks, Right upper leg,  Left upper leg, Right lower leg, Left lower leg, Face     Bathing assist Assist Level: Supervision/Verbal cueing     Upper Body Dressing/Undressing Upper body dressing   What is the patient wearing?: Pull over shirt    Upper body assist Assist Level: Supervision/Verbal cueing    Lower Body Dressing/Undressing Lower body dressing      What is the patient wearing?: Pants, Underwear/pull up     Lower body assist Assist for lower body dressing: Supervision/Verbal cueing     Toileting Toileting    Toileting assist Assist for toileting: Supervision/Verbal cueing     Transfers Chair/bed transfer  Transfers assist     Chair/bed transfer assist level: Contact Guard/Touching assist     Locomotion Ambulation   Ambulation assist      Assist level: Contact Guard/Touching assist Assistive device: No Device Max distance: 33ft   Walk 10 feet activity   Assist     Assist level: Contact Guard/Touching assist Assistive device: No Device   Walk 50 feet activity   Assist    Assist level: Contact Guard/Touching assist Assistive device: No Device    Walk 150 feet activity   Assist    Assist level: Contact Guard/Touching assist Assistive device:  No Device    Walk 10 feet on uneven surface  activity   Assist     Assist level: Contact Guard/Touching assist     Wheelchair     Assist Will patient use wheelchair at discharge?: No   Wheelchair activity did not occur: N/A         Wheelchair 50 feet with 2 turns activity    Assist    Wheelchair 50 feet with 2 turns activity did not occur: N/A       Wheelchair 150 feet activity     Assist  Wheelchair 150 feet activity did not occur: N/A       Blood pressure 137/84, pulse 60, temperature (!) 97.5 F (36.4 C), temperature source Oral, resp. rate 18, height 4\' 11"  (1.499 m), weight 66.6 kg, SpO2 100 %.  Medical Problem List and Plan: 1.  Left-sided weakness and dysarthria  secondary to right MCA infarction with petechial hemorrhage and cortical edema.  Plan 30-day cardiac event monitor             -patient may shower             -ELOS/Goals: 5-7 days S to modI             Continue CIR 2.  Impaired mobility: d/c Lovenox since ambulating 322feet.              -antiplatelet therapy: Continue Aspirin 81 mg daily 3. Pain Management: Tylenol as needed 4. Mood: Provide emotional support             -antipsychotic agents: N/A 5. Neuropsych: This patient is capable of making decisions on her own behalf. 6. Skin/Wound Care: Routine skin checks 7. Fluids/Electrolytes/Nutrition: Routine in and out with follow-up chemistries 8.  Seizure prophylaxis.  Discontinue Keppra since one week seizure free. EEG negative 9.  Tobacco/polysubstance abuse.  Urine drug screen positive cocaine.  Provide counseling 10.  Hyperlipidemia. Continue Lipitor 11. AKI: placed nursing order for 6-8 glasses of water per day, discussed with nursing and patient.  12. Overweight BMI 29.66: provide dietary education.  13. Left visual field cut: Continue compensatory strategies.  14. Disposition: messaged Lisa to schedule f/u with me. Patient needs f/u scheduled with new PCP as well.     LOS: 2 days A FACE TO FACE EVALUATION WAS PERFORMED  120f Eddrick Dilone 03/19/2021, 10:47 AM

## 2021-03-20 NOTE — Progress Notes (Signed)
Occupational Therapy Session Note  Patient Details  Name: TEKA CHANDA MRN: [id number] Date of Birth: June 02, 1966  Today's Date: 03/20/2021 OT Individual Time: 1400-1500 OT Individual Time Calculation (min): 60 min    Short Term Goals: Week 1:  OT Short Term Goal 1 (Week 1): STGs = LTGs  Skilled Therapeutic Interventions/Progress Updates:    Pt received in room in recliner and consented to OT tx. Pt seen for instruction and trianing in functional mobility, tub bench transfers, BITs activities to increase visual scanning to compensate for L visual cut deficits, and cognition activities this day. Pt completed bell cancellation test with min cuing to scan to see entire tv screen. Instructed in sequencing, trail making, and maze activities req min cuing for proper completion. After BITs board, pt walked with CGA and no device to day room and instructed in 24 piece jumbo jigsaw puzzle activity. Pt required max cuing for puzzle completion and tried to fit pieces together that were opposite/ different colors. With increased time, pt demo'd some problem solving and spatial awareness deficits, but was unable to complete puzzle without extensive help. Pt completed all activities in standing to increase activity tolerance for ADLs. After tx, pt walked with CGA back to room and left up in recliner with all needs met, chair alarm on.  Therapy Documentation Precautions:  Precautions Precautions: Fall Precaution Comments: L hemianopsia Restrictions Weight Bearing Restrictions: No  Vital Signs: Therapy Vitals Temp: 98.8 F (37.1 C) Temp Source: Oral Pulse Rate: (!) 58 Resp: 17 BP: 124/76 Patient Position (if appropriate): Sitting Oxygen Therapy SpO2: 100 % O2 Device: Room Air Pain: none     Therapy/Group: Individual Therapy  Miriah Maruyama 03/20/2021, 2:42 PM

## 2021-03-20 NOTE — Progress Notes (Signed)
Physical Therapy Session Note  Patient Details  Name: Brandy Nguyen MRN: [id number] Date of Birth: 1966-04-11  Today's Date: 03/20/2021 PT Individual Time: 1330-1400 PT Individual Time Calculation (min): 30 min   Short Term Goals: Week 1:  PT Short Term Goal 1 (Week 1): STG = LTG due to ELOS  Skilled Therapeutic Interventions/Progress Updates:    Patient in recliner on the phone with family.  Reports feeling okay.  Performed sit to stand with S.  Ambulated with CGA to S no assistive device to dayroom x 120'.  Patient negotiated obstacle course stepping around cones and up on 4" step with cues for sequence with S/CGA.  Patient performed dynamic gait tasks tapping cones with foot then stepping through, side stepping and tapping cones, tossing ball to self while ambulating about 45' with CGA to S, bounce and catch ball with ambulation over 120' with CGA/S.  Then performed tandem gait forward and back with CGA.  Patient ambulated to room able to find room with only gesturing cues.  Patient left in recliner with chair alarm on and call bell in reach.  She was able to discuss her deficits and reports she is doing better with both vision and balance.    Therapy Documentation Precautions:  Precautions Precautions: Fall Precaution Comments: L hemianopsia Restrictions Weight Bearing Restrictions: No Pain: Pain Assessment Pain Score: 0-No pain    Therapy/Group: Individual Therapy  Elray Mcgregor [place], PT 03/20/2021, 5:18 PM

## 2021-03-20 NOTE — Progress Notes (Signed)
Physical Therapy Session Note  Patient Details  Name: Brandy Nguyen MRN: [id number] Date of Birth: 08/05/1966  Today's Date: 03/20/2021 PT Individual Time: 1000-1055 PT Individual Time Calculation (min): 55 min   Short Term Goals: Week 1:  PT Short Term Goal 1 (Week 1): STG = LTG due to ELOS  Skilled Therapeutic Interventions/Progress Updates:    Pt greeted seated in recliner to start session. Agreeable to PT tx without reports of pain. Focus of today's session to work on long distance gait training with community reintegration outdoors and in the hospital. She donned slip-on shoes while seated without assist. Sit<>stand with supervision from recliner with no AD. She ambulated with supervision and no AD from her room to downstairs (via elevator) to the [facility] where she practiced outdoor ambulation. While outdoors, worked on sitting/standing from various surface heights with vs without arm rests, such as park benches, elevated seats, etc - all with supervision and no AD. While outdoors, she also navigated several steps with CGA and 1 hand rail support as well as practiced stepping over curbs and ambulating in the grass - all with CGA. She did demonstrate good safety awareness of scanning to the L for cars in the parking lot without cues needed. Also practiced ambulating in busy hallways of the hospital, requiring supervision with cues for scanning L to avoid obstacles and moving objects. No instances of knee buckling or LOB throughout long distance gait. A few short seated rest breaks provided throughout as needed but no shortness of breath or dyspnea noted. She completed session by ambulating back to her room with supervision, remaining seated in recliner at end of session with chair alarm on. All needs met.   Therapy Documentation Precautions:  Precautions Precautions: Fall Precaution Comments: L hemianopsia Restrictions Weight Bearing Restrictions: No General:    Therapy/Group:  Individual Therapy  Payson Crumby P Isaiyah Feldhaus PT 03/20/2021, 7:33 AM

## 2021-03-20 NOTE — Progress Notes (Signed)
Occupational Therapy Session Note  Patient Details  Name: Brandy Nguyen MRN: [id number] Date of Birth: May 19, 1966  Today's Date: 03/20/2021 OT Individual Time: 3817-7116 OT Individual Time Calculation (min): 55 min    Short Term Goals: No short term goals set  Skilled Therapeutic Interventions/Progress Updates:    Pt and SW report that pt's sister has a tub bench pt can use at home.  Pt received in bed and ready for a shower. Ambulated to bathroom with close S but no cues needed to toilet, get undressed and get in the shower. She showered and then sat at EOB to dress with set up only.  No cues needed with dressing.  Reviewed with pt what caused her LOB yesterday when she looked back to her L.  Discussed ways to prevent that by scanning forward and then stopping and literally turning her body at a 90 degree angle to find things on her left.    Had pt practice this with 3 laps around the nursing station to day room with having pt focus on faster gait pattern with increased arm swing. I called out various things for pt to find on her left, if she did not see them before passing them she had to stop and turn around to find the item. She will need practice with this to avoid getting injured in public spaces. Within the space of her room, she has improved with visual scanning and is also now more familiar with her environment.   His sister can come over to her home after work hours, but pt will be alone and will need to be more proficient with her scanning for improved safety.    Pt does seem to have some delayed processing and memory with complex issues. For example, she has asked several times if home health will come to her house.  Pt has been told that she will not have further therapy at this time due to no insurance.  She is applying to Medicaid.    Pt has expressed desire to go home this weekend, but I did explain a few more days to get accustomed to the significant visual loss would be  beneficial.   Pt resting in recliner with chair alarm on and all needs met.  Therapy Documentation Precautions:  Precautions Precautions: Fall Precaution Comments: L hemianopsia Restrictions Weight Bearing Restrictions: No  Pain: Pain Assessment Pain Scale: 0-10 Pain Score: 0-No pain ADL: ADL Eating: Independent Grooming: Supervision/safety Where Assessed-Grooming: Standing at sink Upper Body Bathing: Setup Where Assessed-Upper Body Bathing: Shower Lower Body Bathing: Setup Where Assessed-Lower Body Bathing: Shower Upper Body Dressing: Setup Where Assessed-Upper Body Dressing: Edge of bed Lower Body Dressing: Setup Where Assessed-Lower Body Dressing: Edge of bed Toileting: Modified independent Where Assessed-Toileting: Glass blower/designer: Close supervision Armed forces technical officer Method: Magazine features editor: Close supervision Social research officer, government Method: Heritage manager: Radio broadcast assistant, Grab bars   Therapy/Group: Individual Therapy  [place] 03/20/2021, 10:13 AM

## 2021-03-20 NOTE — Progress Notes (Signed)
PROGRESS NOTE   Subjective/Complaints: Doing great with therapy but still some issues with balance related to her field cut. Discussed plan for d/c Tuesday. Patient was not driving prior to admission- takes bus, reinforced she will not be able to drive.   ROS: +left visual field cut, denies pain, constipation.   Objective:   No results found. Recent Labs    03/18/21 0505  WBC 4.6  HGB 14.0  HCT 41.2  PLT 327   Recent Labs    03/18/21 0505  NA 141  K 3.8  CL 105  CO2 27  GLUCOSE 124*  BUN 12  CREATININE 1.11*  CALCIUM 9.5    Intake/Output Summary (Last 24 hours) at 03/20/2021 1044 Last data filed at 03/20/2021 0853 Gross per 24 hour  Intake 377 ml  Output --  Net 377 ml        Physical Exam: Vital Signs Blood pressure 120/83, pulse (!) 59, temperature 97.8 F (36.6 C), resp. rate 16, height 4\' 11"  (1.499 m), weight 66.6 kg, SpO2 100 %. Gen: no distress, normal appearing HEENT: oral mucosa pink and moist, NCAT Cardio: Bradycardia Chest: normal effort, normal rate of breathing Abd: soft, non-distended Ext: no edema Psych: pleasant, normal affect Skin: intact Neurological:    Comments: Patient is alert.  No acute distress.  Speech is slightly dysarthric but intelligible.  Right gaze preference.  Provides name and age.  Follows simple commands. 5/5 strength throughout. Decreased sensation on left side. Decreased awareness of deficits.      Assessment/Plan: 1. Functional deficits which require 3+ hours per day of interdisciplinary therapy in a comprehensive inpatient rehab setting. Physiatrist is providing close team supervision and 24 hour management of active medical problems listed below. Physiatrist and rehab team continue to assess barriers to discharge/monitor patient progress toward functional and medical goals  Care Tool:  Bathing    Body parts bathed by patient: Right arm, Left arm, Chest,  Abdomen, Front perineal area, Buttocks, Right upper leg, Left upper leg, Right lower leg, Left lower leg, Face   Body parts bathed by helper: Right arm, Left arm, Chest, Abdomen, Front perineal area, Buttocks, Right upper leg, Left upper leg, Right lower leg, Left lower leg, Face     Bathing assist Assist Level: Supervision/Verbal cueing     Upper Body Dressing/Undressing Upper body dressing   What is the patient wearing?: Pull over shirt    Upper body assist Assist Level: Supervision/Verbal cueing    Lower Body Dressing/Undressing Lower body dressing      What is the patient wearing?: Underwear/pull up     Lower body assist Assist for lower body dressing: Supervision/Verbal cueing     Toileting Toileting    Toileting assist Assist for toileting: Supervision/Verbal cueing     Transfers Chair/bed transfer  Transfers assist     Chair/bed transfer assist level: Contact Guard/Touching assist     Locomotion Ambulation   Ambulation assist      Assist level: Contact Guard/Touching assist Assistive device: No Device Max distance: 313ft   Walk 10 feet activity   Assist     Assist level: Contact Guard/Touching assist Assistive device: No Device   Walk  50 feet activity   Assist    Assist level: Contact Guard/Touching assist Assistive device: No Device    Walk 150 feet activity   Assist    Assist level: Contact Guard/Touching assist Assistive device: No Device    Walk 10 feet on uneven surface  activity   Assist     Assist level: Contact Guard/Touching assist     Wheelchair     Assist Will patient use wheelchair at discharge?: No   Wheelchair activity did not occur: N/A         Wheelchair 50 feet with 2 turns activity    Assist    Wheelchair 50 feet with 2 turns activity did not occur: N/A       Wheelchair 150 feet activity     Assist  Wheelchair 150 feet activity did not occur: N/A       Blood pressure  120/83, pulse (!) 59, temperature 97.8 F (36.6 C), resp. rate 16, height 4\' 11"  (1.499 m), weight 66.6 kg, SpO2 100 %.  Medical Problem List and Plan: 1.  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  Plan 30-day cardiac event monitor             -patient may shower             -ELOS/Goals: 5-7 days S to modI             Continue CIR 2.  Impaired mobility: d/c Lovenox since ambulating 387feet.              -antiplatelet therapy: Continue Aspirin 81 mg daily 3. Pain Management: N/A. Tylenol as needed 4. Mood: Provide emotional support             -antipsychotic agents: N/A 5. Neuropsych: This patient is capable of making decisions on her own behalf. 6. Skin/Wound Care: Routine skin checks 7. Fluids/Electrolytes/Nutrition: Routine in and out with follow-up chemistries 8.  Seizure prophylaxis.  Discontinue Keppra since one week seizure free. EEG negative 9.  Tobacco/polysubstance abuse.  Urine drug screen positive cocaine.  Provide counseling 10.  Hyperlipidemia. Continue Lipitor 11. AKI: placed nursing order for 6-8 glasses of water per day, discussed with nursing and patient.  12. Overweight BMI 29.66: provide dietary education.  13. Left visual field cut: Continue compensatory strategies. Discussed that she should not drive at home- primarily uses bus for transport. 14. Disposition: messaged Lisa to schedule f/u with me. Patient needs f/u scheduled with new PCP as well.     LOS: 3 days A FACE TO FACE EVALUATION WAS PERFORMED  120f Lorelei Heikkila 03/20/2021, 10:44 AM

## 2021-03-20 NOTE — Progress Notes (Signed)
Patient ID: Brandy Nguyen, female   DOB: 11-03-1965, 55 y.o.   MRN: [id number] Match put in for pt in preparation of discharge next Tuesday 6/21

## 2021-03-20 NOTE — IPOC Note (Signed)
Overall Plan of Care [facility]) Patient Details Name: EDY MCBANE MRN: [id number] DOB: 1965-11-12  Admitting Diagnosis: Right middle cerebral artery stroke [facility])  Hospital Problems: Principal Problem:   Right middle cerebral artery stroke [facility])     Functional Problem List: Nursing Bowel, Safety, Medication Management, Endurance, Pain  PT Balance, Endurance, Motor, Pain  OT Balance, Endurance, Motor, Vision, Safety, Perception  SLP Cognition, Other (comment) (vision)  TR         Basic ADL's: OT Bathing, Dressing, Toileting     Advanced  ADL's: OT Light Housekeeping     Transfers: PT Bed Mobility, Bed to Chair, Customer service manager, Tub/Shower     Locomotion: PT Ambulation, Stairs     Additional Impairments: OT None  SLP None      TR      Anticipated Outcomes Item Anticipated Outcome  Self Feeding independent  Swallowing      Basic self-care  Mod I  Toileting  Mod I   Bathroom Transfers Mod I  Bowel/Bladder  manage bowel with mod I assist  Transfers  mod I with LRAD  Locomotion  mod I with LRAD  Communication     Cognition  Supervision  Pain  pain at or below level 4  Safety/Judgment  maintain safety with cues/reminders   Therapy Plan: PT Intensity: Minimum of 1-2 x/day ,45 to 90 minutes PT Frequency: 5 out of 7 days PT Duration Estimated Length of Stay: 5-7 days OT Intensity: Minimum of 1-2 x/day, 45 to 90 minutes OT Frequency: 5 out of 7 days OT Duration/Estimated Length of Stay: 6-7 days SLP Intensity: Minumum of 1-2 x/day, 30 to 90 minutes SLP Frequency: 3 to 5 out of 7 days SLP Duration/Estimated Length of Stay: 6-7 days   Due to the current state of emergency, patients may not be receiving their 3-hours of Medicare-mandated therapy.   Team Interventions: Nursing Interventions Patient/Family Education, Bowel Management, Pain Management, Medication Management, Disease Management/Prevention, Discharge Planning  PT interventions  Ambulation/gait training, Discharge planning, Functional mobility training, Psychosocial support, Therapeutic Activities, Visual/perceptual remediation/compensation, Wheelchair propulsion/positioning, Therapeutic Exercise, Skin care/wound management, Neuromuscular re-education, Disease management/prevention, Warden/ranger, Cognitive remediation/compensation, DME/adaptive equipment instruction, Pain management, Splinting/orthotics, UE/LE Strength taining/ROM, UE/LE Coordination activities, Stair training, Patient/family education, Functional electrical stimulation, Community reintegration  OT Interventions Warden/ranger, Discharge planning, Functional mobility training, Neuromuscular re-education, Patient/family education, Psychosocial support, Self Care/advanced ADL retraining, Therapeutic Activities, Therapeutic Exercise, UE/LE Strength taining/ROM, Visual/perceptual remediation/compensation  SLP Interventions Cognitive remediation/compensation, Environmental controls, Cueing hierarchy, Functional tasks, Internal/external aids  TR Interventions    SW/CM Interventions Discharge Planning, Psychosocial Support, Patient/Family Education   Barriers to Discharge MD  Medical stability  Nursing Decreased caregiver support, Home environment access/layout 1 level atp 4 ste with left railing, spouse  PT Lack of/limited family support, Insurance for SNF coverage, Decreased caregiver support L visual field cut  OT      SLP      SW Lack of/limited family support, Community education officer for SNF coverage, Medication compliance, Other (comments) (positve for drugs when admitted)     Team Discharge Planning: Destination: PT-Home ,OT- Home , SLP-Home Projected Follow-up: PT-Outpatient PT, OT-  Home health OT, SLP-Home Health SLP, 24 hour supervision/assistance Projected Equipment Needs: PT-To be determined, OT- Tub/shower seat, SLP-None recommended by SLP Equipment Details: PT-Pt owns no DME, OT-   Patient/family involved in discharge planning: PT- Patient,  OT-Patient, SLP-Patient  MD ELOS: 7 days Medical Rehab Prognosis:  Excellent Assessment: Mrs. 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  Plan 30-day cardiac event monitor. Medications have been adjusted, and labs and vitals monitored regularly.     See Team Conference Notes for weekly updates to the plan of care

## 2021-03-21 NOTE — Progress Notes (Signed)
Physical Therapy Session Note  Patient Details  Name: Brandy Nguyen MRN: [id number] Date of Birth: 08/05/1966  Today's Date: 03/21/2021 PT Individual Time: 1104-1201 PT Individual Time Calculation (min): 57 min   Short Term Goals: Week 1:  PT Short Term Goal 1 (Week 1): STG = LTG due to ELOS   Skilled Therapeutic Interventions/Progress Updates:   Pt received sitting in recliner and agreeable to PT. Pt reports need to urinate. Ambulatory transfer to bathroom with CGA with cues for visual scanning ot the L. Supervision assist from PT for safety with clothing management and hand hygiene at sink.   Gait training through hall x 167f, 1457fand 15095fith supervision assist, no AD, and cues for awareness of obstacles on the L initially, but able to demonstrate improved visual scanning throughout session.   Dynamic gait training with use of agility ladder 1 foot in each space x 4 with CGA. Side stepping R and L x 6 each with cues for weight shifting and visual scanning to the L to locate each space and cues for full step length to allow BLE into each space.   Stair management training to ascend/descend 6inch step x 12 with intermittent step-to/step through gait pattern without UE support and CGA-supervision assist from PT. Pt then performed stair management on 8" curb step x 6 with CGA-supervision assist for safety leading with BLE intermittently.   Pt performed path finding/visual scanning task to locate numbered targets in hall in ascending and then descending order: 1-12 cues for visual scanning to the L intermittently.   Patient returned to room and performed ambulatory to recliner with supervision assist and no AD. Pt left sitting in recliner with call bell in reach and all needs met.         Therapy Documentation Precautions:  Precautions Precautions: Fall Precaution Comments: L hemianopsia Restrictions Weight Bearing Restrictions: No   Pain: Pain Assessment Pain Scale:  0-10 Pain Score: 0-No pain    Therapy/Group: Individual Therapy  AusLorie Phenix18/2022, 12:02 PM

## 2021-03-21 NOTE — Progress Notes (Addendum)
Occupational Therapy Session Note  Patient Details  Name: Brandy Nguyen MRN: [id number] Date of Birth: [birth year]  Today's Date: 03/21/2021 OT Individual Time: 0922-1000; 1345-1430 OT Individual Time Calculation (min): 38 min ; 45 min   Short Term Goals: Week 1:  OT Short Term Goal 1 (Week 1): STGs = LTGs  Skilled Therapeutic Interventions/Progress Updates:    First session:  Pt semi upright in bed, no c/o pain, requesting to shower.  Pt completed all functional mobility with supervision needing occasional cues to attend to left side.  Supine to sit EOB, sit to stand and ambulation without AD around bed to bathroom.  Toilet transfer using grab bar for balance and then ambulated a few steps to walk in shower.  Stand<>sit at shower bench to complete all bathing and dressing with use of grab bar.  Pt required setup for UB/LB bathing and UB dressing and supervision for LB dressing to facilitate safe technique in sitting versus standing.  Pt ambulated from bathroom to recliner and completed stand to sit without AD.  No LOB noted.  Call bell in reach, seat alarm on.  Second session:  Pt reports she would like to go outside to get some fresh air.  Pt retrieved house slippers in preperation to donn which were located under bed.  Pt was able to complete controlled transfer into quadraped position on floor with supervision and return to standing using BUE on bed for support.  Pt ambulated from room to elevator and then out to [facility] on first floor with supervision needing occasional cues to attend to left side (including when elevator door opened on left side of pt, needing cue to enter due to doors opened and closed without pt being aware and needing to press elevator button second time).  Pt had seated rest break while discussing dc planning including reports she will be using borrowed shower bench from sister to bathe safely and also discussed safety strategies while in kitchen during meal  preperation.  Pt verbalized good safety awareness throughout discussion.  Pt ambulated back to room with supervision and improved left visual scanning without needing cues.  Pt completed toileting and toilet transfer with distant supervision.  Returned to bed with mod I.  Call bell in reach, bed alarm on.   Therapy Documentation Precautions:  Precautions Precautions: Fall Precaution Comments: L hemianopsia Restrictions Weight Bearing Restrictions: No    Therapy/Group: Individual Therapy  Amie Critchley 03/21/2021, 4:03 PM

## 2021-03-21 NOTE — Progress Notes (Signed)
PROGRESS NOTE   Subjective/Complaints: Patient without new complaints today.  ROS: +left visual field cut, denies pain, constipation.   Objective:   No results found. No results for input(s): WBC, HGB, HCT, PLT in the last 72 hours.  No results for input(s): NA, K, CL, CO2, GLUCOSE, BUN, CREATININE, CALCIUM in the last 72 hours.   Intake/Output Summary (Last 24 hours) at 03/21/2021 1443 Last data filed at 03/21/2021 1329 Gross per 24 hour  Intake 613 ml  Output 1 ml  Net 612 ml         Physical Exam: Vital Signs Blood pressure 122/84, pulse (!) 58, temperature 97.8 F (36.6 C), resp. rate 17, height 4\' 11"  (1.499 m), weight 66.6 kg, SpO2 100 %.  General: No acute distress Mood and affect are appropriate Heart: Regular rate and rhythm no rubs murmurs or extra sounds Lungs: Clear to auscultation, breathing unlabored, no rales or wheezes Abdomen: Positive bowel sounds, soft nontender to palpation, nondistended Extremities: No clubbing, cyanosis, or edema Skin: No evidence of breakdown, no evidence of rash   Skin: intact Neurological:    Comments: Patient is alert.  No acute distress.  Speech is slightly dysarthric but intelligible.  Right gaze preference.  Provides name and age.  Follows simple commands. 5/5 strength throughout. Decreased sensation on left side. Decreased awareness of deficits.      Assessment/Plan: 1. Functional deficits which require 3+ hours per day of interdisciplinary therapy in a comprehensive inpatient rehab setting. Physiatrist is providing close team supervision and 24 hour management of active medical problems listed below. Physiatrist and rehab team continue to assess barriers to discharge/monitor patient progress toward functional and medical goals  Care Tool:  Bathing    Body parts bathed by patient: Right arm, Left arm, Chest, Abdomen, Front perineal area, Buttocks, Right upper  leg, Left upper leg, Right lower leg, Left lower leg, Face   Body parts bathed by helper: Right arm, Left arm, Chest, Abdomen, Front perineal area, Buttocks, Right upper leg, Left upper leg, Right lower leg, Left lower leg, Face     Bathing assist Assist Level: Supervision/Verbal cueing     Upper Body Dressing/Undressing Upper body dressing   What is the patient wearing?: Pull over shirt    Upper body assist Assist Level: Supervision/Verbal cueing    Lower Body Dressing/Undressing Lower body dressing      What is the patient wearing?: Underwear/pull up     Lower body assist Assist for lower body dressing: Supervision/Verbal cueing     Toileting Toileting    Toileting assist Assist for toileting: Supervision/Verbal cueing     Transfers Chair/bed transfer  Transfers assist     Chair/bed transfer assist level: Contact Guard/Touching assist     Locomotion Ambulation   Ambulation assist      Assist level: Contact Guard/Touching assist Assistive device: No Device Max distance: 337ft   Walk 10 feet activity   Assist     Assist level: Contact Guard/Touching assist Assistive device: No Device   Walk 50 feet activity   Assist    Assist level: Contact Guard/Touching assist Assistive device: No Device    Walk 150 feet activity  Assist    Assist level: Contact Guard/Touching assist Assistive device: No Device    Walk 10 feet on uneven surface  activity   Assist     Assist level: Contact Guard/Touching assist     Wheelchair     Assist Will patient use wheelchair at discharge?: No   Wheelchair activity did not occur: N/A         Wheelchair 50 feet with 2 turns activity    Assist    Wheelchair 50 feet with 2 turns activity did not occur: N/A       Wheelchair 150 feet activity     Assist  Wheelchair 150 feet activity did not occur: N/A       Blood pressure 122/84, pulse (!) 58, temperature 97.8 F (36.6 C),  resp. rate 17, height 4\' 11"  (1.499 m), weight 66.6 kg, SpO2 100 %.  Medical Problem List and Plan: 1.  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  Plan 30-day cardiac event monitor             -patient may shower             -ELOS/Goals: 5-7 days S to modI             Continue CIR 2.  Impaired mobility: d/c Lovenox since ambulating 356feet.              -antiplatelet therapy: Continue Aspirin 81 mg daily 3. Pain Management: N/A. Tylenol as needed 4. Mood: Provide emotional support             -antipsychotic agents: N/A 5. Neuropsych: This patient is capable of making decisions on her own behalf. 6. Skin/Wound Care: Routine skin checks 7. Fluids/Electrolytes/Nutrition: Routine in and out with follow-up chemistries 8.  Seizure prophylaxis.  Discontinue Keppra since one week seizure free. EEG negative 9.  Tobacco/polysubstance abuse.  Urine drug screen positive cocaine.  Provide counseling 10.  Hyperlipidemia. Continue Lipitor 11. AKI: placed nursing order for 6-8 glasses of water per day, discussed with nursing and patient.  12. Overweight BMI 29.66: provide dietary education.  13. Left visual field cut: Continue compensatory strategies. Discussed that she should not drive at home- primarily uses bus for transport. 14. Disposition: messaged Lisa to schedule f/u with me. Patient needs f/u scheduled with new PCP as well.     LOS: 4 days A FACE TO FACE EVALUATION WAS PERFORMED  120f 03/21/2021, 2:43 PM

## 2021-03-21 NOTE — Progress Notes (Signed)
Physical Therapy Session Note  Patient Details  Name: Brandy Nguyen MRN: [id number] Date of Birth: 30-Apr-1966  Today's Date: 03/21/2021 PT Individual Time: 2633-3545 PT Individual Time Calculation (min): 25 min   Short Term Goals: Week 1:  PT Short Term Goal 1 (Week 1): STG = LTG due to ELOS  Skilled Therapeutic Interventions/Progress Updates:    Pt received supine in bed and agreeable to PT. Supine>sit transfer with supervision assist and cues for awareness of EOB on the L side. Pt transported to bathroom. Able to urinate with supervision assist for balance with clothing management. Pt transported to atrium. Gait training through atrium with supervision assist and min cues for direction and visual scanning to the L to avoid obstacles 341f +[id number] Gait training also performed on unlevel cement sidewalk without AD and supervision assist from PT for safety x [id number]. Patient returned to room and performed stand pivot to recliner with supervision assist and no AD. Pt left sitting in recliner with call bell in reach and all needs met.      Therapy Documentation Precautions:  Precautions Precautions: Fall Precaution Comments: L hemianopsia Restrictions Weight Bearing Restrictions: No   Pain:   denies   Therapy/Group: Individual Therapy  AusLorie Phenix18/2022, 5:34 PM

## 2021-03-22 NOTE — Discharge Summary (Signed)
Physician Discharge Summary  Patient ID: Brandy Nguyen MRN: [id number] DOB/AGE: [birth year] 55 y.o.  Admit date: 03/17/2021 Discharge date: 03/24/2021  Discharge Diagnoses:  Principal Problem:   Right middle cerebral artery stroke [facility]) Seizure prophylaxis Tobacco/polysubstance abuse Hyperlipidemia AKI Obesity  Discharged Condition: Stable  Significant Diagnostic Studies: CT Angio Head W or Wo Contrast  Result Date: 03/10/2021 CLINICAL DATA:  Right MCA stroke EXAM: CT ANGIOGRAPHY HEAD AND NECK TECHNIQUE: Multidetector CT imaging of the head and neck was performed using the standard protocol during bolus administration of intravenous contrast. Multiplanar CT image reconstructions and MIPs were obtained to evaluate the vascular anatomy. Carotid stenosis measurements (when applicable) are obtained utilizing NASCET criteria, using the distal internal carotid diameter as the denominator. CONTRAST:  76mL OMNIPAQUE IOHEXOL 350 MG/ML SOLN COMPARISON:  Correlation made with same day prior imaging FINDINGS: CTA NECK Aortic arch: Great vessel origins are patent. Right carotid system: Patent. There is eccentric noncalcified plaque along the proximal internal carotid causing less than 50% stenosis. Left carotid system: Patent.  No stenosis. Vertebral arteries: Patent. Codominant. There is early extracranial origin of bilateral PICAs at C1-C2. Skeleton: Degenerative changes of the cervical spine. There is least moderate canal stenosis. Multilevel foraminal narrowing. Other neck: Unremarkable. Upper chest: Included upper lungs are clear. Review of the MIP images confirms the above findings CTA HEAD Anterior circulation: Intracranial internal carotid arteries are patent anterior cerebral arteries are patent. 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 Middle cerebral arteries are patent. Posterior circulation: Intracranial vertebral arteries are patent. Basilar artery is patent. Superior  cerebellar artery origins are patent. Posterior cerebral arteries are patent. Bilateral posterior communicating arteries are present with essentially fetal origin of the PCAs. Venous sinuses: Patent as allowed by contrast bolus timing. Review of the MIP images confirms the above findings IMPRESSION: No large vessel occlusion. Noncalcified plaque causing less than 50% stenosis at the right ICA origin. Electronically Signed   By: Guadlupe Spanish M.D.   On: 03/10/2021 17:17   CT HEAD WO CONTRAST  Result Date: 03/11/2021 CLINICAL DATA:  Follow-up examination for acute stroke. EXAM: CT HEAD WITHOUT CONTRAST TECHNIQUE: Contiguous axial images were obtained from the base of the skull through the vertex without intravenous contrast. COMPARISON:  Prior CT from 03/10/2021. FINDINGS: Brain: Continued interval evolution of moderate-sized right MCA distribution infarct, relatively stable in size and distribution from previous. Cytotoxic edema is slightly more well-defined as compared to previous. Associated regional mass effect with trace 2 mm right-to-left shift is not significantly changed. Scattered petechial hemorrhage within the area of infarction is relatively stable as well. No frank hemorrhagic transformation or intraparenchymal hematoma. No other new large vessel territory infarct. No other acute or interval hemorrhage. No mass lesion or extra-axial collection. No hydrocephalus or ventricular trapping. Basilar cisterns remain patent. Vascular: No hyperdense vessel. Skull: Scalp soft tissues and calvarium are stable. Sinuses/Orbits: Globes and orbital soft tissues demonstrate no acute finding. Scattered mucosal thickening noted within the ethmoidal air cells and maxillary sinuses. Paranasal sinuses are otherwise clear. Mastoid air cells remain clear. Other: None. IMPRESSION: 1. Continued interval evolution of moderate-sized right MCA distribution infarct, relatively stable in size and distribution from previous.  Associated regional mass effect with trace 2 mm right-to-left shift, not significantly changed. Similar associated petechial hemorrhage without frank hemorrhagic transformation. 2. No other new acute intracranial abnormality. Electronically Signed   By: Rise Mu M.D.   On: 03/11/2021 04:09   CT HEAD WO CONTRAST  Result Date: 03/10/2021  CLINICAL DATA:  Follow-up examination for stroke EXAM: CT HEAD WITHOUT CONTRAST TECHNIQUE: Contiguous axial images were obtained from the base of the skull through the vertex without intravenous contrast. COMPARISON:  Prior studies from earlier the same day. FINDINGS: Brain: Probable vaulting acute to subacute right MCA distribution infarct again seen, overall stable in size and distribution from prior. Associated areas of superimposed petechial hemorrhage grossly similar, better appreciated on prior MRI. A degree of suspected associated contrast staining related to prior CTA present as well. No frank intraparenchymal hematoma or hemorrhagic transformation. Associated localized edema with regional mass effect. Associated trace 2 mm right-to-left shift, stable. No hydrocephalus or trapping. Basilar cisterns remain patent. No other new acute large vessel territory infarct or hemorrhage. No other mass lesion. No extra-axial fluid collection. Vascular: No hyperdense vessel. Skull: Scalp soft tissues and calvarium demonstrate no new abnormality. Sinuses/Orbits: Globes and orbital soft tissues within normal limits. Paranasal sinuses remain largely clear. No mastoid effusion. Other: None. IMPRESSION: 1. No significant interval change in size and distribution of acute to subacute right MCA distribution infarct, with similar associated areas of petechial hemorrhage. No frank intraparenchymal hematoma or hemorrhagic transformation. Associated localized edema with regional mass effect with trace 2 mm right-to-left shift, stable. No hydrocephalus or trapping. 2. No other new acute  intracranial abnormality. Electronically Signed   By: Rise Mu M.D.   On: 03/10/2021 21:33   CT HEAD WO CONTRAST  Result Date: 03/10/2021 CLINICAL DATA:  Possible seizure EXAM: CT HEAD WITHOUT CONTRAST TECHNIQUE: Contiguous axial images were obtained from the base of the skull through the vertex without intravenous contrast. COMPARISON:  None. FINDINGS: Brain: There is concern for a mass within the right frontoparietal region measuring approximately 2.5 cm. Surrounding edema throughout the posterior frontal and parietal lobes. No significant midline shift. No hydrocephalus. Vascular: No hyperdense vessel or unexpected calcification. Skull: No acute calvarial abnormality. Sinuses/Orbits: No acute findings Other: None IMPRESSION: Concern for 2.5 cm right frontoparietal mass with surrounding edema. Recommend further evaluation with MRI. These results were called by telephone at the time of interpretation on 03/10/2021 at 11:57 am to provider [facility] , who verbally acknowledged these results. Electronically Signed   By: Charlett Nose M.D.   On: 03/10/2021 11:58   CT Angio Neck W and/or Wo Contrast  Result Date: 03/10/2021 CLINICAL DATA:  Right MCA stroke EXAM: CT ANGIOGRAPHY HEAD AND NECK TECHNIQUE: Multidetector CT imaging of the head and neck was performed using the standard protocol during bolus administration of intravenous contrast. Multiplanar CT image reconstructions and MIPs were obtained to evaluate the vascular anatomy. Carotid stenosis measurements (when applicable) are obtained utilizing NASCET criteria, using the distal internal carotid diameter as the denominator. CONTRAST:  75mL OMNIPAQUE IOHEXOL 350 MG/ML SOLN COMPARISON:  Correlation made with same day prior imaging FINDINGS: CTA NECK Aortic arch: Great vessel origins are patent. Right carotid system: Patent. There is eccentric noncalcified plaque along the proximal internal carotid causing less than 50% stenosis. Left carotid  system: Patent.  No stenosis. Vertebral arteries: Patent. Codominant. There is early extracranial origin of bilateral PICAs at C1-C2. Skeleton: Degenerative changes of the cervical spine. There is least moderate canal stenosis. Multilevel foraminal narrowing. Other neck: Unremarkable. Upper chest: Included upper lungs are clear. Review of the MIP images confirms the above findings CTA HEAD Anterior circulation: Intracranial internal carotid arteries are patent anterior cerebral arteries are patent. 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 Middle cerebral arteries are patent. Posterior circulation: Intracranial  vertebral arteries are patent. Basilar artery is patent. Superior cerebellar artery origins are patent. Posterior cerebral arteries are patent. Bilateral posterior communicating arteries are present with essentially fetal origin of the PCAs. Venous sinuses: Patent as allowed by contrast bolus timing. Review of the MIP images confirms the above findings IMPRESSION: No large vessel occlusion. Noncalcified plaque causing less than 50% stenosis at the right ICA origin. Electronically Signed   By: Guadlupe Spanish M.D.   On: 03/10/2021 17:17   MR Brain W and Wo Contrast  Result Date: 03/10/2021 CLINICAL DATA:  Concern for mass on CT head EXAM: MRI HEAD WITHOUT AND WITH CONTRAST TECHNIQUE: Multiplanar, multiecho pulse sequences of the brain and surrounding structures were obtained without and with intravenous contrast. CONTRAST:  7mL GADAVIST GADOBUTROL 1 MMOL/ML IV SOLN COMPARISON:  CT head from today. FINDINGS: Brain: Restricted diffusion in the posterior right insula and right posterior frontal, parietal, and temporal lobes, compatible with acute or early subacute infarct. There is exuberant associated edema. Extensive curvilinear cortical susceptibility artifact in these regions is compatible with prominent petechial hemorrhage. Curvilinear/gyriform areas of enhancement in these regions  likely represents infarct enhancement due to blood brain barrier breakdown. Associated regional mass effect and sulcal effacement without midline shift. No hydrocephalus. No clear enhancing mass lesion. Vascular: Possible diminished right MCA flow void. Skull and upper cervical spine: Normal marrow signal. Sinuses/Orbits: Clear sinuses. Other: No sizable mastoid effusions. IMPRESSION: 1. Findings most consistent with a large acute or early subacute right posterior MCA territory infarct with exuberant cortical edema, extensive petechial hemorrhage, and gyriform enhancement likely relating to blood brain barrier breakdown. Sulcal/regional mass effect without midline shift. Recommend continued attention on follow-up imaging to ensure expected evolution and exclude underlying mass. 2. Possible diminished right MCA flow void, which could relate to slice selection versus underlying stenosis. Recommend CTA to further evaluate. Findings and recommendations discussed with Dr. Anitra Lauth via telephone at 2:58 PM. Electronically Signed   By: Feliberto Harts MD   On: 03/10/2021 15:05   EEG adult  Result Date: 03/11/2021 Charlsie Quest, MD     03/11/2021 12:09 PM Patient Name: Brandy Nguyen MRN: [id number] Epilepsy Attending: Charlsie Quest Referring Physician/Provider: Jimmye Norman, NP Date: 03/11/2021 Duration: 24.30 mins Patient history: 55 year old female with right MCA infarct with hemorrhagic transformation with first-time seizure.  EEG General weakness. Level of alertness: Awake AEDs during EEG study: Keppra Technical aspects: This EEG study was done with scalp electrodes positioned according to the 10-20 International system of electrode placement. Electrical activity was acquired at a sampling rate of  and reviewed with a high frequency filter of  and a low frequency filter of . EEG data were recorded continuously and digitally stored. Description: The posterior dominant rhythm consists of 8-9 Hz  activity of moderate voltage (25-35 uV) seen predominantly in posterior head regions, asymmetric ( R<L) and reactive to eye opening and eye closing. EEG showed continuous 3 to 6 Hz theta-delta slowing in right hemisphere, maximal right frontal region. Hyperventilation and photic stimulation were not performed.   ABNORMALITY -Continuous slow, right hemisphere, maximal right frontal region IMPRESSION: This study is suggestive of cortical dysfunction in right hemisphere, maximal right frontal region likely due to underlying stroke.  No seizures or definite epileptiform discharges were seen throughout the recording. Charlsie Quest   ECHOCARDIOGRAM COMPLETE  Result Date: 03/11/2021    ECHOCARDIOGRAM REPORT   Patient Name:   Brandy Nguyen Date of Exam: 03/11/2021 Medical Rec #:  [id number]  Height:       59.0 in Accession #:    [id number]       Weight:       150.0 lb Date of Birth:  October 28, 1965        BSA:          1.632 m Patient Age:    55 years         BP:           117/81 mmHg Patient Gender: F                HR:           80 bpm. Exam Location:  Inpatient Procedure: 2D Echo, Color Doppler and Cardiac Doppler Indications:    Stroke i63.9  History:        Patient has no prior history of Echocardiogram examinations.                 Risk Factors:Dyslipidemia and Cocaine.  Sonographer:    Irving Burton Senior RDCS Referring Phys: [id number] Estevan Ryder MCINTYRE IMPRESSIONS  1. Left ventricular ejection fraction, by estimation, is 70 to 75%. The left ventricle has hyperdynamic function. The left ventricle has no regional wall motion abnormalities. There is mild left ventricular hypertrophy. Left ventricular diastolic parameters are consistent with Grade I diastolic dysfunction (impaired relaxation).  2. Right ventricular systolic function is normal. The right ventricular size is normal.  3. The mitral valve is normal in structure. No evidence of mitral valve regurgitation. No evidence of mitral stenosis.  4. The aortic valve is  normal in structure. Aortic valve regurgitation is not visualized. Mild to moderate aortic valve sclerosis/calcification is present, without any evidence of aortic stenosis.  5. The inferior vena cava is normal in size with greater than 50% respiratory variability, suggesting right atrial pressure of 3 mmHg. Conclusion(s)/Recommendation(s): No intracardiac source of embolism detected on this transthoracic study. A transesophageal echocardiogram is recommended to exclude cardiac source of embolism if clinically indicated. FINDINGS  Left Ventricle: Left ventricular ejection fraction, by estimation, is 70 to 75%. The left ventricle has hyperdynamic function. The left ventricle has no regional wall motion abnormalities. The left ventricular internal cavity size was normal in size. There is mild left ventricular hypertrophy. Left ventricular diastolic parameters are consistent with Grade I diastolic dysfunction (impaired relaxation). Right Ventricle: The right ventricular size is normal. No increase in right ventricular wall thickness. Right ventricular systolic function is normal. Left Atrium: Left atrial size was normal in size. Right Atrium: Right atrial size was normal in size. Pericardium: There is no evidence of pericardial effusion. Mitral Valve: The mitral valve is normal in structure. No evidence of mitral valve regurgitation. No evidence of mitral valve stenosis. Tricuspid Valve: The tricuspid valve is normal in structure. Tricuspid valve regurgitation is not demonstrated. No evidence of tricuspid stenosis. Aortic Valve: The aortic valve is normal in structure. Aortic valve regurgitation is not visualized. Mild to moderate aortic valve sclerosis/calcification is present, without any evidence of aortic stenosis. Pulmonic Valve: The pulmonic valve was normal in structure. Pulmonic valve regurgitation is not visualized. No evidence of pulmonic stenosis. Aorta: The aortic root is normal in size and structure.  Venous: The inferior vena cava is normal in size with greater than 50% respiratory variability, suggesting right atrial pressure of 3 mmHg. IAS/Shunts: No atrial level shunt detected by color flow Doppler.  LEFT VENTRICLE PLAX 2D LVIDd:         3.30 cm  Diastology LVIDs:  2.40 cm  LV e' medial:    4.90 cm/s LV PW:         0.90 cm  LV E/e' medial:  9.6 LV IVS:        1.20 cm  LV e' lateral:   3.59 cm/s LVOT diam:     2.00 cm  LV E/e' lateral: 13.1 LV SV:         54 LV SV Index:   33 LVOT Area:     3.14 cm  RIGHT VENTRICLE RV S prime:     12.90 cm/s TAPSE (M-mode): 2.0 cm LEFT ATRIUM             Index       RIGHT ATRIUM           Index LA diam:        2.20 cm 1.35 cm/m  RA Area:     11.80 cm LA Vol (A2C):   33.9 ml 20.77 ml/m RA Volume:   27.70 ml  16.98 ml/m LA Vol (A4C):   13.9 ml 8.52 ml/m LA Biplane Vol: 22.7 ml 13.91 ml/m  AORTIC VALVE LVOT Vmax:   104.00 cm/s LVOT Vmean:  82.800 cm/s LVOT VTI:    0.171 m  AORTA Ao Root diam: 2.90 cm Ao Asc diam:  2.60 cm MITRAL VALVE MV Area (PHT): 2.54 cm    SHUNTS MV Decel Time: 299 msec    Systemic VTI:  0.17 m MV E velocity: 47.10 cm/s  Systemic Diam: 2.00 cm MV A velocity: 78.00 cm/s MV E/A ratio:  0.60 Donato Schultz MD Electronically signed by Donato Schultz MD Signature Date/Time: 03/11/2021/11:19:32 AM    Final     Labs:  Basic Metabolic Panel: Recent Labs  Lab 03/18/21 0505  NA 141  K 3.8  CL 105  CO2 27  GLUCOSE 124*  BUN 12  CREATININE 1.11*  CALCIUM 9.5    CBC: Recent Labs  Lab 03/18/21 0505  WBC 4.6  NEUTROABS 2.6  HGB 14.0  HCT 41.2  MCV 93.6  PLT 327    CBG: No results for input(s): GLUCAP in the last 168 hours.  Family history.  Positive for hypertension hyperlipidemia.  Negative for colon cancer esophageal cancer or rectal cancer  Brief HPI:   Brandy Nguyen is a 55 y.o. right-handed female with history of tobacco use as well as hyperlipidemia had not seen an MD in many years.  Lives with spouse independent prior to  admission.  Presented 03/10/2021 with left-sided weakness x3 days as well as blurred vision.  Family had reported a 45-second episode of shaking which resolved.  Denied loss of consciousness.  Denied any bowel or bladder incontinence.  CT/MRI showed findings consistent with large acute early subacute right posterior MCA territory infarction with exuberant cortical edema, extensive petechial hemorrhage and gyriform enhancement likely related to blood-brain barrier breakdown.  Sulcal regional mass-effect without midline shift.  CT angiogram of head and neck no large vessel occlusion.  Noncalcified plaque causing less than 50% stenosis of the right ICA origin.  Patient did not receive tPA.  EEG negative for seizure.  Echocardiogram with ejection fraction of 70 to 75% no wall motion abnormalities.  Admission chemistries unremarkable except WBC 13,200 urine drug screen positive cocaine.  Neurology follow-up maintained on low-dose aspirin.  Patient was cleared to begin Lovenox for DVT prophylaxis 03/12/2021.  Recommendations of 30-day cardiac event monitor.  She did remain on Keppra for seizure prophylaxis.  Tolerating a regular diet.  Therapy evaluations completed due to patient decreased functional ability left side weakness was admitted for a comprehensive rehab program.   Hospital Course: Brandy Nguyen was admitted to rehab 03/17/2021 for inpatient therapies to consist of PT, ST and OT at least three hours five days a week. Past admission physiatrist, therapy team and rehab RN have worked together to provide customized collaborative inpatient rehab.  Pertaining to patient's right MCA infarction with petechial hemorrhage cortical edema remained stable maintained on low-dose aspirin therapy.  30-day cardiac event monitor has been recommended.  Initially on subcutaneous Lovenox for DVT prophylaxis discontinued as patient ambulating greater than 300 feet.  Seizure for prophylaxis completed 1 week course EEG negative.   History of tobacco use polysubstance urine drug screen positive cocaine patient received Cigar cessation of of illicit drug products as well as tobacco use.  Lipitor ongoing for hyperlipidemia.  Mild AKI 1.11 encouragement of fluids patient would need follow-up chemistries.   Blood pressures were monitored on TID basis and controlled     Rehab course: During patient's stay in rehab weekly team conferences were held to monitor patient's progress, set goals and discuss barriers to discharge. At admission, patient required minimal assist 50 feet straight point cane  Physical exam.  Blood pressure 106/68 pulse 61 temperature 98.5 respirations 18 oxygen saturations 100% room air Constitutional.  No acute distress HEENT Head.  Normocephalic and atraumatic Eyes.  Pupils round and reactive to light.  Right gaze preference Neck.  Supple nontender no JVD without thyromegaly Cardiac regular rate rhythm not extra sounds or murmur heard Abdomen.  Soft nontender positive bowel sounds without rebound Respiratory effort normal no respiratory distress without wheeze Skin.  Warm and dry Neurologic.  Alert oriented speech mildly dysarthric but intelligible provides name and age follows commands.  Strength gross graded 5/5 throughout.  Mild decrease sensation on the left  He/She  has had improvement in activity tolerance, balance, postural control as well as ability to compensate for deficits. He/She has had improvement in functional use RUE/LUE  and RLE/LLE as well as improvement in awareness.  Supine to sit supervision assist.  Ambulates 350 feet supervision.  Gait training also performed on unlevel cement sidewalk without assistive device and supervision.  Gather his belongings for activities day living and homemaking.  Was advised the need for supervision due to her right gaze preference.  Full family teaching completed plan discharged home       Disposition: Discharged home    Diet:  Regular  Special Instructions: No driving smoking or alcohol  Medications at discharge 1.  Tylenol as needed 2.  Aspirin 81 mg p.o. daily 3.  Lipitor 40 mg p.o. daily 4.  NicoDerm patch taper as directed  30-35 minutes were spent completing discharge summary and discharge planning  Discharge Instructions     Ambulatory referral to Neurology   Complete by: As directed    An appointment is requested in approximately: 4 weeks right MCA infarction   Ambulatory referral to Physical Medicine Rehab   Complete by: As directed    Moderate complexity follow-up 1 to 2 weeks right MCA infarction        Follow-up Information     Raulkar, Drema Pry, MD Follow up.   Specialty: Physical Medicine and Rehabilitation Why: 06/04/21  09:20am Contact information: [address] [place] [address] [phone]         Grayce Sessions, NP Follow up on 04/13/2021.   Specialty: Internal Medicine Why: Appointment @  9:30 Am Contact information: [address] [place] [id number] [phone]                 Signed: Mcarthur RossettiDaniel J Etha Stambaugh 03/24/2021, 5:27 AM

## 2021-03-23 ENCOUNTER — Other Ambulatory Visit (HOSPITAL_COMMUNITY): Payer: Self-pay

## 2021-03-23 MED ORDER — NICOTINE 14 MG/24HR TD PT24
MEDICATED_PATCH | TRANSDERMAL | 0 refills | Status: DC
  Filled 2021-03-23: qty 28, fill #0

## 2021-03-23 MED ORDER — POTASSIUM CHLORIDE 20 MEQ PO PACK
20.0000 meq | PACK | Freq: Once | ORAL | Status: AC
  Administered 2021-03-23: 20 meq via ORAL
  Filled 2021-03-23: qty 1

## 2021-03-23 MED ORDER — NICOTINE 7 MG/24HR TD PT24
MEDICATED_PATCH | TRANSDERMAL | 0 refills | Status: DC
  Filled 2021-03-23: qty 30, fill #0

## 2021-03-23 MED ORDER — ATORVASTATIN CALCIUM 40 MG PO TABS
40.0000 mg | ORAL_TABLET | Freq: Every day | ORAL | 0 refills | Status: DC
  Filled 2021-03-23: qty 30, 30d supply, fill #0

## 2021-03-23 MED ORDER — NICOTINE 14 MG/24HR TD PT24
MEDICATED_PATCH | TRANSDERMAL | 0 refills | Status: DC
  Filled 2021-03-23: qty 14, fill #0

## 2021-03-23 NOTE — Progress Notes (Signed)
Patient ID: Brandy Nguyen, female   DOB: May 12, 1966, 55 y.o.   MRN: [id number] Follow up with the patient regarding pending discharge. Discussed home medications and need to [facility] along with appointment scheduled at the [facility]  April 13, 2021. Patient is familiar with the clinic; close to her home. Reviewed DASH diet, dietary modifications , medications, and smoking cessation to decrease secondary stroke risks. Patient states an understanding of the information reviewed. Anticipate discharge tomorrow and patient feels comfortable with the discharge plan. Pamelia Hoit, RN

## 2021-03-23 NOTE — Progress Notes (Signed)
Occupational Therapy Discharge Summary  Patient Details  Name: Brandy Nguyen MRN: [id number] Date of Birth: 03/31/1966  Today's Date: 03/23/2021 OT Individual Time: 0830-0930 OT Individual Time Calculation (min): 60 min     Pt seen for BADL retraining of toileting, bathing, and dressing with a focus on pt completing tasks at a mod I to independent level. Provided pt with new clothing and pt then ambulated to bathroom and completed self care with no cues needed, demonstrated safe mobility and balance. Pt was independent with her self care, used tub bench for safe tub transfers.  Pt worked on housekeeping in the room with picking up towels off the floor and putting them in hamper, organizing her room.   Pt ambulated to ADL apt with min cues for path finding in hospital. In apartment, pt practiced mopping and vacuuming demonstrating good visual scanning and balance.  In kitchen, discussed safe cooking with only preparing "cold" items. Asked her not to use the stove or cut items unless her sister is with her.    Ambulated back to room with cues for pathfinding but no loss of balance. Pt provided a handout to share with her family on Left visual field cut on compensatory strategies and safety concerns.       Patient has met 10 of 10 long term goals due to improved activity tolerance, improved balance, postural control, ability to compensate for deficits, improved attention, improved awareness, and improved coordination.  Patient to discharge at overall independent to Modified Independent level for basic ADLs, pt will need supervision with community ambulation and complex cooking.   Patient's care partner is independent to provide the necessary physical and cognitive assistance at discharge.    Reasons goals not met: n/a  Recommendation:  Patient will benefit from ongoing skilled OT services in home health setting to continue to advance functional skills in the area of BADL, iADL, and Vocation.   Unfortunately, pt does not have insurance coverage for this at this time.   Equipment: No equipment provided - Pt's family has a tub bench for her  Reasons for discharge: treatment goals met  Patient/family agrees with progress made and goals achieved: Yes  OT Discharge Precautions/Restrictions   L visual field cut    ADL ADL Eating: Independent Grooming: Independent Where Assessed-Grooming: Standing at sink Upper Body Bathing: Independent Where Assessed-Upper Body Bathing: Shower Lower Body Bathing: Modified independent Where Assessed-Lower Body Bathing: Shower Upper Body Dressing: Independent Where Assessed-Upper Body Dressing: Edge of bed Lower Body Dressing: Independent Where Assessed-Lower Body Dressing: Edge of bed Toileting: Independent Where Assessed-Toileting: Glass blower/designer: Programmer, applications Method: Magazine features editor: Chief Financial Officer Method: Heritage manager: Radio broadcast assistant, Grab bars Vision Patient Visual Report: Blurring of vision;Peripheral vision impairment Visual Fields: Left homonymous hemianopsia Additional Comments: pt is adapting to her L visual field cut and learning to turn her head to scan with no cues Perception  Inattention/Neglect: Does not attend to left visual field;Other (comment) Praxis Praxis: Intact Cognition Overall Cognitive Status: Within Functional Limits for tasks assessed Sensation Sensation Light Touch: Appears Intact Hot/Cold: Appears Intact Proprioception: Appears Intact Stereognosis: Appears Intact Coordination Gross Motor Movements are Fluid and Coordinated: No Fine Motor Movements are Fluid and Coordinated: Yes Coordination and Movement Description: Movement patterns impacted by visual deficit - guarded and cautious Finger Nose Finger Test: WNL Motor  Motor Motor - Discharge Observations: mild L hemi - improved awareness and motor  control since date of evaluation  Mobility  Bed Mobility Rolling Right: Independent Rolling Left: Independent Supine to Sit: Independent Sit to Supine: Independent Transfers Sit to Stand: Independent Stand to Sit: Independent  Trunk/Postural Assessment  Cervical Assessment Cervical Assessment: Within Functional Limits Thoracic Assessment Thoracic Assessment: Within Functional Limits Lumbar Assessment Lumbar Assessment: Within Functional Limits Postural Control Postural Control: Within Functional Limits  Balance Dynamic Standing Balance Dynamic Standing - Level of Assistance: 7: Independent (independent for BADLs and light housekeeping) Extremity/Trunk Assessment RUE Assessment RUE Assessment: Within Functional Limits LUE Assessment Active Range of Motion (AROM) Comments: [facility] General Strength Comments: sh flexion and abduction 4-/5   Able Malloy 03/23/2021, 12:49 PM

## 2021-03-23 NOTE — Progress Notes (Signed)
Inpatient Rehabilitation Care Coordinator Discharge Note  The overall goal for the admission was met for:   Discharge location: [place]  Length of Stay: Yes-7 DAYS  Discharge activity level: Yes-INDEPENDENT LEVEL  Home/community participation: Yes  Services provided included: MD, RD, PT, OT, SLP, RN, CM, Pharmacy, and SW  Financial Services: Other: SELF PAY-APPLYING FOR MEDICAID  Choices offered to/list presented to:PT  Follow-up services arranged: Other: NO NEEDS  MATCH FOR PRESCRITION ASSISTANCE. PCP-[facility]AR[facility] 7/11 @ 9:30 AM  Comments (or additional information):PT REACHED INDEPENDENT LEVEL GOALS.   Patient/Family verbalized understanding of follow-up arrangements: Yes  Individual responsible for coordination of the follow-up plan: SELF [phone]  Confirmed correct DME delivered: Elease Hashimoto 03/23/2021    Inza Mikrut, Gard[place] Rhyme

## 2021-03-23 NOTE — Progress Notes (Signed)
Received a call from Dauda RN at 18:20 , reporting Brandy Nguyen was trying to leave AMA. Dr Carlis Abbott note was reviewed, this provider was in contact with Dr. Carlis Abbott regarding the above, Brandy Nguyen is scheduled to be discharge tomorrow, staff was encourage to sit with [name] and provide emotional support. At this time we will continue to monitor.

## 2021-03-23 NOTE — Progress Notes (Signed)
At around 1730 this RN went to patient room to give her med, patient was standing by the window. This RN asked patient to sit in her chair which she refused and said someone from therapy has told her that she can walk in her room. This RN tried to educated patient on safety but she refused and said she wanted to go home. She packed her belongings and called her sister to come and pick her up but her sister told her to wait for tomorrow. Patient insisted that she is leaving and that she was going to catch the bus. On-call notified. After about an hour the nursing staffs were finally able to calm her down and she decided to stay and wait till tomorrow. We continue to monitor.

## 2021-03-23 NOTE — Progress Notes (Signed)
PROGRESS NOTE   Subjective/Complaints: No complaints today  ROS: +left visual field cut, denies pain, constipation.   Objective:   No results found. No results for input(s): WBC, HGB, HCT, PLT in the last 72 hours.  No results for input(s): NA, K, CL, CO2, GLUCOSE, BUN, CREATININE, CALCIUM in the last 72 hours.   Intake/Output Summary (Last 24 hours) at 03/23/2021 1542 Last data filed at 03/23/2021 0819 Gross per 24 hour  Intake 440 ml  Output --  Net 440 ml        Physical Exam: Vital Signs Blood pressure 125/81, pulse 63, temperature 98.1 F (36.7 C), temperature source Oral, resp. rate 17, height 4\' 11"  (1.499 m), weight 66.6 kg, SpO2 100 %. Gen: no distress, normal appearing HEENT: oral mucosa pink and moist, NCAT Cardio: Reg rate Chest: normal effort, normal rate of breathing Abd: soft, non-distended Ext: no edema Psych: pleasant, normal affect   Skin: intact Neurological:    Comments: Patient is alert.  No acute distress.  Speech is slightly dysarthric but intelligible.  Right gaze preference.  Provides name and age.  Follows simple commands. 5/5 strength throughout. Decreased sensation on left side. Decreased awareness of deficits. Severe left visual field cut     Assessment/Plan: 1. Functional deficits which require 3+ hours per day of interdisciplinary therapy in a comprehensive inpatient rehab setting. Physiatrist is providing close team supervision and 24 hour management of active medical problems listed below. Physiatrist and rehab team continue to assess barriers to discharge/monitor patient progress toward functional and medical goals  Care Tool:  Bathing    Body parts bathed by patient: Right arm, Left arm, Chest, Abdomen, Front perineal area, Buttocks, Right upper leg, Left upper leg, Right lower leg, Left lower leg, Face   Body parts bathed by helper: Right arm, Left arm, Chest, Abdomen,  Front perineal area, Buttocks, Right upper leg, Left upper leg, Right lower leg, Left lower leg, Face     Bathing assist Assist Level: Independent with assistive device     Upper Body Dressing/Undressing Upper body dressing   What is the patient wearing?: Pull over shirt    Upper body assist Assist Level: Independent    Lower Body Dressing/Undressing Lower body dressing      What is the patient wearing?: Underwear/pull up, Pants     Lower body assist Assist for lower body dressing: Independent     Toileting Toileting    Toileting assist Assist for toileting: Independent     Transfers Chair/bed transfer  Transfers assist     Chair/bed transfer assist level: Independent     Locomotion Ambulation   Ambulation assist      Assist level: Independent Assistive device: No Device Max distance: 558ft   Walk 10 feet activity   Assist     Assist level: Independent Assistive device: No Device   Walk 50 feet activity   Assist    Assist level: Independent Assistive device: No Device    Walk 150 feet activity   Assist    Assist level: Independent Assistive device: No Device    Walk 10 feet on uneven surface  activity   Assist  Assist level: Supervision/Verbal cueing     Wheelchair     Assist Will patient use wheelchair at discharge?: No   Wheelchair activity did not occur: N/A         Wheelchair 50 feet with 2 turns activity    Assist    Wheelchair 50 feet with 2 turns activity did not occur: N/A       Wheelchair 150 feet activity     Assist  Wheelchair 150 feet activity did not occur: N/A       Blood pressure 125/81, pulse 63, temperature 98.1 F (36.7 C), temperature source Oral, resp. rate 17, height 4\' 11"  (1.499 m), weight 66.6 kg, SpO2 100 %.  Medical Problem List and Plan: 1.  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  Plan  30-day cardiac event monitor             -patient may shower             -ELOS/Goals: 5-7 days S to modI             Continue CIR 2.  Impaired mobility: d/c Lovenox since ambulating 349feet.              -antiplatelet therapy: Continue Aspirin 81 mg daily 3. Pain Management: N/A. Tylenol as needed 4. Mood: Provide emotional support             -antipsychotic agents: N/A 5. Neuropsych: This patient is capable of making decisions on her own behalf. 6. Skin/Wound Care: Routine skin checks 7. Fluids/Electrolytes/Nutrition: Routine in and out with follow-up chemistries 8.  Seizure prophylaxis.  Discontinue Keppra since one week seizure free. EEG negative 9.  Tobacco/polysubstance abuse.  Urine drug screen positive cocaine.  Provide counseling 10.  Hyperlipidemia. Continue Lipitor 11. AKI: placed nursing order for 6-8 glasses of water per day, discussed with nursing and patient. Improving, but still elevated, repeat tomorrow.  12. Overweight BMI 29.66: provide dietary education.  13. Left visual field cut: Continue compensatory strategies. Discussed that she should not drive at home- primarily uses bus for transport. 14. Hypokalemia: 120f supplement today, repeat tomorrow 15. Disposition: messaged Lisa to schedule f/u with me. Patient needs f/u scheduled with new PCP as well.     LOS: 6 days A FACE TO FACE EVALUATION WAS PERFORMED  03/23/2021, 3:42 PM

## 2021-03-23 NOTE — Discharge Summary (Signed)
Physical Therapy Discharge Summary  Patient Details  Name: Brandy Nguyen MRN: [id number] Date of Birth: June 07, 1966  Today's Date: 03/23/2021 PT Individual Time: 1000-1025 + 1100-1158 PT Individual Time Calculation (min): 25 min  + 58 min   Patient has met 9 of 9 long term goals due to improved activity tolerance, improved balance, improved postural control, ability to compensate for deficits, functional use of  left upper extremity and left lower extremity, improved attention, and improved awareness.  Patient to discharge at an ambulatory level Independent.   Patient's care partner is independent to provide the necessary physical and cognitive assistance at discharge.  Reasons goals not met: n/a  Recommendation:  Patient will benefit from ongoing skilled PT services in outpatient setting to continue to advance safe functional mobility, address ongoing impairments in L visual field cut, L hemibody weakness, and higher level gait/balance deficits in order to minimize fall risk. (Pt is uninsured, therefore, decreased ability to achieve follow-up care).  Equipment: No equipment provided  Reasons for discharge: treatment goals met and discharge from hospital  Patient/family agrees with progress made and goals achieved: Yes  PT Discharge Precautions/Restrictions Precautions Precautions: Fall Precaution Comments: L hemianopsia Restrictions Weight Bearing Restrictions: No Vision/Perception  Vision - Assessment Ocular Range of Motion: Within Functional Limits Tracking/Visual Pursuits: Able to track stimulus in all quads without difficulty Additional Comments: L visual field cut Perception Perception: Impaired Inattention/Neglect: Does not attend to left visual field;Other (comment) (improved ability to compensate for deficits) Praxis Praxis: Intact  Cognition Overall Cognitive Status: Within Functional Limits for tasks assessed Arousal/Alertness: Awake/alert Orientation Level:  Oriented X4 Attention: Focused;Sustained Focused Attention: Appears intact Sustained Attention: Appears intact Problem Solving: Appears intact Safety/Judgment: Appears intact (Aware of L visual field deficit) Sensation Sensation Light Touch: Appears Intact Hot/Cold: Appears Intact Proprioception: Appears Intact Stereognosis: Appears Intact Coordination Gross Motor Movements are Fluid and Coordinated: No Fine Motor Movements are Fluid and Coordinated: No Coordination and Movement Description: Movement patterns impacted by visual deficit - guarded and cautious Heel Shin Test: WNL Motor  Motor Motor: Hemiplegia Motor - Discharge Observations: mild L hemi - improved awareness and motor control since date of evaluation  Mobility Bed Mobility Rolling Right: Independent Rolling Left: Independent Supine to Sit: Independent Sit to Supine: Independent Transfers Transfers: Sit to Stand;Stand Pivot Transfers;Stand to Sit Sit to Stand: Independent Stand to Sit: Independent Stand Pivot Transfers: Independent Transfer (Assistive device): None Locomotion  Gait Ambulation: Yes Gait Assistance: Independent with assistive device Gait Distance (Feet): 500 Feet Assistive device: None Gait Gait: Yes Gait Pattern: Within Functional Limits (decreased L arm swing, decreased gait speed) Gait velocity: decreasd Stairs / Additional Locomotion Stairs: Yes Stairs Assistance: Supervision/Verbal cueing Stair Management Technique: Two rails Number of Stairs: 12 Height of Stairs: 6 Ramp: Supervision/Verbal cueing Curb: Supervision/Verbal cueing Wheelchair Mobility Wheelchair Mobility: No  Trunk/Postural Assessment  Cervical Assessment Cervical Assessment: Within Functional Limits Thoracic Assessment Thoracic Assessment: Within Functional Limits Lumbar Assessment Lumbar Assessment: Within Functional Limits Postural Control Postural Control: Within Functional Limits   Balance Balance Balance Assessed: Yes Static Sitting Balance Static Sitting - Balance Support: Feet supported Static Sitting - Level of Assistance: 7: Independent Dynamic Sitting Balance Dynamic Sitting - Balance Support: During functional activity Dynamic Sitting - Level of Assistance: 5: Stand by assistance Static Standing Balance Static Standing - Balance Support: No upper extremity supported Static Standing - Level of Assistance: 7: Independent Dynamic Standing Balance Dynamic Standing - Balance Support: During functional activity;No upper extremity supported Dynamic Standing -  Level of Assistance: 5: Stand by assistance Extremity Assessment  RLE Assessment RLE Assessment: Within Functional Limits General Strength Comments: Grossly 5/5 LLE Assessment LLE Assessment: Exceptions to [facility] General Strength Comments: Grossly 4/5   Skilled Intervention:  1st session: Pt greeted seated in recliner, agreeable to therapy session. No reports of pain. Attempting to call [employer] to discuss coverage. Sit<>stand indep with no AD from recliner and ambulated throughout room indep with no cues needed for visual scanning. She ambulated to ortho gym indep with no AD, needing only cues for directions - she completed car transfer with supervision via lateral stepping method with no LOB. She was able to navigate up/down 35f ramp indep, ambulate on unlevel mulch surfaces with supervision, and navigate curbs with supervision and no AD. She performed furniture transfers in ADL apartment room with indep and no difficulty. She navigated up/down 12 steps with 1 hand rail and distant supervision with good safety awareness noted. Next, she completed 5 minutes of Kinetron while standing with workload/resistance set to ~25cm/sec - working on strengthening hip musculature with functional reciprocal movement pattern. She returend to her room and remained seated in recliner at the end of the session with all  needs met.    2nd session: Pt received sitting in recliner and is agreeable to PT tx - pt requesting to go outside for some "fresh air." Completed sit<>stand transfers indep from recliner and ambulated from her room downstairs to the [facility] entrance (standing rest break in elevator), >5066fwith indep and no AD and cues only needed for direction. While outdoors, practiced ambulating on unlevel concrete surfaces, navigating curbs, going up/down stairs with 1 hand rail, and crossing the street via crosswalk to simulate community reintegration - completed all activities without cues with good safety awareness noted. She also performed transfers to various sitting surfaces such as park benches and seats without arm rests. She then practiced navigating the hospital gift shop and searching for various items - able to ambulate in close quarters of the gift shop without difficulty. Next, practiced ambulating in busy hallways of hospital where she showed good ability to navigate people walking past her and around her. She ambulated back upstairs to main rehab floor to the gym to finish session working on balance and strengthening. While standing on blue air-ex foam pad with supervision, she completed chest press, shldr press, and thoracic rotations with 4# dowel rod - maintained balance throughout without LOB or knee buckling. She was returned to her room where she remained seated in recliner at end of session with all needs met.   Davinci Glotfelty P Tregan Read PT, DPT 03/23/2021, 7:57 AM

## 2021-03-23 NOTE — Progress Notes (Signed)
Speech Language Pathology Discharge Summary  Patient Details  Name: Brandy Nguyen MRN: [id number] Date of Birth: [birth year]  Today's Date: 03/23/2021 SLP Individual Time: 1400-1500 SLP Individual Time Calculation (min): 60 min   Skilled Therapeutic Interventions:  Skilled SLP intervention focused on dc education. Pt instructed on external aids to increase organization with all appointments and recall of appointments. Pt completed simple calendar planning task with supervision A verbal cues. Pt demonstrated good awareness of physical limitations and tasks she will need assistance with once home. Pt being dc'd home tomorrow.    Patient has met 3 of 3 long term goals.  Patient to discharge at overall Supervision level.  Reasons goals not met:     Clinical Impression/Discharge Summary:   Pt has met 3 of 3 long term goals this reporting period due to improved problem solving, improved memory, and improved attention. She continues to require min A for attention to details and error awareness. Pt will discharge home with assistance from sister.Discharge education has been completed with patient.  Care Partner:  Caregiver Able to Provide Assistance: Yes  Type of Caregiver Assistance: Cognitive;Physical  Recommendation:  24 hour supervision/assistance;Home Health SLP  Rationale for SLP Follow Up: Maximize functional communication   Equipment: no equipment recommended at this time.   Reasons for discharge: Discharged from hospital        [place] 03/23/2021, 2:38 PM

## 2021-03-24 LAB — BASIC METABOLIC PANEL
Anion gap: 13 (ref 5–15)
BUN: 15 mg/dL (ref 6–20)
CO2: 29 mmol/L (ref 22–32)
Calcium: 10.3 mg/dL (ref 8.9–10.3)
Chloride: 101 mmol/L (ref 98–111)
Creatinine, Ser: 1.08 mg/dL — ABNORMAL HIGH (ref 0.44–1.00)
GFR, Estimated: 60 mL/min — ABNORMAL LOW (ref >60)
Glucose, Bld: 109 mg/dL — ABNORMAL HIGH (ref 70–99)
Potassium: 4.5 mmol/L (ref 3.5–5.1)
Sodium: 143 mmol/L (ref 135–145)

## 2021-03-24 NOTE — Progress Notes (Signed)
Patient discharged home via private vehicle with family member, discharge education provided for follow up care and medication regimen by Remonia Richter. Patient exhibiting no signs or symptoms of distress upon discharge

## 2021-03-24 NOTE — Progress Notes (Signed)
PROGRESS NOTE   Subjective/Complaints: Stable for d/c today She is on phone with disability services Wanted to leave last night but had no one to pick her up and was not permitted to. Cr continues to improve  ROS: +left visual field cut, denies pain, constipation.   Objective:   No results found. No results for input(s): WBC, HGB, HCT, PLT in the last 72 hours.  Recent Labs    03/24/21 0504  NA 143  K 4.5  CL 101  CO2 29  GLUCOSE 109*  BUN 15  CREATININE 1.08*  CALCIUM 10.3     Intake/Output Summary (Last 24 hours) at 03/24/2021 0914 Last data filed at 03/24/2021 0731 Gross per 24 hour  Intake 200 ml  Output --  Net 200 ml        Physical Exam: Vital Signs Blood pressure 117/84, pulse (!) 54, temperature 98 F (36.7 C), temperature source Oral, resp. rate 16, height 4\' 11"  (1.499 m), weight 66.6 kg, SpO2 99 %. Gen: no distress, normal appearing HEENT: oral mucosa pink and moist, NCAT Cardio: Bradycardia Chest: normal effort, normal rate of breathing Abd: soft, non-distended Ext: no edema Psych: pleasant, normal affect Skin: intact Neurological:    Comments: Patient is alert.  No acute distress.  Speech is slightly dysarthric but intelligible.  Right gaze preference.  Provides name and age.  Follows simple commands. 5/5 strength throughout. Decreased sensation on left side. Decreased awareness of deficits. Severe left visual field cut     Assessment/Plan: 1. Functional deficits which require 3+ hours per day of interdisciplinary therapy in a comprehensive inpatient rehab setting. Physiatrist is providing close team supervision and 24 hour management of active medical problems listed below. Physiatrist and rehab team continue to assess barriers to discharge/monitor patient progress toward functional and medical goals  Care Tool:  Bathing    Body parts bathed by patient: Right arm, Left arm, Chest,  Abdomen, Front perineal area, Buttocks, Right upper leg, Left upper leg, Right lower leg, Left lower leg, Face   Body parts bathed by helper: Right arm, Left arm, Chest, Abdomen, Front perineal area, Buttocks, Right upper leg, Left upper leg, Right lower leg, Left lower leg, Face     Bathing assist Assist Level: Independent with assistive device     Upper Body Dressing/Undressing Upper body dressing   What is the patient wearing?: Pull over shirt    Upper body assist Assist Level: Independent    Lower Body Dressing/Undressing Lower body dressing      What is the patient wearing?: Underwear/pull up, Pants     Lower body assist Assist for lower body dressing: Independent     Toileting Toileting    Toileting assist Assist for toileting: Independent     Transfers Chair/bed transfer  Transfers assist     Chair/bed transfer assist level: Independent     Locomotion Ambulation   Ambulation assist      Assist level: Independent Assistive device: No Device Max distance: 584ft   Walk 10 feet activity   Assist     Assist level: Independent Assistive device: No Device   Walk 50 feet activity   Assist    Assist  level: Independent Assistive device: No Device    Walk 150 feet activity   Assist    Assist level: Independent Assistive device: No Device    Walk 10 feet on uneven surface  activity   Assist     Assist level: Supervision/Verbal cueing     Wheelchair     Assist Will patient use wheelchair at discharge?: No   Wheelchair activity did not occur: N/A         Wheelchair 50 feet with 2 turns activity    Assist    Wheelchair 50 feet with 2 turns activity did not occur: N/A       Wheelchair 150 feet activity     Assist  Wheelchair 150 feet activity did not occur: N/A       Blood pressure 117/84, pulse (!) 54, temperature 98 F (36.7 C), temperature source Oral, resp. rate 16, height 4\' 11"  (1.499 m), weight  66.6 kg, SpO2 99 %.  Medical Problem List and Plan: 1.  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  Plan 30-day cardiac event monitor             -patient may shower             -ELOS/Goals: 5-7 days S to modI            D/c home today 2.  Impaired mobility: d/c Lovenox since ambulating 362feet.              -antiplatelet therapy: Continue Aspirin 81 mg daily 3. Pain Management: N/A. Tylenol as needed 4. Mood: Provide emotional support             -antipsychotic agents: N/A 5. Neuropsych: This patient is capable of making decisions on her own behalf. 6. Skin/Wound Care: Routine skin checks 7. Fluids/Electrolytes/Nutrition: Routine in and out with follow-up chemistries 8.  Seizure prophylaxis.  Discontinue Keppra since one week seizure free. EEG negative 9.  Tobacco/polysubstance abuse.  Urine drug screen positive cocaine.  Provide counseling 10.  Hyperlipidemia. Continue Lipitor 11. AKI: placed nursing order for 6-8 glasses of water per day, discussed with nursing and patient. Improving, but still elevated, repeat outpatient 12. Overweight BMI 29.66: provide dietary education.  13. Left visual field cut: Continue compensatory strategies. Discussed that she should not drive at home- primarily uses bus for transport. 14. Hypokalemia: resolved, repeat outpatient 15. Bradycardia: medications reviewed and not receiving any that would cause bradycaria 16. Disposition: messaged Lisa to schedule f/u with me. Patient needs f/u scheduled with new PCP as well. D/c home today   >30 minutes spent in discharge of patient including review of medications and follow-up appointments, physical examination, and in answering all patient's questions     LOS: 7 days A FACE TO FACE EVALUATION WAS PERFORMED  120f Brandy Nguyen 03/24/2021, 9:14 AM

## 2021-03-25 ENCOUNTER — Encounter: Payer: Self-pay | Admitting: *Deleted

## 2021-03-25 NOTE — Progress Notes (Signed)
Patient ID: Brandy Nguyen, female   DOB: 1965/12/07, 55 y.o.   MRN: [id number] Patient enrolled for [employer] to ship a 30 day cardiac event monitor to address on file. Letter with monitor and discounted self pay program information mailed to patient.

## 2021-04-13 ENCOUNTER — Other Ambulatory Visit: Payer: Self-pay

## 2021-04-13 ENCOUNTER — Encounter (INDEPENDENT_AMBULATORY_CARE_PROVIDER_SITE_OTHER): Payer: Self-pay | Admitting: Primary Care

## 2021-04-13 ENCOUNTER — Ambulatory Visit (INDEPENDENT_AMBULATORY_CARE_PROVIDER_SITE_OTHER): Payer: Self-pay | Admitting: Primary Care

## 2021-04-13 ENCOUNTER — Telehealth: Payer: Self-pay

## 2021-04-13 VITALS — BP 155/97 | HR 61 | Temp 97.3°F | Ht 59.0 in | Wt 147.4 lb

## 2021-04-13 DIAGNOSIS — R569 Unspecified convulsions: Secondary | ICD-10-CM

## 2021-04-13 DIAGNOSIS — Z09 Encounter for follow-up examination after completed treatment for conditions other than malignant neoplasm: Secondary | ICD-10-CM

## 2021-04-13 DIAGNOSIS — I639 Cerebral infarction, unspecified: Secondary | ICD-10-CM

## 2021-04-13 DIAGNOSIS — F17210 Nicotine dependence, cigarettes, uncomplicated: Secondary | ICD-10-CM

## 2021-04-13 DIAGNOSIS — I63511 Cerebral infarction due to unspecified occlusion or stenosis of right middle cerebral artery: Secondary | ICD-10-CM

## 2021-04-13 DIAGNOSIS — Z720 Tobacco use: Secondary | ICD-10-CM

## 2021-04-13 DIAGNOSIS — Z7689 Persons encountering health services in other specified circumstances: Secondary | ICD-10-CM

## 2021-04-13 DIAGNOSIS — I10 Essential (primary) hypertension: Secondary | ICD-10-CM

## 2021-04-13 MED ORDER — HYDROCHLOROTHIAZIDE 25 MG PO TABS
25.0000 mg | ORAL_TABLET | Freq: Every day | ORAL | 0 refills | Status: DC
  Filled 2021-04-13: qty 30, 30d supply, fill #0

## 2021-04-13 MED ORDER — NICOTINE 14 MG/24HR TD PT24
MEDICATED_PATCH | TRANSDERMAL | 0 refills | Status: DC
  Filled 2021-04-13: qty 14, 14d supply, fill #0

## 2021-04-13 MED ORDER — ATORVASTATIN CALCIUM 40 MG PO TABS
40.0000 mg | ORAL_TABLET | Freq: Every day | ORAL | 1 refills | Status: DC
  Filled 2021-04-13: qty 30, 30d supply, fill #0

## 2021-04-13 MED ORDER — NICOTINE 7 MG/24HR TD PT24
MEDICATED_PATCH | TRANSDERMAL | 0 refills | Status: DC
Start: 2021-04-13 — End: 2021-12-21
  Filled 2021-04-13: qty 28, 28d supply, fill #0

## 2021-04-13 MED ORDER — AMLODIPINE BESYLATE 10 MG PO TABS
10.0000 mg | ORAL_TABLET | Freq: Every day | ORAL | 0 refills | Status: DC
  Filled 2021-04-13: qty 30, 30d supply, fill #0

## 2021-04-13 NOTE — Telephone Encounter (Signed)
Opened in error

## 2021-04-13 NOTE — Patient Instructions (Signed)

## 2021-04-13 NOTE — Progress Notes (Signed)
[facility]   Subjective:  [name] is a 55 y.o. female presents for hospital follow up and establish care. Admit date to the hospital was 03/17/21, patient was discharged from the hospital on 03/24/21, patient was admitted for:  Right middle cerebral artery stroke . Presented to ED with on 03/10/2021 with left-sided weakness x3 days as well as blurred vision.  Family had reported a 45-second episode of shaking which resolved. Past Medical History:  Diagnosis Date   Hyperlipidemia 03/11/2021     No Known Allergies    Current Outpatient Medications on File Prior to Visit  Medication Sig Dispense Refill   acetaminophen (TYLENOL) 325 MG tablet Take 2 tablets (650 mg total) by mouth every 6 (six) hours as needed for mild pain (or Fever >/= 101).     aspirin 81 MG chewable tablet Chew 1 tablet (81 mg total) by mouth daily.     atorvastatin (LIPITOR) 40 MG tablet Take 1 tablet (40 mg total) by mouth daily. 30 tablet 0   nicotine (NICODERM CQ - DOSED IN MG/24 HOURS) 14 mg/24hr patch Apply 1 patch for daily x2 weeks 14 patch 0   nicotine (NICODERM CQ - DOSED IN MG/24 HR) 7 mg/24hr patch Apply 1 patch (7 mg patch) daily x3 weeks and stop  Start this after completing 2 weeks of 14mg  patches 28 patch 0   No current facility-administered medications on file prior to visit.     Review of System: Review of Systems  HENT:  Positive for hearing loss.   Eyes:        Visual disturbance   Neurological:  Positive for weakness.  All other systems reviewed and are negative.  Objective:  BP (!) 155/97 (BP Location: Right Arm, Patient Position: Sitting, Cuff Size: Normal)   Pulse 61   Temp (!) 97.3 F (36.3 C) (Temporal)   Ht 4\' 11"  (1.499 m)   Wt 147 lb 6.4 oz (66.9 kg)   SpO2 98%   BMI 29.77 kg/m   Physical Exam: General Appearance: Well nourished, in no apparent distress. Eyes: PERRLA, EOMs, conjunctiva no swelling or erythema Sinuses: No Frontal/maxillary  tenderness ENT/Mouth: Ext aud canals clear, TMs without erythema, bulging. No erythema, swelling, or exudate on post pharynx.  Tonsils not swollen or erythematous. Hearing normal.  Neck: Supple, thyroid normal.  Respiratory: Respiratory effort normal, BS equal bilaterally without rales, rhonchi, wheezing or stridor.  Cardio: RRR with no MRGs. Brisk peripheral pulses without edema.  Abdomen: Soft, + BS.  Non tender, no guarding, rebound, hernias, masses. Lymphatics: Non tender without lymphadenopathy.  Musculoskeletal: Full ROM, 4/5 strength, normal gait.  Skin: Warm, dry without rashes, lesions, ecchymosis.  Neuro: Cranial nerves intact. Normal muscle tone, no cerebellar symptoms. Sensation intact.  Psych: Awake and oriented X 3, normal affect, Insight and Judgment appropriate.    Assessment:  Brandy Nguyen was seen today for hospitalization follow-up.  Diagnoses and all orders for this visit:  Right middle cerebral artery stroke (HCC) -     atorvastatin (LIPITOR) 40 MG tablet; Take 1 tablet (40 mg total) by mouth daily.  Seizure (HCC) Will be followed by neurology currently on Keppra   Essential hypertension Counseled on blood pressure goal of less than 130/80, low-sodium, DASH diet, medication compliance, 150 minutes of moderate intensity exercise per week. Discussed medication compliance, adverse effects.  -     amLODipine (NORVASC) 10 MG tablet; Take 1 tablet (10 mg total) by mouth daily. -     hydrochlorothiazide (  HYDRODIURIL) 25 MG tablet; Take 1 tablet (25 mg total) by mouth daily.  Hospital discharge follow-up Retrieved from D/c summary  Follow up with Raulkar, Drema Pry, MD (Physical Medicine and Rehabilitation); 06/04/21  09:20am Follow up with Grayce Sessions, NP (Internal Medicine) on 04/13/2021; Appointment @ 9:30 Am Appt schedule with Dr. Allyson Sabal cardiology   Encounter to establish care Establish care with PCP  Cerebrovascular accident (CVA), unspecified mechanism  (HCC) Followed by neurology  -     atorvastatin (LIPITOR) 40 MG tablet; Take 1 tablet (40 mg total) by mouth daily.  Other orders Tobacco abuse - I have recommended complete cessation of tobacco use. I have discussed various options available for assistance with tobacco cessation including over the counter methods (Nicotine gum, patch and lozenges). We also discussed prescription options (Chantix, Nicotine Inhaler / Nasal Spray). The patient is  interested in pursuing  prescription tobacco cessation . - Patient accepts  at this time.  -     nicotine (NICODERM CQ - DOSED IN MG/24 HOURS) 14 mg/24hr patch; Apply 1 patch for daily for 2 weeks -     nicotine (NICODERM CQ - DOSED IN MG/24 HR) 7 mg/24hr patch; Apply 1 patch (7 mg patch) daily for 3 weeks and stop  Start this after completing 2 weeks of 14mg  patches   This note has been created with . Any transcriptional errors are unintentional.   Education officer, environmental, NP 04/13/2021, 9:16 AM

## 2021-04-15 ENCOUNTER — Other Ambulatory Visit: Payer: Self-pay

## 2021-04-16 ENCOUNTER — Telehealth: Payer: Self-pay

## 2021-04-16 NOTE — Telephone Encounter (Signed)
At request of Gwinda Passe, NP , call placed to PMR # [phone] to inquire if patient's appointment for 06/04/2021 could be moved up. PCP would like patient to be seen as soon as possible as she needs PT/OT.  Spoke to [place] who stated that the patient has been put on a wait list to call if any cancellations.  She also transferred this CM to April Marshall to inquire about appointment availability. Message left for April with call back requested to this CM # [phone].

## 2021-04-17 ENCOUNTER — Telehealth: Payer: Self-pay | Admitting: Primary Care

## 2021-04-17 NOTE — Telephone Encounter (Signed)
Copied from CRM [id number]. Topic: General - Other >> Apr 17, 2021  9:32 AM Gaetana Michaelis A wrote: Reason for CRM: April with [facility] has called requesting to speak with Durene Cal when possible regarding patient   Please contact further

## 2021-04-20 ENCOUNTER — Inpatient Hospital Stay: Payer: MEDICAID | Admitting: Adult Health

## 2021-04-20 NOTE — Telephone Encounter (Signed)
Call returned to April Marshall/PMR, message left with call back requested to this CM

## 2021-04-21 NOTE — Telephone Encounter (Signed)
Call received from April Marshall/PMR. She was able to schedule patient to be seen this Thursday- 04/23/2021, arriving at 1040.  Call placed to patient and informed her of above appointment and provided her with the address and phone number of the clinic.

## 2021-04-21 NOTE — Telephone Encounter (Signed)
Call placed to April Marshall/PMR, message left with call back requested to this CM.

## 2021-04-23 ENCOUNTER — Encounter: Payer: Self-pay | Attending: Physical Medicine and Rehabilitation | Admitting: Physical Medicine and Rehabilitation

## 2021-05-21 ENCOUNTER — Inpatient Hospital Stay: Payer: MEDICAID | Admitting: Adult Health

## 2021-05-21 NOTE — Progress Notes (Deleted)
[facility] [address] [place] [place] [address] [phone]       HOSPITAL FOLLOW UP NOTE  [name] Date of Birth:  1966-10-03 Medical Record Number:  [id number]   Reason for Referral:  hospital stroke follow up    SUBJECTIVE:   CHIEF COMPLAINT:  No chief complaint on file.   HPI:   [name] is a 55 y.o. female w/pmh of tobacco abuse who presented on 03/10/2021 with left sided weakness for 3 days and seizure activity.  Personally reviewed hospitalization pertinent progress notes, lab work and imaging.  Evaluated by Dr. Pearlean Brownie for right MCA stroke with petechial hemorrhage and cortical edema of unknown etiology possibly in setting of vasospasm with cocaine use vs cardioembolic source.  Repeat CTH stable.  CTA head/neck right ICA<50% stenosis.  EF 70-75%.  LDL 142.  A1c ***.  UDS positive for cocaine.  Recommended 30-day cardiac event monitor to evaluate for A. fib.  Initiated aspirin 81 mg daily and atorvastatin 40 mg daily for secondary stroke prevention.  EEG cortical dysfunction in the right hemisphere maximal right frontal region due to underlying stroke placed on Keppra for seizure prophylaxis.  Elevated BP with no prior diagnosis of HTN, permissive hypertension not indicated and initiated amlodipine and hydrochlorothiazide therapy evaluation completed who recommended CIR for residual left-sided weakness. CIR admin 03/17/2021 -03/24/2021        ROS:   14 system review of systems performed and negative with exception of ***  PMH:  Past Medical History:  Diagnosis Date   Hyperlipidemia 03/11/2021    PSH: No past surgical history on file.  Social History:  Social History   Socioeconomic History   Marital status: Married    Spouse name: Not on file   Number of children: Not on file   Years of education: Not on file   Highest education level: Not on file  Occupational History   Not on file  Tobacco Use   Smoking status:  Unknown    Passive exposure: Current   Smokeless tobacco: Current  Substance and Sexual Activity   Alcohol use: Yes   Drug use: No   Sexual activity: Not on file  Other Topics Concern   Not on file  Social History Narrative   Not on file   Social Determinants of Health   Financial Resource Strain: Not on file  Food Insecurity: Not on file  Transportation Needs: Not on file  Physical Activity: Not on file  Stress: Not on file  Social Connections: Not on file  Intimate Partner Violence: Not on file    Family History: No family history on file.  Medications:   Current Outpatient Medications on File Prior to Visit  Medication Sig Dispense Refill   acetaminophen (TYLENOL) 325 MG tablet Take 2 tablets (650 mg total) by mouth every 6 (six) hours as needed for mild pain (or Fever >/= 101).     amLODipine (NORVASC) 10 MG tablet Take 1 tablet (10 mg total) by mouth daily. 90 tablet 0   aspirin 81 MG chewable tablet Chew 1 tablet (81 mg total) by mouth daily.     atorvastatin (LIPITOR) 40 MG tablet Take 1 tablet (40 mg total) by mouth daily. 90 tablet 1   hydrochlorothiazide (HYDRODIURIL) 25 MG tablet Take 1 tablet (25 mg total) by mouth daily. 90 tablet 0   nicotine (NICODERM CQ - DOSED IN MG/24 HOURS) 14 mg/24hr patch Apply 1 patch for daily for 2 weeks 14  patch 0   nicotine (NICODERM CQ - DOSED IN MG/24 HR) 7 mg/24hr patch Apply 1 patch (7 mg patch) daily for 3 weeks and stop  Start this after completing 2 weeks of 14mg  patches 28 patch 0   No current facility-administered medications on file prior to visit.    Allergies:  No Known Allergies    OBJECTIVE:  Physical Exam  There were no vitals filed for this visit. There is no height or weight on file to calculate BMI. No results found.  Depression screen [facility] 2/9 04/13/2021  Decreased Interest 0  Down, Depressed, Hopeless 0  PHQ - 2 Score 0     General: well developed, well nourished, seated, in no evident  distress Head: head normocephalic and atraumatic.   Neck: supple with no carotid or supraclavicular bruits Cardiovascular: regular rate and rhythm, no murmurs Musculoskeletal: no deformity Skin:  no rash/petichiae Vascular:  Normal pulses all extremities   Neurologic Exam Mental Status: Awake and fully alert. Oriented to place and time. Recent and remote memory intact. Attention span, concentration and fund of knowledge appropriate. Mood and affect appropriate.  Cranial Nerves: Fundoscopic exam reveals sharp disc margins. Pupils equal, briskly reactive to light. Extraocular movements full without nystagmus. Visual fields full to confrontation. Hearing intact. Facial sensation intact. Face, tongue, palate moves normally and symmetrically.  Motor: Normal bulk and tone. Normal strength in all tested extremity muscles Sensory.: intact to touch , pinprick , position and vibratory sensation.  Coordination: Rapid alternating movements normal in all extremities. Finger-to-nose and heel-to-shin performed accurately bilaterally. Gait and Station: Arises from chair without difficulty. Stance is normal. Gait demonstrates normal stride length and balance with ***. Tandem walk and heel toe ***.  Reflexes: 1+ and symmetric. Toes downgoing.     NIHSS  *** Modified Rankin  ***      ASSESSMENT: Brandy Nguyen is a 55 y.o. 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  Vascular risk factors include tobacco use, cocaine use, HLD ***.      PLAN:  Right MCA stroke: Residual deficit: ***.  Complete cardiac event monitor ***. continue aspirin 81 mg daily  and atorvastatin 40 mg daily for secondary stroke prevention.  Discussed secondary stroke prevention measures and importance of close PCP follow up for aggressive stroke risk  factor management. I have gone over the pathophysiology of stroke, warning signs and symptoms, risk factors and their management in some detail with instructions to go to the closest emergency room for symptoms of concern. Seizure with stroke onset: Continue Keppra 500 mg twice daily HTN: BP goal <130/90.  Stable on *** per PCP HLD: LDL goal <70. Recent LDL 142 -continue atorvastatin 40 mg daily.  DMII: A1c goal<7.0. Recent A1c ***.     Follow up in *** or call earlier if needed   CC:  [facility] provider: Dr. 05/10/2021 PCP: Pearlean Brownie, NP    I spent *** minutes of face-to-face and non-face-to-face time with patient.  This included previsit chart review, lab review, study review, order entry, electronic health record documentation, patient education regarding recent stroke, residual deficits, importance of managing stroke risk factors and answered all questions to patient satisfaction     Grayce Sessions, [facility]  [facility] [address] [place], [place]  Phone [phone] Fax [phone] Note: This document was prepared with  digital dictation and possible smart Company secretary. Any transcriptional errors that result from this process are unintentional.

## 2021-05-26 ENCOUNTER — Ambulatory Visit: Payer: Self-pay | Admitting: Cardiovascular Disease

## 2021-06-04 ENCOUNTER — Inpatient Hospital Stay: Payer: Self-pay | Admitting: Physical Medicine and Rehabilitation

## 2021-06-10 NOTE — Progress Notes (Signed)
Monitor was never applied order will be cancelled 

## 2021-07-14 ENCOUNTER — Ambulatory Visit (INDEPENDENT_AMBULATORY_CARE_PROVIDER_SITE_OTHER): Payer: Self-pay | Admitting: Primary Care

## 2021-10-14 ENCOUNTER — Ambulatory Visit (INDEPENDENT_AMBULATORY_CARE_PROVIDER_SITE_OTHER): Payer: Self-pay | Admitting: Primary Care

## 2021-10-14 ENCOUNTER — Telehealth (INDEPENDENT_AMBULATORY_CARE_PROVIDER_SITE_OTHER): Payer: Self-pay | Admitting: Primary Care

## 2021-10-14 NOTE — Telephone Encounter (Signed)
Unable to reach patient at number that was listed it did not belong to her. Called the number that was documented as the number patient called from. It is for [employer] they could not tell me if she was an employee but did say the name did not sound familiar. Attempted to reach patients alternate contact which is her sister; no answer.

## 2021-10-14 NOTE — Telephone Encounter (Signed)
Patient unable to make her 2:50pm appointment for today due to a conflict. Patient [facility] her appointment to 10/30/2021 and would like to know if PCP has any samples that she can come in later to pick up

## 2021-10-30 ENCOUNTER — Ambulatory Visit (INDEPENDENT_AMBULATORY_CARE_PROVIDER_SITE_OTHER): Payer: Self-pay | Admitting: Primary Care

## 2021-12-20 ENCOUNTER — Emergency Department (HOSPITAL_COMMUNITY): Payer: Self-pay

## 2021-12-20 ENCOUNTER — Inpatient Hospital Stay (HOSPITAL_COMMUNITY)
Admission: EM | Admit: 2021-12-20 | Discharge: 2021-12-22 | DRG: 101 | Disposition: A | Discharge status: Home / Self Care | Payer: Self-pay | Attending: Family Medicine | Admitting: Family Medicine

## 2021-12-20 ENCOUNTER — Other Ambulatory Visit: Payer: Self-pay

## 2021-12-20 ENCOUNTER — Encounter (HOSPITAL_COMMUNITY): Payer: Self-pay

## 2021-12-20 DIAGNOSIS — Z9114 Patient's other noncompliance with medication regimen: Secondary | ICD-10-CM

## 2021-12-20 DIAGNOSIS — I63511 Cerebral infarction due to unspecified occlusion or stenosis of right middle cerebral artery: Secondary | ICD-10-CM

## 2021-12-20 DIAGNOSIS — G8384 Todd's paralysis (postepileptic): Secondary | ICD-10-CM | POA: Diagnosis present

## 2021-12-20 DIAGNOSIS — G8194 Hemiplegia, unspecified affecting left nondominant side: Secondary | ICD-10-CM | POA: Diagnosis present

## 2021-12-20 DIAGNOSIS — Z79899 Other long term (current) drug therapy: Secondary | ICD-10-CM

## 2021-12-20 DIAGNOSIS — W19XXXA Unspecified fall, initial encounter: Secondary | ICD-10-CM

## 2021-12-20 DIAGNOSIS — R531 Weakness: Secondary | ICD-10-CM

## 2021-12-20 DIAGNOSIS — Z720 Tobacco use: Secondary | ICD-10-CM

## 2021-12-20 DIAGNOSIS — I10 Essential (primary) hypertension: Secondary | ICD-10-CM

## 2021-12-20 DIAGNOSIS — R451 Restlessness and agitation: Secondary | ICD-10-CM | POA: Diagnosis not present

## 2021-12-20 DIAGNOSIS — D751 Secondary polycythemia: Secondary | ICD-10-CM | POA: Diagnosis present

## 2021-12-20 DIAGNOSIS — Z7151 Drug abuse counseling and surveillance of drug abuser: Secondary | ICD-10-CM

## 2021-12-20 DIAGNOSIS — F1410 Cocaine abuse, uncomplicated: Secondary | ICD-10-CM | POA: Diagnosis present

## 2021-12-20 DIAGNOSIS — R569 Unspecified convulsions: Secondary | ICD-10-CM

## 2021-12-20 DIAGNOSIS — W1830XA Fall on same level, unspecified, initial encounter: Secondary | ICD-10-CM | POA: Diagnosis present

## 2021-12-20 DIAGNOSIS — I69312 Visuospatial deficit and spatial neglect following cerebral infarction: Secondary | ICD-10-CM

## 2021-12-20 DIAGNOSIS — I639 Cerebral infarction, unspecified: Secondary | ICD-10-CM

## 2021-12-20 DIAGNOSIS — R4182 Altered mental status, unspecified: Secondary | ICD-10-CM

## 2021-12-20 DIAGNOSIS — G40101 Localization-related (focal) (partial) symptomatic epilepsy and epileptic syndromes with simple partial seizures, not intractable, with status epilepticus: Principal | ICD-10-CM | POA: Diagnosis present

## 2021-12-20 DIAGNOSIS — Z20822 Contact with and (suspected) exposure to covid-19: Secondary | ICD-10-CM | POA: Diagnosis present

## 2021-12-20 DIAGNOSIS — Z7982 Long term (current) use of aspirin: Secondary | ICD-10-CM

## 2021-12-20 DIAGNOSIS — E7800 Pure hypercholesterolemia, unspecified: Secondary | ICD-10-CM | POA: Diagnosis present

## 2021-12-20 LAB — RAPID URINE DRUG SCREEN, HOSP PERFORMED
Amphetamines: NOT DETECTED
Barbiturates: NOT DETECTED
Benzodiazepines: POSITIVE — AB
Cocaine: POSITIVE — AB
Opiates: NOT DETECTED
Tetrahydrocannabinol: NOT DETECTED

## 2021-12-20 LAB — I-STAT CHEM 8, ED
BUN: 12 mg/dL (ref 6–20)
Calcium, Ion: 1.15 mmol/L (ref 1.15–1.40)
Chloride: 106 mmol/L (ref 98–111)
Creatinine, Ser: 1 mg/dL (ref 0.44–1.00)
Glucose, Bld: 103 mg/dL — ABNORMAL HIGH (ref 70–99)
HCT: 48 % — ABNORMAL HIGH (ref 36.0–46.0)
Hemoglobin: 16.3 g/dL — ABNORMAL HIGH (ref 12.0–15.0)
Potassium: 4.2 mmol/L (ref 3.5–5.1)
Sodium: 139 mmol/L (ref 135–145)
TCO2: 26 mmol/L (ref 22–32)

## 2021-12-20 LAB — CBC
HCT: 45.6 % (ref 36.0–46.0)
Hemoglobin: 15.6 g/dL — ABNORMAL HIGH (ref 12.0–15.0)
MCH: 32.4 pg (ref 26.0–34.0)
MCHC: 34.2 g/dL (ref 30.0–36.0)
MCV: 94.8 fL (ref 80.0–100.0)
Platelets: 285 10*3/uL (ref 150–400)
RBC: 4.81 MIL/uL (ref 3.87–5.11)
RDW: 12.4 % (ref 11.5–15.5)
WBC: 6.8 10*3/uL (ref 4.0–10.5)
nRBC: 0 % (ref 0.0–0.2)

## 2021-12-20 LAB — DIFFERENTIAL
Abs Immature Granulocytes: 0.01 10*3/uL (ref 0.00–0.07)
Basophils Absolute: 0 10*3/uL (ref 0.0–0.1)
Basophils Relative: 0 %
Eosinophils Absolute: 0.2 10*3/uL (ref 0.0–0.5)
Eosinophils Relative: 2 %
Immature Granulocytes: 0 %
Lymphocytes Relative: 19 %
Lymphs Abs: 1.3 10*3/uL (ref 0.7–4.0)
Monocytes Absolute: 0.6 10*3/uL (ref 0.1–1.0)
Monocytes Relative: 9 %
Neutro Abs: 4.7 10*3/uL (ref 1.7–7.7)
Neutrophils Relative %: 70 %

## 2021-12-20 LAB — RESP PANEL BY RT-PCR (FLU A&B, COVID) ARPGX2
Influenza A by PCR: NEGATIVE
Influenza B by PCR: NEGATIVE
SARS Coronavirus 2 by RT PCR: NEGATIVE

## 2021-12-20 LAB — URINALYSIS, ROUTINE W REFLEX MICROSCOPIC
Bilirubin Urine: NEGATIVE
Glucose, UA: NEGATIVE mg/dL
Hgb urine dipstick: NEGATIVE
Ketones, ur: NEGATIVE mg/dL
Leukocytes,Ua: NEGATIVE
Nitrite: NEGATIVE
Protein, ur: NEGATIVE mg/dL
Specific Gravity, Urine: 1.002 — ABNORMAL LOW (ref 1.005–1.030)
pH: 6 (ref 5.0–8.0)

## 2021-12-20 LAB — COMPREHENSIVE METABOLIC PANEL
ALT: 24 U/L (ref 0–44)
AST: 34 U/L (ref 15–41)
Albumin: 3.5 g/dL (ref 3.5–5.0)
Alkaline Phosphatase: 75 U/L (ref 38–126)
Anion gap: 14 (ref 5–15)
BUN: 10 mg/dL (ref 6–20)
CO2: 21 mmol/L — ABNORMAL LOW (ref 22–32)
Calcium: 9.5 mg/dL (ref 8.9–10.3)
Chloride: 104 mmol/L (ref 98–111)
Creatinine, Ser: 1.05 mg/dL — ABNORMAL HIGH (ref 0.44–1.00)
GFR, Estimated: >60 mL/min (ref >60)
Glucose, Bld: 103 mg/dL — ABNORMAL HIGH (ref 70–99)
Potassium: 3.9 mmol/L (ref 3.5–5.1)
Sodium: 139 mmol/L (ref 135–145)
Total Bilirubin: 0.9 mg/dL (ref 0.3–1.2)
Total Protein: 6.5 g/dL (ref 6.5–8.1)

## 2021-12-20 LAB — PROTIME-INR
INR: 0.9 (ref 0.8–1.2)
Prothrombin Time: 12.5 seconds (ref 11.4–15.2)

## 2021-12-20 LAB — I-STAT BETA HCG BLOOD, ED (MC, WL, AP ONLY): I-stat hCG, quantitative: <5 m[IU]/mL (ref <5)

## 2021-12-20 LAB — TROPONIN I (HIGH SENSITIVITY)
Troponin I (High Sensitivity): 7 ng/L (ref <18)
Troponin I (High Sensitivity): 16 ng/L (ref <18)

## 2021-12-20 LAB — ETHANOL: Alcohol, Ethyl (B): <10 mg/dL (ref <10)

## 2021-12-20 LAB — APTT: aPTT: 25 seconds (ref 24–36)

## 2021-12-20 LAB — CBG MONITORING, ED: Glucose-Capillary: 102 mg/dL — ABNORMAL HIGH (ref 70–99)

## 2021-12-20 MED ORDER — LEVETIRACETAM IN NACL 1000 MG/100ML IV SOLN
1000.0000 mg | Freq: Once | INTRAVENOUS | Status: AC
  Administered 2021-12-20: 1000 mg via INTRAVENOUS

## 2021-12-20 MED ORDER — LORAZEPAM 2 MG/ML IJ SOLN: 2.0000 mg | INTRAMUSCULAR | Status: DC | PRN

## 2021-12-20 MED ORDER — LORAZEPAM 2 MG/ML IJ SOLN
2.0000 mg | Freq: Once | INTRAMUSCULAR | Status: AC
Start: 2021-12-20 — End: 2021-12-20
  Filled 2021-12-20: qty 1

## 2021-12-20 MED ORDER — LEVETIRACETAM IN NACL 1000 MG/100ML IV SOLN
1000.0000 mg | Freq: Two times a day (BID) | INTRAVENOUS | Status: DC
  Administered 2021-12-20: 1000 mg via INTRAVENOUS
  Filled 2021-12-20 (×2): qty 100

## 2021-12-20 MED ORDER — ATORVASTATIN CALCIUM 40 MG PO TABS
40.0000 mg | ORAL_TABLET | Freq: Every day | ORAL | Status: DC
  Administered 2021-12-21 – 2021-12-22 (×2): 40 mg via ORAL
  Filled 2021-12-20 (×2): qty 1

## 2021-12-20 MED ORDER — LORAZEPAM 2 MG/ML IJ SOLN
INTRAMUSCULAR | Status: AC
  Administered 2021-12-20: 2 mg via INTRAVENOUS
  Filled 2021-12-20: qty 1

## 2021-12-20 MED ORDER — LEVETIRACETAM IN NACL 1000 MG/100ML IV SOLN: 1000.0000 mg | Freq: Two times a day (BID) | INTRAVENOUS | Status: DC

## 2021-12-20 MED ORDER — ENOXAPARIN SODIUM 40 MG/0.4ML IJ SOSY
40.0000 mg | PREFILLED_SYRINGE | INTRAMUSCULAR | Status: DC
  Administered 2021-12-21 – 2021-12-22 (×2): 40 mg via SUBCUTANEOUS
  Filled 2021-12-20 (×2): qty 0.4

## 2021-12-20 MED ORDER — SODIUM CHLORIDE 0.9 % IV SOLN: 2000.0000 mg | Freq: Once | INTRAVENOUS | Status: DC

## 2021-12-20 MED ORDER — AMLODIPINE BESYLATE 5 MG PO TABS
10.0000 mg | ORAL_TABLET | Freq: Every day | ORAL | Status: DC
  Filled 2021-12-20: qty 2

## 2021-12-20 MED ORDER — ASPIRIN 81 MG PO CHEW
81.0000 mg | CHEWABLE_TABLET | Freq: Every day | ORAL | Status: DC
  Administered 2021-12-21 – 2021-12-22 (×2): 81 mg via ORAL
  Filled 2021-12-20 (×2): qty 1

## 2021-12-20 MED ORDER — HYDROCHLOROTHIAZIDE 25 MG PO TABS
25.0000 mg | ORAL_TABLET | Freq: Every day | ORAL | Status: DC
  Administered 2021-12-21 – 2021-12-22 (×2): 25 mg via ORAL
  Filled 2021-12-20 (×2): qty 1

## 2021-12-20 NOTE — ED Triage Notes (Addendum)
Pt arrived via [facility] for two falls and [facility]MS. Pt didn't hit head. LKW 1030. Pt states felt dizzy on bus this morning then went and sat on bench. Per EMS, pt has twitching on left arm and left leg. EMS gave midazolam 2.5mg , which they noticed decreased the twitching on the left side, but didn't take it completely away.  ?

## 2021-12-20 NOTE — ED Notes (Signed)
Trauma Response Nurse Documentation ? ? ?Brandy Nguyen is a 56 y.o. female arriving to [facility] ED via EMS ? ?On No antithrombotic. Trauma was activated as a Level 2 by CRN based on the following trauma criteria Elderly patients > 65 with head trauma on anti-coagulation (excluding ASA). Trauma team at the bedside on patient arrival. Patient cleared for CT by Dr. Sherry Ruffing. Patient to CT with team. GCS 15. ? ?Patient arrived to room A&Ox4, GCS 15. Reports she does not take any blood thinners and did not hit her head. Felt fine this AM, around 1030AM she went shopping and began feeling "funny" She describes this as an anxious feeling and noticed she was having some left sided twitching. She also says she began feeling dizzy. On arrival she still had some left sided twitching and rigidity through her upper and lower extremity. Received 2mg  versed enroute and a total of 4mg  ativan after arrival. ? ?History  ? Past Medical History:  ?Diagnosis Date  ? Hyperlipidemia 03/11/2021  ?  ? No past surgical history on file.  ? ?Initial Focused Assessment (If applicable, or please see trauma documentation): ?A&Ox4, GCS 15. Rigid through left side and some twitching to fingers and toes. Reports history of old MCA stroke with some deficits including vision. Normally 5/5 strength in all extremities. Reports admission July 22 for 7 days for a seizure, was not discharged with seizure medication per patient. ? ?CT's Completed:   ?CT Head  ? ?Interventions:  ?IV, labs ?CBG 102 ?Urine specimen ?CT Head ? ?Consults completed:  ?Neurology - code stroke activated and then discontinued. Work-up to include CT head and seizure load. ? ?Event Summary: ?Handed off to stroke team and Rapid Response RN Saralyn Pilar ? ?Bedside handoff with ED RN Alana.   ? ?Park Pope Luan Maberry  ?Trauma Response RN ? ?Please call TRN at [phone] for further assistance. ?  ?

## 2021-12-20 NOTE — Procedures (Signed)
Routine EEG Report ? ?Brandy Nguyen is a 56 y.o. female with a history of spell who is undergoing an EEG to evaluate for seizures. ? ?Report: This EEG was acquired with electrodes placed according to the International 10-20 electrode system (including Fp1, Fp2, F3, F4, C3, C4, P3, P4, O1, O2, T3, T4, T5, T6, A1, A2, Fz, Cz, Pz). The following electrodes were missing or displaced: none. ? ?The best background was 9 Hz with overriding beta frequencies. This activity is reactive to stimulation. Sleep was identified by K complexes and sleep spindles. There was no focal slowing. There were no interictal epileptiform discharges. There were no electrographic seizures identified. Photic stimulation and hyperventilation were not performed. ? ?Impression: This EEG was obtained while asleep and is normal. Beta frequencies are secondary to medication effect. ?   ?Clinical Correlation: Normal EEGs, however, do not rule out epilepsy. ? ?Bing Neighbors, MD ?[facility] ?[phone] ? ?If 7pm- 7am, please page neurology on call as listed in AMION. ? ?

## 2021-12-20 NOTE — Hospital Course (Addendum)
Brandy Nguyen is a 56 y.o.female with a history of R MCA CVA, HLD, HTN, polycythemia, cocaine abuse, tobacco use, seizures who was admitted to the [facility] Teaching Service at [facility] for Todd's paralysis. Her hospital course is detailed below: ? ?Todd's Paralysis ?Patient was admitted for seizure-like activity of the left-sided extremities.  Received midazolam, Ativan, Keppra 2 g.  CT head without acute intracranial abnormalities.  MRI showed no evidence of acute intracranial abnormalities.  EEG unremarkable with the exception of secondary beta frequencies to medication effects.  Further work-up regarding lab work unremarkable with the exception of UDS being cocaine positive (additionally benzo positive however this was taken after receiving benzos during episode).  Patient was started on Keppra however during hospitalization experienced psychosis with aggressive behavior.  Unable to correlate nature of event however may have been related to Keppra despite patient tolerating it in the past.  Event may have also been related to prior cocaine use or alcohol.  Psychosis resolved by day 3 (the next morning) of hospitalization.  Neurology advised transition to Depakote and patient was discharged with Depakote 500 mg twice daily and advised by PT/OT for home health regarding difficulty balancing due to past stroke.  Seizure precautions were discussed by neurology especially not driving. ? ?HTN  HLD ?Patient was provided several medications on discharge such as HCTZ and atorvastatin.  Amlodipine was held during hospitalization due to lower blood pressures.   ? ?PCP Follow-up Recommendations: ?Restart amlodipine if necessary ?Patient interested in cessation of alcohol and smoking, would benefit from further conversation and medications as appropriate.  Keep in mind patient has had seizures and therefore not advised to have run to clean or future given a decrease seizure threshold ? ?Seizure precautions: ?Per [facility] statutes, patients with seizures are not allowed to drive until they have been seizure-free for six months and cleared by a physician  ?  ?Use caution when using heavy equipment or power tools. Avoid working on ladders or at heights. Take showers instead of baths. Ensure the water temperature is not too high on the home water heater. Do not go swimming alone. Do not lock yourself in a room alone (i.e. bathroom). When caring for infants or small children, sit down when holding, feeding, or changing them to minimize risk of injury to the child in the event you have a seizure. Maintain good sleep hygiene. Avoid alcohol.  ?  ?If patient has another seizure, call 911 and bring them back to the ED if: ?A.  The seizure lasts longer than 5 minutes.      ?B.  The patient doesn't wake shortly after the seizure or has new problems such as difficulty seeing, speaking or moving following the seizure ?C.  The patient was injured during the seizure ?D.  The patient has a temperature over 102 F (39C) ?E.  The patient vomited during the seizure and now is having trouble breathing ?   ?During the Seizure ?- First, ensure adequate ventilation and place patients on the floor on their left side  ?Loosen clothing around the neck and ensure the airway is patent. If the patient is clenching the teeth, do not force the mouth open with any object as this can cause severe damage ?- Remove all items from the surrounding that can be hazardous. The patient may be oblivious to what's happening and may not even know what he or she is doing. ?If the patient is confused and wandering, either gently guide him/her away  and block access to outside areas ?- Reassure the individual and be comforting ?- Call 911. In most cases, the seizure ends before EMS arrives. However, there are cases when seizures may last over 3 to 5 minutes. Or the individual may have developed breathing difficulties or severe injuries. If a pregnant patient or a  person with diabetes develops a seizure, it is prudent to call an ambulance. ?- Finally, if the patient does not regain full consciousness, then call EMS. Most patients will remain confused for about 45 to 90 minutes after a seizure, so you must use judgment in calling for help. ?  ?  ?After the Seizure (Postictal Stage) ?After a seizure, most patients experience confusion, fatigue, muscle pain and/or a headache. Thus, one should permit the individual to sleep. For the next few days, reassurance is essential. Being calm and helping reorient the person is also of importance. ?  ?Most seizures are painless and end spontaneously. Seizures are not harmful to others but can lead to complications such as stress on the lungs, brain and the heart. Individuals with prior lung problems may develop labored breathing and respiratory distress.  ? ?

## 2021-12-20 NOTE — Discharge Instructions (Addendum)
Dear Brandy Nguyen,  ? ?Thank you for letting us participate in your care! In this section, you will find a brief hospital admission summary of why you were admitted to the hospital, what happened during your admission, your diagnosis/diagnoses, and recommended follow up.  ?You were admitted because you were experiencing a seizure. You received antiepileptic medications. Your imaging was unremarkable for new findings. Neurology was consulted and advised you start Depakote 500mg  twice daily and follow up with them in 3 months outpatient.  Below you will find neurology recommendations for those with new seizures. ?You have also been given your other prescriptions during your visit to go home with. ? ? ?POST-HOSPITAL & CARE INSTRUCTIONS ?Follow up with neurology in 3 months ?Please let PCP/Specialists know of any changes in medications that were made.  ?Please see medications section of this packet for any medication changes.  ? ?DOCTOR'S APPOINTMENTS & FOLLOW UP ?Future Appointments  ?Date Time Provider Department Center  ?01/04/2022  9:30 AM 03/06/2022, NP [facility] None  ? ? ? ?Thank you for choosing [facility]! Take care and be well! ? ?Family Medicine Teaching Service Inpatient Team ?[facility]  ?[facility]  ?[address] [employer], [place] ?[phone] ? ? ?Seizure precautions: ?Per [facility] statutes, patients with seizures are not allowed to drive until they have been seizure-free for six months and cleared by a physician  ?  ?Use caution when using heavy equipment or power tools. Avoid working on ladders or at heights. Take showers instead of baths. Ensure the water temperature is not too high on the home water heater. Do not go swimming alone. Do not lock yourself in a room alone (i.e. bathroom). When caring for infants or small children, sit down when holding, feeding, or changing them to minimize risk of injury to the child in the event you have a  seizure. Maintain good sleep hygiene. Avoid alcohol.  ?  ?If patient has another seizure, call 911 and bring them back to the ED if: ?A.  The seizure lasts longer than 5 minutes.      ?B.  The patient doesn't wake shortly after the seizure or has new problems such as difficulty seeing, speaking or moving following the seizure ?C.  The patient was injured during the seizure ?D.  The patient has a temperature over 102 F (39C) ?E.  The patient vomited during the seizure and now is having trouble breathing ?   ?During the Seizure ?- First, ensure adequate ventilation and place patients on the floor on their left side  ?Loosen clothing around the neck and ensure the airway is patent. If the patient is clenching the teeth, do not force the mouth open with any object as this can cause severe damage ?- Remove all items from the surrounding that can be hazardous. The patient may be oblivious to what's happening and may not even know what he or she is doing. ?If the patient is confused and wandering, either gently guide him/her away and block access to outside areas ?- Reassure the individual and be comforting ?- Call 911. In most cases, the seizure ends before EMS arrives. However, there are cases when seizures may last over 3 to 5 minutes. Or the individual may have developed breathing difficulties or severe injuries. If a pregnant patient or a person with diabetes develops a seizure, it is prudent to call an ambulance. ?- Finally, if the patient does not regain full consciousness,  then call EMS. Most patients will remain confused for about 45 to 90 minutes after a seizure, so you must use judgment in calling for help. ?  ?  ?After the Seizure (Postictal Stage) ?After a seizure, most patients experience confusion, fatigue, muscle pain and/or a headache. Thus, one should permit the individual to sleep. For the next few days, reassurance is essential. Being calm and helping reorient the person is also of importance. ?  ?Most  seizures are painless and end spontaneously. Seizures are not harmful to others but can lead to complications such as stress on the lungs, brain and the heart. Individuals with prior lung problems may develop labored breathing and respiratory distress.  ? ?

## 2021-12-20 NOTE — Progress Notes (Signed)
EEG complete - results pending 

## 2021-12-20 NOTE — Plan of Care (Signed)

## 2021-12-20 NOTE — Consult Note (Signed)
Neurology Consultation ? ?Reason for Consult: Code Stroke ?Referring Physician: Dr. Sherry Ruffing ? ?CC: Left-sided weakness, falls, "nervousness" displayed as constant twitching of the left upper and lower extremities ? ?History is obtained from: Patient, Chart review ? ?HPI: Brandy Nguyen is a 56 y.o. female with a medical history significant for hyperlipidemia and right posterior MCA territory infarct in June 2022 with cortical edema and extensive petechial hemorrhage with seizures at onset initially on Keppra but tapered off after hospitalization who presented to the ED on 3/19 initially as a trauma for 2 falls while shopping this morning at 10:30AM.  The patient did not hit her head during these falls or lose consciousness.  Patient states the person that she noticed was that she was unable to get her jacket off and then noticed that she had some "nervousness" described as left arm twitching.  On arrival to the ED, the ED provider noticed that the patient had left upper and lower extremity jerking concerning for focal status and the patient was given Ativan with some improvement in the jerking.  Patient had significant left upper and lower extremity weakness on exam and a code stroke was activated.  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  Patient was given another 2 mg of Ativan with improvement of left upper and lower extremity weakness. ? ?LKW: 10:30 AM today ?TNK given?: no, presentation is felt to be most consistent with focal status with Todd's paralysis rather than acute ischemia.  ?IR Thrombectomy? No, presentation is not consistent with an LVO. Presentation is more consistent with focal status with Todd's paralysis. MRI negative for acute ischemia. ?Modified Rankin Scale: 1-No significant post stroke disability and can perform usual duties with stroke symptoms ? ?ROS: A complete ROS was performed  and is negative except as noted in the HPI.  ? ?Past Medical History:  ?Diagnosis Date  ? Hyperlipidemia 03/11/2021  ?No past surgical history on file. ? ?No family history on file. ? ?Social History:  ? reports that she has an unknown smoking status. She has been exposed to tobacco smoke. She uses smokeless tobacco. She reports current alcohol use. She reports that she does not use drugs. ? ?Medications ? ?Current Facility-Administered Medications:  ?  levETIRAcetam (KEPPRA) IVPB 1000 mg/100 mL premix, 1,000 mg, Intravenous, Once **FOLLOWED BY** levETIRAcetam (KEPPRA) IVPB 1000 mg/100 mL premix, 1,000 mg, Intravenous, Once, Tegeler, Gwenyth Allegra, MD ?  levETIRAcetam (KEPPRA) IVPB 1000 mg/100 mL premix, 1,000 mg, Intravenous, Q12H, Tegeler, Gwenyth Allegra, MD ?  LORazepam (ATIVAN) 2 MG/ML injection, , , ,  ?  LORazepam (ATIVAN) injection 2 mg, 2 mg, Intravenous, Once, Tegeler, Gwenyth Allegra, MD ? ?Current Outpatient Medications:  ?  acetaminophen (TYLENOL) 325 MG tablet, Take 2 tablets (650 mg total) by mouth every 6 (six) hours as needed for mild pain (or Fever >/= 101)., Disp: , Rfl:  ?  amLODipine (NORVASC) 10 MG tablet, Take 1 tablet (10 mg total) by mouth daily., Disp: 90 tablet, Rfl: 0 ?  aspirin 81 MG chewable tablet, Chew 1 tablet (81 mg total) by mouth daily., Disp: , Rfl:  ?  atorvastatin (LIPITOR) 40 MG tablet, Take 1 tablet (40 mg total) by mouth daily., Disp: 90 tablet, Rfl: 1 ?  hydrochlorothiazide (HYDRODIURIL) 25 MG tablet, Take 1 tablet (25 mg total) by mouth daily., Disp: 90 tablet, Rfl: 0 ?  nicotine (NICODERM CQ - DOSED IN MG/24 HOURS) 14 mg/24hr patch,  Apply 1 patch for daily for 2 weeks, Disp: 14 patch, Rfl: 0 ?  nicotine (NICODERM CQ - DOSED IN MG/24 HR) 7 mg/24hr patch, Apply 1 patch (7 mg patch) daily for 3 weeks and stop  Start this after completing 2 weeks of 14mg  patches, Disp: 28 patch, Rfl: 0 ? ?Exam: ?Current vital signs: ?BP (!) 199/125   Pulse 84   Temp 98.7 ?F (37.1 ?C) (Oral)    Resp 13   Ht 4\' 11"  (1.499 m)   Wt 65.8 kg   SpO2 96%   BMI 29.29 kg/m?  ?Vital signs in last 24 hours: ?Temp:  [98.7 ?F (37.1 ?C)] 98.7 ?F (37.1 ?C) (03/19 1221) ?Pulse Rate:  [84] 84 (03/19 1221) ?Resp:  [13] 13 (03/19 1221) ?BP: (199)/(125) 199/125 (03/19 1221) ?SpO2:  [96 %] 96 % (03/19 1221) ?Weight:  [65.8 kg] 65.8 kg (03/19 1214) ? ?GENERAL: Awake, alert, in no acute distress ?Psych: Affect is flat, patient is calm and cooperative with examination ?Head: Normocephalic and atraumatic, without obvious abnormality ?EENT: Normal conjunctivae, dry mucous membranes, no OP obstruction ?LUNGS: Normal respiratory effort. Non-labored breathing on room air ?CV: Regular rate and rhythm on telemetry ?ABDOMEN: Soft, non-tender, non-distended ?Extremities: Warm, well perfused, without obvious deformity ? ?NEURO:  ?Mental Status: Awake, alert, and oriented to person, place, time, and situation. ?She is able to provide a clear and coherent history of present illness. ?Speech/Language: speech is fluent without dysarthria.   ?No aphasia or neglect is noted ?Cranial Nerves:  ?II: PERRL. Left hemianopia noted.  ?III, IV, VI: EOMI without ptosis, gaze preference, or nystagmus. ?V: Sensation is intact to light touch and symmetrical to face.  ?VII: Face is symmetric resting and smiling. ?VIII: Hearing is intact to voice ?IX, X: Palate elevation is symmetric. Phonation normal.  ?XI: Normal sternocleidomastoid and trapezius muscle strength ?XII: Tongue protrudes midline without fasciculations.   ?Motor: Patient is able to elevate the RUE antigravity without vertical drift. Patient's left upper extremity is weak and drifts to bed on assessment.  Patient's left lower extremity does not move against gravity.  ?Right lower extremity drifts to bed.  ?Patient does have left upper and lower extremity twitching on examination (noted to be improved after first dose of lorazepam) ?Sensation: Initially, sensation to the left lower extremity  is absent on assessment, on reassessment patient reports feeling something "sharp" in her leg. Decreased sensation to light touch reported to the LUE.  ?Gait: Deferred ? ?NIHSS: ?1a Level of Conscious.: 0 ?1b LOC Questions: 0 ?1c LOC Commands: 0 ?2 Best Gaze: 0 ?3 Visual: 2 ?4 Facial Palsy: 0 ?5a Motor Arm - left: 2 ?5b Motor Arm - Right: 0 ?6a Motor Leg - Left: 3 ?6b Motor Leg - Right: 2 ?7 Limb Ataxia: 0 ?8 Sensory: 2 ?9 Best Language: 0 ?10 Dysarthria: 0 ?11 Extinct. and Inatten.: 0 ?TOTAL: 11 ? ?Labs ?I have reviewed labs in epic and the results pertinent to this consultation are: ?CBC ?   ?Component Value Date/Time  ? WBC 6.8 12/20/2021 1220  ? RBC 4.81 12/20/2021 1220  ? HGB 16.3 (H) 12/20/2021 1230  ? HCT 48.0 (H) 12/20/2021 1230  ? PLT 285 12/20/2021 1220  ? MCV 94.8 12/20/2021 1220  ? MCH 32.4 12/20/2021 1220  ? MCHC 34.2 12/20/2021 1220  ? RDW 12.4 12/20/2021 1220  ? LYMPHSABS 1.3 12/20/2021 1220  ? MONOABS 0.6 12/20/2021 1220  ? EOSABS 0.2 12/20/2021 1220  ? BASOSABS 0.0 12/20/2021 1220  ? ?CMP  ?   ?  Component Value Date/Time  ? NA 139 12/20/2021 1230  ? K 4.2 12/20/2021 1230  ? CL 106 12/20/2021 1230  ? CO2 21 (L) 12/20/2021 1220  ? GLUCOSE 103 (H) 12/20/2021 1230  ? BUN 12 12/20/2021 1230  ? CREATININE 1.00 12/20/2021 1230  ? CALCIUM 9.5 12/20/2021 1220  ? PROT 6.5 12/20/2021 1220  ? ALBUMIN 3.5 12/20/2021 1220  ? AST 34 12/20/2021 1220  ? ALT 24 12/20/2021 1220  ? ALKPHOS 75 12/20/2021 1220  ? BILITOT 0.9 12/20/2021 1220  ? GFRNONAA >60 12/20/2021 1220  ? ?Lipid Panel  ?   ?Component Value Date/Time  ? CHOL 234 (H) 03/11/2021 0500  ? TRIG 56 03/11/2021 0500  ? HDL 81 03/11/2021 0500  ? CHOLHDL 2.9 03/11/2021 0500  ? VLDL 11 03/11/2021 0500  ? LDLCALC 142 (H) 03/11/2021 0500  ? ?Lab Results  ?Component Value Date  ? HGBA1C 5.3 03/11/2021  ? ?Imaging ?I have reviewed the images obtained: ? ?CT-scan of the brain 3/19: ?1. Chronic moderate size right MCA infarct. No acute intracranial abnormalities are  identified. ?2. Sinus disease as above. ? ?MRI examination of the brain 3/19: ?1. No evidence of acute intracranial abnormality. ?2. Remote right posterior MCA territory infarct.  ? ?Assessment: 56 y.o. female wit

## 2021-12-20 NOTE — Progress Notes (Addendum)
FPTS Brief Progress Note ? ?S: Night rounded on patient.  She is still sleeping.  Nurse has no concerns at this time. ? ? ?O: ?BP (!) 148/96 (BP Location: Right Arm)   Pulse 62   Temp 97.6 ?F (36.4 ?C) (Axillary)   Resp 20   Ht 4\' 11"  (1.499 m)   Wt 65.8 kg   SpO2 99%   BMI 29.29 kg/m?   ? ? ?A/P: ?Continue plan per day team ?Patient is still sleeping on evaluation.  Nurse reports that if she wakes up she will do a bedside swallow but will remain n.p.o. at this time. ? ? , MD ?12/20/2021, 10:29 PM ?PGY-3, [facility] Night Resident  ?Please page [phone] with questions.  ? ? ?

## 2021-12-20 NOTE — Progress Notes (Signed)
Orthopedic Tech Progress Note ?Patient Details:  ?Brandy Nguyen ?[birth year] ?[id number] ? ?Level 2 trauma downgraded to non-trauma ? ?Patient ID: Brandy Nguyen, female   DOB: Jun 15, 1966, 56 y.o.   MRN: [id number] ? ?Brandy Nguyen ?12/20/2021, 12:19 PM ? ?

## 2021-12-20 NOTE — ED Notes (Signed)
Patient lab results was given to Nurse. ?

## 2021-12-20 NOTE — Progress Notes (Signed)
Chaplain responded to Trauma L2, pt not available and no family present.  Later downgraded to non-trauma event.  ? ?Please contact as needed for support if needed. ? ?Belia Heman, Chaplain ?Pager: [phone] ? ? ? 12/20/21 1200  ?Clinical Encounter Type  ?Visited With Patient not available  ?Visit Type Initial;Trauma  ?Referral From Nurse  ?Stress Factors  ?Patient Stress Factors Health changes  ? ? ?

## 2021-12-20 NOTE — Progress Notes (Signed)
VAST consult placed for PIV access. One 18G PIV is already documented at this time. Per Alana RN, the CTA order was d/c. No need for an additional PIV is needed at this time. Re-consult VAST team for further needs.  ?

## 2021-12-20 NOTE — H&P (Addendum)
Family Medicine Teaching Service ?Hospital Admission History and Physical ?Service Pager: [phone] ? ?Patient name: Brandy Nguyen Medical record number: [id number] ?Date of birth: 06/29/1966 Age: 56 y.o. Gender: female ? ?Primary Care Provider: Kerin Perna, NP ?Consultants: Neurology ?Code Status: Full - not confirmed by patient or family due to level 5 caveat.  ?Preferred Emergency Contact:   ?Contact Information   ? ? Name Relation Home Work Mobile  ? Nguyen,Brandy Sister [phone]    ? Brandy Nguyen      ? ?  ? ?Chief Complaint: constant twitching of left-sided extremities ? ?Assessment and Plan: ?Brandy Nguyen is a 56 y.o. female presenting with left-sided weakness and constant twitching of left sided extremities. PMH is significant for R MCA CVA, HLD, HTN, cocaine abuse. ? ?Todd's Paralysis  Status Epilepticus  ?S/p midazolam 2.5mg , ativan 2mg , keppra 2g. CT head without acute intracranial abnormalities. MR brain showed no evidence of acute intracranial abnormality. Remote right posterior MCA territory infarct. EEG is normal. Beta frequencies are secondary to medication effect. BMP without electrolyte abnormalities and glucose normal at 103. Troponins neg. Alcohol <10. UDS benzos and cocaine +, benzos likely secondary to ativan/midazolam prior to collecting. UA unremarkable, not infectious appearing. Neurology consulted and no further recommendations at this time. Pt may benefit from anti-seizure medication prior to discharge. Will need to obtain code status and HPI information when patient is more awake given she is level 5 caveat at this time. ?-admitted to [facility], attending Dr. Ardelia Mems, progressive unit ?-Neurology following, appreciate recommendations ?-Continuous cardiac monitoring x24 hrs ?-vital signs per unit ?-Neuro checks every 4 hours ?-Seizure and fall precautions ?-SLP for swallow eval ?-PT/OT eval and treat ?-IV keppra, per neurology ?-Ativan 2mg  PRN for seizure activity lasting >5  min and notify neurology ?-AM BMP and CBC ?-Consider echo if suspecting cardiac etiology ? ?Hx of R MCA CVA  HLD ?CT head and MRI showed chronic moderate size right MCA infarct. Home medications include atorvastatin 40mg  daily and ASA 81mg  daily. Last lipid panel on 03/11/21 with elevated LDL 142.  ?-lipid panel ?-Continue atorvastatin 40mg  daily ? ?HTN ?Home medications include amlodipine 10mg  daily and HCTZ 25 mg daily. BP on admission 197/109. Better controlled at 138/91 now.  ?-Continue amlodipine 10mg  daily ?-continue HCTZ 25mg  daily ? ?Polycythemia  ?Chronic. Hgb 15.6 on admission. Suspect secondary to smoking history or dehydration.  ? ?Cocaine Abuse  ?UDS positive for cocaine. Benzos given by EMS and ED provider for seizure.  ?- TOC consult for substance use cessation and community resources  ? ?Tobacco Use Disorder ?- Nicotine patch available upon request  ? ? ?FEN/GI: NPO until SLP bedside swallow eval ?Prophylaxis: Lovenox ? ?Disposition: Progressive ? ?History of Present Illness:  Brandy Nguyen is a 56 y.o. female presenting with seizure like activity. ? ?History limited by mental status change. History provided by ED physician as also unable to reach family member's in chart. Pt has right-sided stroke with reported left visual deficits at baseline, seizures but not on antiseizure medication, and cocaine abuse. Pt was brought in as level 2 trauma for confusion and AMS for consideration of possible head injury. She denies hitting her head and denied any headaches. Without n/v or vision changes. Patient had subtle twitching of left-sided extremities concerning for focal neurological deficits and was given Ativan in the ED with code stroke activation. Neurology was consulted and pt received Keppra load of 2g and started on IV Keppra.  ? ?Review Of Systems: Per HPI with the  following additions:  ? ?Review of Systems  ?Unable to perform ROS: Mental status change   ? ?Patient Active Problem List  ? Diagnosis  Date Noted  ? Right middle cerebral artery stroke ([place]) 03/17/2021  ? Dysarthria 03/11/2021  ? 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 Pure hypercholesterolemia 03/11/2021  ? Polycythemia 03/11/2021  ? Leukocytosis 03/11/2021  ? Dehydration 03/11/2021  ? Cerebral edema ([place]) 03/11/2021  ? CVA (cerebral vascular accident) ([place]) 03/10/2021  ? Seizure ([place])   ? Cocaine abuse ([place])   ? Tobacco abuse   ? Left leg weakness   ? Left-sided neglect   ? ? ?Past Medical History: ?Past Medical History:  ?Diagnosis Date  ? Hyperlipidemia 03/11/2021  ? ? ?Past Surgical History: ?History reviewed. No pertinent surgical history. ? ?Social History: ?Social History  ? ?Tobacco Use  ? Smoking status: Unknown  ?  Passive exposure: Current  ? Smokeless tobacco: Current  ?Substance Use Topics  ? Alcohol use: Yes  ? Drug use: Yes  ?  Types: Cocaine  ? ? ?Family History: ?No family history on file. ? ? ?Allergies and Medications: ?No Known Allergies ?No current facility-administered medications on file prior to encounter.  ? ?Current Outpatient Medications on File Prior to Encounter  ?Medication Sig Dispense Refill  ? acetaminophen (TYLENOL) 325 MG tablet Take 2 tablets (650 mg total) by mouth every 6 (six) hours as needed for mild pain (or Fever >/= 101).    ? amLODipine (NORVASC) 10 MG tablet Take 1 tablet (10 mg total) by mouth daily. 90 tablet 0  ? aspirin 81 MG chewable tablet Chew 1 tablet (81 mg total) by mouth daily.    ? atorvastatin (LIPITOR) 40 MG tablet Take 1 tablet (40 mg total) by mouth daily. 90 tablet 1  ? hydrochlorothiazide (HYDRODIURIL) 25 MG tablet Take 1 tablet (25 mg total) by mouth daily. 90 tablet 0  ? nicotine (NICODERM CQ - DOSED IN MG/24 HOURS) 14 mg/24hr patch Apply 1 patch for daily for 2 weeks 14 patch 0  ? nicotine (NICODERM CQ - DOSED IN MG/24 HR) 7 mg/24hr patch Apply 1 patch (7 mg patch) daily for 3 weeks and stop ? ?Start this after completing 2 weeks of 14mg  patches 28 patch 0  ? ? ?Objective: ?BP 121/82    Pulse (!) 58   Temp 98.7 ?F (37.1 ?C) (Oral)   Resp 17   Ht 4\' 11"  (1.499 m)   Wt 65.8 kg   SpO2 98%   BMI 29.29 kg/m?  ?Exam: ?General: minimally arousable to voice, snoring loudly ?HEENT: PERRL ?Neck: Supple ?Chest: CTAB, normal WOB. Good air movement bilaterally. Transmitted upper airway noise from snoring ?Heart: RRR, no murmur appreciated ?Abdomen: Soft, non-tender, non-distended. Normoactive bowel sounds.  ?Extremities: moves extremities in reaction painful stimuli at nail bed ?MSK: Normal bulk and tone ?Neuro: Unable to assess ?Skin: No rashes or lesions appreciated.  ? ?Labs and Imaging: ?CBC BMET  ?Recent Labs  ?Lab 12/20/21 ?1220 12/20/21 ?1230  ?WBC 6.8  --   ?HGB 15.6* 16.3*  ?HCT 45.6 48.0*  ?PLT 285  --   ? Recent Labs  ?Lab 12/20/21 ?1220 12/20/21 ?1230  ?NA 139 139  ?K 3.9 4.2  ?CL 104 106  ?CO2 21*  --   ?BUN 10 12  ?CREATININE 1.05* 1.00  ?GLUCOSE 103* 103*  ?CALCIUM 9.5  --   ?  ? ?EKG: sinus bradycardia @59 , diffuse T inversions ? ?CT HEAD WO CONTRAST (5MM) ? ?Result Date: 12/20/2021 ?CLINICAL  DATA:  Neuro deficit.  Stroke suspected. EXAM: CT HEAD WITHOUT CONTRAST TECHNIQUE: Contiguous axial images were obtained from the base of the skull through the vertex without intravenous contrast. RADIATION DOSE REDUCTION: This exam was performed according to the departmental dose-optimization program which includes automated exposure control, adjustment of the mA and/or kV according to patient size and/or use of iterative reconstruction technique. COMPARISON:  March 11, 2021 FINDINGS: Brain: No subdural, epidural, or subarachnoid hemorrhage. Ventricles and sulci are nondilated. Mild increased prominence of the right lateral ventricle is consistent with ex vacuo effect due to the right MCA chronic infarct. Cerebellum, basal cisterns, and brainstem are normal. The moderate sized right MCA distribution infarct demonstrates increased encephalomalacia since the previous study. No acute infarct or ischemia is  noted. No mass effect or midline shift. Vascular: No hyperdense vessel or unexpected calcification. Skull: Normal. Negative for fracture or focal lesion. Sinuses/Orbits: Mild mucosal thickening in scattered para

## 2021-12-20 NOTE — ED Provider Notes (Signed)
?[facility] ?Provider Note ? ? ?CSN: [id number] ?Arrival date & time: 12/20/21  1211 ? ?  ? ?History ? ?Chief Complaint  ?Patient presents with  ? Altered Mental Status  ? ? ?Brandy Nguyen is a 56 y.o. female. ? ?The history is provided by the patient and medical records. No language interpreter was used.  ?Neurologic Problem ?This is a new problem. The current episode started 1 to 2 hours ago. The problem occurs constantly. The problem has not changed since onset.Pertinent negatives include no chest pain, no abdominal pain, no headaches and no shortness of breath. Nothing aggravates the symptoms. Nothing relieves the symptoms. She has tried nothing for the symptoms. The treatment provided no relief.  ? ?  ? ?Home Medications ?Prior to Admission medications   ?Medication Sig Start Date End Date Taking? Authorizing Provider  ?acetaminophen (TYLENOL) 325 MG tablet Take 2 tablets (650 mg total) by mouth every 6 (six) hours as needed for mild pain (or Fever >/= 101). 03/17/21   Autry-Lott, Naaman Plummer, DO  ?amLODipine (NORVASC) 10 MG tablet Take 1 tablet (10 mg total) by mouth daily. 04/13/21   Kerin Perna, NP  ?aspirin 81 MG chewable tablet Chew 1 tablet (81 mg total) by mouth daily. 03/17/21   Autry-Lott, Naaman Plummer, DO  ?atorvastatin (LIPITOR) 40 MG tablet Take 1 tablet (40 mg total) by mouth daily. 04/13/21   Kerin Perna, NP  ?hydrochlorothiazide (HYDRODIURIL) 25 MG tablet Take 1 tablet (25 mg total) by mouth daily. 04/13/21   Kerin Perna, NP  ?nicotine (NICODERM CQ - DOSED IN MG/24 HOURS) 14 mg/24hr patch Apply 1 patch for daily for 2 weeks 04/13/21   Kerin Perna, NP  ?nicotine (NICODERM CQ - DOSED IN MG/24 HR) 7 mg/24hr patch Apply 1 patch (7 mg patch) daily for 3 weeks and stop ? ?Start this after completing 2 weeks of 14mg  patches 04/13/21   Kerin Perna, NP  ?   ? ?Allergies    ?Patient has no known allergies.   ? ?Review of Systems   ?Review of  Systems  ?Constitutional:  Negative for chills, fatigue and fever.  ?HENT:  Negative for congestion.   ?Respiratory:  Negative for cough, chest tightness, shortness of breath and wheezing.   ?Cardiovascular:  Negative for chest pain, palpitations and leg swelling.  ?Gastrointestinal:  Negative for abdominal pain, constipation, diarrhea, nausea and vomiting.  ?Genitourinary:  Negative for flank pain and frequency.  ?Musculoskeletal:  Negative for back pain.  ?Skin:  Negative for rash and wound.  ?Neurological:  Positive for seizures and weakness. Negative for dizziness, light-headedness, numbness and headaches.  ?Psychiatric/Behavioral:  Negative for agitation.   ?All other systems reviewed and are negative. ? ?Physical Exam ?Updated Vital Signs ?BP (!) 178/113   Pulse 82   Temp 98.7 ?F (37.1 ?C) (Oral)   Resp 16   Ht 4\' 11"  (1.499 m)   Wt 65.8 kg   SpO2 95%   BMI 29.29 kg/m?  ?Physical Exam ?Vitals and nursing note reviewed.  ?Constitutional:   ?   General: She is not in acute distress. ?   Appearance: She is well-developed. She is not ill-appearing, toxic-appearing or diaphoretic.  ?HENT:  ?   Head: Normocephalic and atraumatic.  ?   Mouth/Throat:  ?   Mouth: Mucous membranes are moist.  ?Eyes:  ?   General: Visual field deficit present.  ?   Extraocular Movements: Extraocular movements intact.  ?   Conjunctiva/sclera:  Conjunctivae normal.  ?   Pupils: Pupils are equal, round, and reactive to light.  ?Cardiovascular:  ?   Rate and Rhythm: Normal rate and regular rhythm.  ?   Heart sounds: No murmur heard. ?Pulmonary:  ?   Effort: Pulmonary effort is normal. No respiratory distress.  ?   Breath sounds: Normal breath sounds. No wheezing, rhonchi or rales.  ?Chest:  ?   Chest wall: No tenderness.  ?Abdominal:  ?   General: Abdomen is flat.  ?   Palpations: Abdomen is soft.  ?   Tenderness: There is no abdominal tenderness. There is no guarding or rebound.  ?Musculoskeletal:     ?   General: No swelling or  tenderness.  ?   Cervical back: Neck supple.  ?   Right lower leg: No edema.  ?   Left lower leg: No edema.  ?Skin: ?   General: Skin is warm and dry.  ?   Capillary Refill: Capillary refill takes less than 2 seconds.  ?   Findings: No erythema.  ?Neurological:  ?   Mental Status: She is alert.  ?   Cranial Nerves: No facial asymmetry.  ?   Sensory: No sensory deficit.  ?   Motor: Weakness present.  ?   Comments: Patient has weakness in left arm and left leg but was noted after initial exam to have twitching subtly.  Suspect seizure with status focally.  Otherwise sensation intact.  Symmetric smile.  Clear speech.  Patient did have some left-sided visual field deficits compared to right.  ? ? ?ED Results / Procedures / Treatments   ?Labs ?(all labs ordered are listed, but only abnormal results are displayed) ?Labs Reviewed  ?CBC - Abnormal; Notable for the following components:  ?    Result Value  ? Hemoglobin 15.6 (*)   ? All other components within normal limits  ?COMPREHENSIVE METABOLIC PANEL - Abnormal; Notable for the following components:  ? CO2 21 (*)   ? Glucose, Bld 103 (*)   ? Creatinine, Ser 1.05 (*)   ? All other components within normal limits  ?RAPID URINE DRUG SCREEN, HOSP PERFORMED - Abnormal; Notable for the following components:  ? Cocaine POSITIVE (*)   ? Benzodiazepines POSITIVE (*)   ? All other components within normal limits  ?URINALYSIS, ROUTINE W REFLEX MICROSCOPIC - Abnormal; Notable for the following components:  ? Color, Urine STRAW (*)   ? Specific Gravity, Urine 1.002 (*)   ? All other components within normal limits  ?CBG MONITORING, ED - Abnormal; Notable for the following components:  ? Glucose-Capillary 102 (*)   ? All other components within normal limits  ?I-STAT CHEM 8, ED - Abnormal; Notable for the following components:  ? Glucose, Bld 103 (*)   ? Hemoglobin 16.3 (*)   ? HCT 48.0 (*)   ? All other components within normal limits  ?RESP PANEL BY RT-PCR (FLU A&B, COVID) [id number]   ?ETHANOL  ?PROTIME-INR  ?APTT  ?DIFFERENTIAL  ?RAPID URINE DRUG SCREEN, HOSP PERFORMED  ?I-STAT BETA HCG BLOOD, ED ([facility], [facility], [facility] ONLY)  ?TROPONIN I (HIGH SENSITIVITY)  ? ? ?EKG ?EKG Interpretation ? ?Date/Time:  Sunday December 20 2021 13:51:56 EDT ?Ventricular Rate:  59 ?PR Interval:  154 ?QRS Duration: 90 ?QT Interval:  503 ?QTC Calculation: 499 ?R Axis:   -52 ?Text Interpretation: Sinus rhythm Biatrial enlargement LAD, consider left anterior fascicular block Abnormal R-wave progression, early transition LVH with secondary repolarization abnormality Borderline prolonged QT interval  when compared to prior, new diffuse t wave inversions. No STEMI Confirmed by Antony Blackbird [phone]) on 12/20/2021 2:13:03 PM ? ?Radiology ?CT HEAD WO CONTRAST (5MM) ? ?Result Date: 12/20/2021 ?CLINICAL DATA:  Neuro deficit.  Stroke suspected. EXAM: CT HEAD WITHOUT CONTRAST TECHNIQUE: Contiguous axial images were obtained from the base of the skull through the vertex without intravenous contrast. RADIATION DOSE REDUCTION: This exam was performed according to the departmental dose-optimization program which includes automated exposure control, adjustment of the mA and/or kV according to patient size and/or use of iterative reconstruction technique. COMPARISON:  March 11, 2021 FINDINGS: Brain: No subdural, epidural, or subarachnoid hemorrhage. Ventricles and sulci are nondilated. Mild increased prominence of the right lateral ventricle is consistent with ex vacuo effect due to the right MCA chronic infarct. Cerebellum, basal cisterns, and brainstem are normal. The moderate sized right MCA distribution infarct demonstrates increased encephalomalacia since the previous study. No acute infarct or ischemia is noted. No mass effect or midline shift. Vascular: No hyperdense vessel or unexpected calcification. Skull: Normal. Negative for fracture or focal lesion. Sinuses/Orbits: Mild mucosal thickening in scattered paranasal sinuses. Other: None.  IMPRESSION: 1. Chronic moderate size right MCA infarct. No acute intracranial abnormalities are identified. 2. Sinus disease as above. Electronically Signed   By: Dorise Bullion III M.D.   On: 12/20/2021 12:55   ? ?Procedu

## 2021-12-21 ENCOUNTER — Other Ambulatory Visit (HOSPITAL_COMMUNITY): Payer: Self-pay

## 2021-12-21 DIAGNOSIS — R531 Weakness: Secondary | ICD-10-CM

## 2021-12-21 DIAGNOSIS — W19XXXA Unspecified fall, initial encounter: Secondary | ICD-10-CM

## 2021-12-21 LAB — CBC
HCT: 43.6 % (ref 36.0–46.0)
Hemoglobin: 14.5 g/dL (ref 12.0–15.0)
MCH: 31.7 pg (ref 26.0–34.0)
MCHC: 33.3 g/dL (ref 30.0–36.0)
MCV: 95.2 fL (ref 80.0–100.0)
Platelets: 264 10*3/uL (ref 150–400)
RBC: 4.58 MIL/uL (ref 3.87–5.11)
RDW: 12.5 % (ref 11.5–15.5)
WBC: 5.6 10*3/uL (ref 4.0–10.5)
nRBC: 0 % (ref 0.0–0.2)

## 2021-12-21 LAB — BASIC METABOLIC PANEL
Anion gap: 10 (ref 5–15)
BUN: 8 mg/dL (ref 6–20)
CO2: 23 mmol/L (ref 22–32)
Calcium: 9.2 mg/dL (ref 8.9–10.3)
Chloride: 106 mmol/L (ref 98–111)
Creatinine, Ser: 1.05 mg/dL — ABNORMAL HIGH (ref 0.44–1.00)
GFR, Estimated: >60 mL/min (ref >60)
Glucose, Bld: 92 mg/dL (ref 70–99)
Potassium: 3.9 mmol/L (ref 3.5–5.1)
Sodium: 139 mmol/L (ref 135–145)

## 2021-12-21 LAB — LIPID PANEL
Cholesterol: 228 mg/dL — ABNORMAL HIGH (ref 0–200)
HDL: 89 mg/dL (ref >40)
LDL Cholesterol: 122 mg/dL — ABNORMAL HIGH (ref 0–99)
Total CHOL/HDL Ratio: 2.6 RATIO
Triglycerides: 83 mg/dL (ref <150)
VLDL: 17 mg/dL (ref 0–40)

## 2021-12-21 MED ORDER — DIVALPROEX SODIUM 250 MG PO DR TAB
500.0000 mg | DELAYED_RELEASE_TABLET | Freq: Two times a day (BID) | ORAL | Status: DC
  Administered 2021-12-21 – 2021-12-22 (×2): 500 mg via ORAL
  Filled 2021-12-21 (×2): qty 2

## 2021-12-21 MED ORDER — LEVETIRACETAM 500 MG PO TABS
1000.0000 mg | ORAL_TABLET | Freq: Two times a day (BID) | ORAL | Status: DC
  Administered 2021-12-21: 1000 mg via ORAL
  Filled 2021-12-21: qty 2

## 2021-12-21 MED ORDER — NICOTINE 7 MG/24HR TD PT24
7.0000 mg | MEDICATED_PATCH | Freq: Every day | TRANSDERMAL | Status: DC
  Administered 2021-12-21 – 2021-12-22 (×2): 7 mg via TRANSDERMAL
  Filled 2021-12-21 (×2): qty 1

## 2021-12-21 MED ORDER — LEVETIRACETAM 500 MG PO TABS
500.0000 mg | ORAL_TABLET | Freq: Two times a day (BID) | ORAL | 0 refills | Status: DC
  Filled 2021-12-21: qty 60, 30d supply, fill #0

## 2021-12-21 NOTE — Evaluation (Signed)
Physical Therapy Evaluation ?Patient Details ?Name: Brandy Nguyen ?MRN: [id number] ?DOB: 05-13-1966 ?Today's Date: 12/21/2021 ? ?History of Present Illness ? Pt is a 56 y/o female, presenting with left-sided weakness and constant twitching of left sided extremities. PMH is significant for R MCA CVA, HLD, HTN, cocaine abuse.CT R/O any acute abnormalities. Pt admitted for observation with dx: Todd's paralysis and seizure  ?Clinical Impression ? Pt presents with slightly unsteady gait. Expect pt will make good progress with mobility and hopefully be able to return home. Pt's current cognition may present some challenges but this may also be her baseline.    ?   ? ?Recommendations for follow up therapy are one component of a multi-disciplinary discharge planning process, led by the attending physician.  Recommendations may be updated based on patient status, additional functional criteria and insurance authorization. ? ?Follow Up Recommendations Home health PT (if cognition allows) ? ?  ?Assistance Recommended at Discharge Frequent or constant Supervision/Assistance  ?Patient can return home with the following ? Direct supervision/assist for medications management;Direct supervision/assist for financial management;Assist for transportation ? ?  ?Equipment Recommendations None recommended by PT  ?Recommendations for Other Services ?    ?  ?Functional Status Assessment Patient has had a recent decline in their functional status and demonstrates the ability to make significant improvements in function in a reasonable and predictable amount of time.  ? ?  ?Precautions / Restrictions Precautions ?Precautions: Fall;Other (comment) ?Precaution Comments: Falls, seizure precautions ?Restrictions ?Weight Bearing Restrictions: No  ? ?  ? ?Mobility ? Bed Mobility ?Overal bed mobility: Needs Assistance ?Bed Mobility: Supine to Sit, Sit to Supine ?  ?  ?Supine to sit: Supervision, HOB elevated ?Sit to supine: Supervision, HOB  elevated ?  ?General bed mobility comments: Pt moves impulsively. Supervision for safety ?  ? ?Transfers ?Overall transfer level: Needs assistance ?Equipment used: None ?Transfers: Sit to/from Stand ?Sit to Stand: Min guard ?  ?  ?  ?  ?  ?General transfer comment: Assist for safety ?  ? ?Ambulation/Gait ?Ambulation/Gait assistance: Min assist, Min guard ?Gait Distance (Feet): 175 Feet ?Assistive device: 1 person hand held assist ?Gait Pattern/deviations: Step-through pattern, Decreased stride length ?Gait velocity: decr ?Gait velocity interpretation: 1.31 - 2.62 ft/sec, indicative of limited community ambulator ?  ?General Gait Details: Slightly unsteady gait with initially min assist for balance. As distance increased stability improved and only needed min guard. ? ?Stairs ?  ?  ?  ?  ?  ? ?Wheelchair Mobility ?  ? ?Modified Rankin (Stroke Patients Only) ?  ? ?  ? ?Balance Overall balance assessment: Needs assistance, Mild deficits observed, not formally tested ?Sitting-balance support: No upper extremity supported, Feet supported ?Sitting balance-Leahy Scale: Good ?  ?  ?Standing balance support: No upper extremity supported ?Standing balance-Leahy Scale: Fair ?  ?  ?  ?  ?  ?  ?  ?  ?  ?  ?  ?  ?   ? ? ? ?Pertinent Vitals/Pain Pain Assessment ?Pain Assessment: No/denies pain  ? ? ?Home Living Family/patient expects to be discharged to:: Private residence ?Living Arrangements: Alone ?Available Help at Discharge: Family;Available PRN/intermittently (Son) ?Type of Home: Apartment ?Home Access: Stairs to enter ?Entrance Stairs-Rails: None ?Entrance Stairs-Number of Steps: 4 ?  ?Home Layout: One level ?Home Equipment: None ?   ?  ?Prior Function Prior Level of Function : Independent/Modified Independent ?  ?  ?  ?  ?  ?  ?Mobility Comments: Pt reports Independence with  AMB w/o AD ?ADLs Comments: Pt reports Mod I ADL's, homemaking and self care as well as functional mobility related to ADL's. ?  ? ? ?Hand Dominance  ?  Dominant Hand: Right ? ?  ?Extremity/Trunk Assessment  ? Upper Extremity Assessment ?Upper Extremity Assessment: Defer to OT evaluation ?  ? ?Lower Extremity Assessment ?Lower Extremity Assessment: Generalized weakness ?  ? ?   ?Communication  ? Communication: No difficulties  ?Cognition Arousal/Alertness: Awake/alert ?Behavior During Therapy: Impulsive ?Overall Cognitive Status: No family/caregiver present to determine baseline cognitive functioning ?  ?  ?  ?  ?  ?  ?  ?  ?  ?  ?  ?  ?  ?  ?  ?  ?General Comments: Pt lying in bed with spaghetti sauce on sheets, gown half off. Oriented and following commands but unsure of higher cognitive function and of baseline. ?  ?  ? ?  ?General Comments   ? ?  ?Exercises    ? ?Assessment/Plan  ?  ?PT Assessment Patient needs continued PT services  ?PT Problem List Decreased strength;Decreased balance;Decreased mobility;Decreased safety awareness ? ?   ?  ?PT Treatment Interventions DME instruction;Gait training;Stair training;Functional mobility training;Therapeutic activities;Therapeutic exercise;Balance training;Patient/family education;Cognitive remediation   ? ?PT Goals (Current goals can be found in the Care Plan section)  ?Acute Rehab PT Goals ?Patient Stated Goal: not stated ?PT Goal Formulation: With patient ?Time For Goal Achievement: 01/04/22 ?Potential to Achieve Goals: Good ? ?  ?Frequency Min 3X/week ?  ? ? ?Co-evaluation   ?  ?  ?  ?  ? ? ?  ?AM-PAC PT "6 Clicks" Mobility  ?Outcome Measure Help needed turning from your back to your side while in a flat bed without using bedrails?: None ?Help needed moving from lying on your back to sitting on the side of a flat bed without using bedrails?: None ?Help needed moving to and from a bed to a chair (including a wheelchair)?: A Little ?Help needed standing up from a chair using your arms (e.g., wheelchair or bedside chair)?: A Little ?Help needed to walk in hospital room?: A Little ?Help needed climbing 3-5 steps with  a railing? : A Little ?6 Click Score: 20 ? ?  ?End of Session Equipment Utilized During Treatment: Gait belt ?Activity Tolerance: Patient tolerated treatment well ?Patient left: in bed;with call bell/phone within reach;with bed alarm set ?  ?PT Visit Diagnosis: Unsteadiness on feet (R26.81);Muscle weakness (generalized) (M62.81) ?  ? ?Time: 3976-7341 ?PT Time Calculation (min) (ACUTE ONLY): 13 min ? ? ?Charges:   PT Evaluation ?$PT Eval Moderate Complexity: 1 Mod ?  ?  ?   ? ? ?[facility] PT ?Acute Rehabilitation Services ?Pager [phone] ?Office [phone] ? ? ?[facility] ?12/21/2021, 2:40 PM ? ?

## 2021-12-21 NOTE — Plan of Care (Signed)
?  Problem: Clinical Measurements: ?Goal: Diagnostic test results will improve ?Outcome: Progressing ?  ?Problem: Clinical Measurements: ?Goal: Will remain free from infection ?Outcome: Progressing ?Goal: Respiratory complications will improve ?Outcome: Progressing ?Goal: Cardiovascular complication will be avoided ?Outcome: Progressing ?  ?Problem: Elimination: ?Goal: Will not experience complications related to bowel motility ?Outcome: Progressing ?Goal: Will not experience complications related to urinary retention ?Outcome: Progressing ?  ?Problem: Pain Managment: ?Goal: General experience of comfort will improve ?Outcome: Progressing ?  ?Problem: Skin Integrity: ?Goal: Risk for impaired skin integrity will decrease ?Outcome: Progressing ?  ?

## 2021-12-21 NOTE — Progress Notes (Signed)
Family Medicine Teaching Service ?Daily Progress Note ?Intern Pager: [phone] ? ?Patient name: Brandy Nguyen Medical record number: [id number] ?Date of birth: 09-22-1966 Age: 56 y.o. Gender: female ? ?Primary Care Provider: Grayce Sessions, NP ?Consultants: Neuro ?Code Status: Partial (Do not intubate) - confirmed with patient ? ?Pt Overview and Major Events to Date:  ?3/20: admitted ? ?Assessment and Plan: ?Brandy Nguyen is a 56 y.o. female presenting with left-sided weakness and constant twitching of left sided extremities. PMH is significant for R MCA CVA, HLD, HTN, cocaine abuse. ? ?Todd's paralysis: resolved ?Neurologically appropriate without focal neurological deficits appreciated.  BMP, CBC unremarkable.  Patient will likely need antiseizure medication prior to discharge. ?-Neurology following, appreciate recommendations ?-PT/OT eval and treat ?-start anti-epileptic per neuro recommendations ? ?History of right MCA CVA  HLD ?Patient has not been taking medications since around November to January.  Lipid panel reflects this with elevated LDL at 122. ?-Continue atorvastatin 40 mg daily, aspirin 81 mg daily ? ?HTN ?Has not been receiving medications since around November to January.  Blood pressures in the 140s to 150s over 80s to 90s. ?-Continue HCTZ 25 mg daily ?-Hold amlodipine ? ?Medication assistance ?Patient states she has had trouble with her insurance and has not taken her medications since sometime between November to January. ?-TOC consult for medication assistance ? ?FEN/GI: Regular diet ?PPx: Lovenox ?Dispo:Home  today or tomorrow . Barriers include neurology recommendations for antiepileptics and seizure monitoring.  ? ?Subjective:  ?Patient states she was having difficulty standing up while sitting on a bench yesterday and a bystander helped her and called EMS.  While in the hospital, she could not stop shaking.  She states she has only had a seizure in the past once directly after her  stroke.  She states she would not want to be intubated and therefore partial code.  Confirmed emergency contact information.  She wants her pancakes this morning.  Patient also states that she has not taken her medications due to difficulties with insurance since sometime between November to January. ? ?Objective: ?Temp:  [97.6 ?F (36.4 ?C)-98.8 ?F (37.1 ?C)] 97.7 ?F (36.5 ?C) (03/20 0430) ?Pulse Rate:  [53-84] 58 (03/20 0430) ?Resp:  [13-20] 16 (03/20 0430) ?BP: (111-199)/(76-125) 152/84 (03/20 0430) ?SpO2:  [95 %-100 %] 98 % (03/20 0430) ?Weight:  [65.8 kg] 65.8 kg (03/19 1214) ?Physical Exam: ?General: Awake, alert, NAD ?Cardiovascular: RRR, no murmurs auscultated ?Respiratory: CTA B, normal work of breathing ?Neuro: No focal neurological deficits appreciated, EOMI, no tremor of the upper or lower extremities appreciated, sensation intact bilaterally ? ?Laboratory: ?Recent Labs  ?Lab 12/20/21 ?1220 12/20/21 ?1230 12/21/21 ?0411  ?WBC 6.8  --  5.6  ?HGB 15.6* 16.3* 14.5  ?HCT 45.6 48.0* 43.6  ?PLT 285  --  264  ? ?Recent Labs  ?Lab 12/20/21 ?1220 12/20/21 ?1230 12/21/21 ?0411  ?NA 139 139 139  ?K 3.9 4.2 3.9  ?CL 104 106 106  ?CO2 21*  --  23  ?BUN 10 12 8   ?CREATININE 1.05* 1.00 1.05*  ?CALCIUM 9.5  --  9.2  ?PROT 6.5  --   --   ?BILITOT 0.9  --   --   ?ALKPHOS 75  --   --   ?ALT 24  --   --   ?AST 34  --   --   ?GLUCOSE 103* 103* 92  ? ? ?Lipid Panel  ?   ?Component Value Date/Time  ? CHOL 228 (H) 12/21/2021 0411  ?  TRIG 83 12/21/2021 0411  ? HDL 89 12/21/2021 0411  ? CHOLHDL 2.6 12/21/2021 0411  ? VLDL 17 12/21/2021 0411  ? LDLCALC 122 (H) 12/21/2021 0411  ? ? ? ?Imaging/Diagnostic Tests: ?CT HEAD WO CONTRAST ( ) ? ?Result Date: 12/20/2021 ?CLINICAL DATA:  Neuro deficit.  Stroke suspected. EXAM: CT HEAD WITHOUT CONTRAST TECHNIQUE: Contiguous axial images were obtained from the base of the skull through the vertex without intravenous contrast. RADIATION DOSE REDUCTION: This exam was performed according to the  departmental dose-optimization program which includes automated exposure control, adjustment of the mA and/or kV according to patient size and/or use of iterative reconstruction technique. COMPARISON:  March 11, 2021 FINDINGS: Brain: No subdural, epidural, or subarachnoid hemorrhage. Ventricles and sulci are nondilated. Mild increased prominence of the right lateral ventricle is consistent with ex vacuo effect due to the right MCA chronic infarct. Cerebellum, basal cisterns, and brainstem are normal. The moderate sized right MCA distribution infarct demonstrates increased encephalomalacia since the previous study. No acute infarct or ischemia is noted. No mass effect or midline shift. Vascular: No hyperdense vessel or unexpected calcification. Skull: Normal. Negative for fracture or focal lesion. Sinuses/Orbits: Mild mucosal thickening in scattered paranasal sinuses. Other: None. IMPRESSION: 1. Chronic moderate size right MCA infarct. No acute intracranial abnormalities are identified. 2. Sinus disease as above. Electronically Signed   By: Gerome Sam III M.D.   On: 12/20/2021 12:55  ? ?MR BRAIN WO CONTRAST ? ?Result Date: 12/20/2021 ?CLINICAL DATA:  Neuro deficit, acute, stroke suspected EXAM: MRI HEAD WITHOUT CONTRAST TECHNIQUE: Multiplanar, multiecho pulse sequences of the brain and surrounding structures were obtained without intravenous contrast. COMPARISON:  CT head from the same day.  MRI March 10, 2021. FINDINGS: Brain: Remote posterior right MCA territory infarct with associated encephalomalacia, gliosis and evidence of prior hemorrhage. No evidence of acute hemorrhage, acute infarct, mass lesion, midline shift or extra-axial fluid collection. Additional scattered mild T2/FLAIR hyperintensities in the white matter are nonspecific but compatible with chronic microvascular ischemic disease. Vascular: Major arterial flow voids are maintained skull base. Skull and upper cervical spine: Normal marrow signal.  Degenerative changes in the visualized cervical spine. Sinuses/Orbits: Mild paranasal sinus mucosal thickening. No acute orbital findings. Other: No sizable mastoid effusions. IMPRESSION: 1. No evidence of acute intracranial abnormality. 2. Remote right posterior MCA territory infarct. Electronically Signed   By: Feliberto Harts M.D.   On: 12/20/2021 14:11  ? ?EEG adult ? ?Result Date: 12/20/2021 ?Jefferson Fuel, MD     12/20/2021  3:12 PM Routine EEG Report HENLEIGH ROBELLO is a 56 y.o. female with a history of spell who is undergoing an EEG to evaluate for seizures. Report: This EEG was acquired with electrodes placed according to the International 10-20 electrode system (including Fp1, Fp2, F3, F4, C3, C4, P3, P4, O1, O2, T3, T4, T5, T6, A1, A2, Fz, Cz, Pz). The following electrodes were missing or displaced: none. The best background was 9 Hz with overriding beta frequencies. This activity is reactive to stimulation. Sleep was identified by K complexes and sleep spindles. There was no focal slowing. There were no interictal epileptiform discharges. There were no electrographic seizures identified. Photic stimulation and hyperventilation were not performed. Impression: This EEG was obtained while asleep and is normal. Beta frequencies are secondary to medication effect.   Clinical Correlation: Normal EEGs, however, do not rule out epilepsy. Bing Neighbors, MD [facility] [phone] If 7pm- 7am, please page neurology on call as listed in AMION.   ? ? ?  Shelby Mattocksahbura, Anjela Cassara, DO ?12/21/2021, 7:08 AM ?PGY-1, [facility] ?FPTS Intern pager: [phone], text pages welcome ? ?

## 2021-12-21 NOTE — Progress Notes (Signed)
Subjective: No seizures overnight ? ?ROS: negative except above ? ?Examination ? ?Vital signs in last 24 hours: ?Temp:  [97.6 ?F (36.4 ?C)-98.8 ?F (37.1 ?C)] 98 ?F (36.7 ?C) (03/20 1154) ?Pulse Rate:  [53-81] 81 (03/20 1154) ?Resp:  [13-20] 18 (03/20 1154) ?BP: (111-152)/(76-96) 132/80 (03/20 1154) ?SpO2:  [98 %-100 %] 98 % (03/20 1154) ? ?General: lying in bed without clothes, NAD ?Neuro: awake, alert, oriented to self and place, PERLA, EOMI, 5/5in all 4 extremities ? ?Basic Metabolic Panel: ?Recent Labs  ?Lab 12/20/21 ?1220 12/20/21 ?1230 12/21/21 ?0411  ?NA 139 139 139  ?K 3.9 4.2 3.9  ?CL 104 106 106  ?CO2 21*  --  23  ?GLUCOSE 103* 103* 92  ?BUN 10 12 8   ?CREATININE 1.05* 1.00 1.05*  ?CALCIUM 9.5  --  9.2  ? ? ?CBC: ?Recent Labs  ?Lab 12/20/21 ?1220 12/20/21 ?1230 12/21/21 ?0411  ?WBC 6.8  --  5.6  ?NEUTROABS 4.7  --   --   ?HGB 15.6* 16.3* 14.5  ?HCT 45.6 48.0* 43.6  ?MCV 94.8  --  95.2  ?PLT 285  --  264  ? ? ? ?Coagulation Studies: ?Recent Labs  ?  12/20/21 ?1220  ?LABPROT 12.5  ?INR 0.9  ? ? ?Imaging ?Reviewed:  ? ? ? ?ASSESSMENT AND PLAN: 56 year old female with history of right MCA infarct who presented with seizures. ? ?Epilepsy ?Todd's palsy, resolved ?Chronic stroke ?Hyperlipidemia ?Cocaine use disorder ?-No further seizures overnight ? ?Recommendations ?- Continue Keppra 1000 mg twice daily, switch IV to p.o. ?-Continue seizure precautions including do not drive ?-As needed IV Ativan 2 mg for clinical seizure-like activity ?-Management of rest of comorbidities per primary team ?-Follow-up with neurology in 3 months.  Of note patient has had seizure recurrence.  Therefore recommend long-term antiepileptic therapy ? ?ADDENDUM ?- Per medicine team, patient has been agitated and confused since she received keppra this afternoon. Patient has been on this medicine before. However, the agitation is worse since patient received keppra. Therefore, will switch to depakote 500mg  BID. Another differential for  agitation is cocaine use.  ? ?Seizure precautions: ?Per [facility] statutes, patients with seizures are not allowed to drive until they have been seizure-free for six months and cleared by a physician  ?  ?Use caution when using heavy equipment or power tools. Avoid working on ladders or at heights. Take showers instead of baths. Ensure the water temperature is not too high on the home water heater. Do not go swimming alone. Do not lock yourself in a room alone (i.e. bathroom). When caring for infants or small children, sit down when holding, feeding, or changing them to minimize risk of injury to the child in the event you have a seizure. Maintain good sleep hygiene. Avoid alcohol.  ?  ?If patient has another seizure, call 911 and bring them back to the ED if: ?A.  The seizure lasts longer than 5 minutes.      ?B.  The patient doesn't wake shortly after the seizure or has new problems such as difficulty seeing, speaking or moving following the seizure ?C.  The patient was injured during the seizure ?D.  The patient has a temperature over 102 F (39C) ?E.  The patient vomited during the seizure and now is having trouble breathing ?   ?During the Seizure ?  ?- First, ensure adequate ventilation and place patients on the floor on their left side  ?Loosen clothing around the neck and ensure the airway  is patent. If the patient is clenching the teeth, do not force the mouth open with any object as this can cause severe damage ?- Remove all items from the surrounding that can be hazardous. The patient may be oblivious to what's happening and may not even know what he or she is doing. ?If the patient is confused and wandering, either gently guide him/her away and block access to outside areas ?- Reassure the individual and be comforting ?- Call 911. In most cases, the seizure ends before EMS arrives. However, there are cases when seizures may last over 3 to 5 minutes. Or the individual may have developed breathing  difficulties or severe injuries. If a pregnant patient or a person with diabetes develops a seizure, it is prudent to call an ambulance. ?- Finally, if the patient does not regain full consciousness, then call EMS. Most patients will remain confused for about 45 to 90 minutes after a seizure, so you must use judgment in calling for help. ? ?  ? After the Seizure (Postictal Stage) ?  ?After a seizure, most patients experience confusion, fatigue, muscle pain and/or a headache. Thus, one should permit the individual to sleep. For the next few days, reassurance is essential. Being calm and helping reorient the person is also of importance. ?  ?Most seizures are painless and end spontaneously. Seizures are not harmful to others but can lead to complications such as stress on the lungs, brain and the heart. Individuals with prior lung problems may develop labored breathing and respiratory distress.  ? ? I have spent a total of  36  minutes with the patient reviewing hospital notes,  test results, labs and examining the patient as well as establishing an assessment and plan that was discussed personally with the patient and medicine team.  > 50% of time was spent in direct patient care. ?  ?Lindie Spruce ?Epilepsy ?[facility] ?For questions after 5pm please refer to AMION to reach the Neurologist on call ? ?

## 2021-12-21 NOTE — Progress Notes (Signed)
FPTS Brief Progress Note ? ?S: Paged to the floor by charge nurse requesting assistance with keeping the patient in bed.  Is asking for restraints due to getting up and wandering multiple times since night shift has come on.  Patient is redirectable but quickly forgets.  On my evaluation the patient is lying in bed and reports that she has been wandering because she has been looking for her purse which she has lost.  She reports eagerness to leave the hospital but reports that she will stay tonight and will stay in bed. ? ? ?O: ?BP (!) 147/78 (BP Location: Right Arm)   Pulse 63   Temp 98.4 ?F (36.9 ?C) (Oral)   Resp 18   Ht 4\' 11"  (1.499 m)   Wt 65.8 kg   SpO2 97%   BMI 29.29 kg/m?   ?General: Pleasant 56 year old female laying in bed ?Respiratory: Normal work of breathing ? ?A/P: ?AMS ?Spoke with patient as well as charge nurse regarding her agitation and wandering.  Placed an order for a TeleSitter but also spoke with [facility] who will work on finding an in [facility] for the patient.  If the patient becomes agitated asked that the nurse page Journalist, newspaper immediately and we will come to evaluate.  Patient may need oral versus IM sedation if becomes agitated. ? ?[place], MD ?12/21/2021, 8:26 PM ?PGY-3, [facility] Night Resident  ?Please page [phone] with questions.  ?  ?

## 2021-12-21 NOTE — Progress Notes (Signed)
FPTS Brief Progress Note ? ?S: Paged by night RN with concerns of patient being agitated and pulling off monitors and lines. Patient seen and assessed at bedside. RN at bedside. Patient resting comfortably, easily arousable, Aox3. Reports cardiac stickers being uncomfortable so she removed them. Agreeable to having the placed back on later if necessary. Patient requesting crackers. Patient had mild coughing when attempting bedside swallow earlier but was less awake then. RN states she will monitor patient while pt is eating a cracker.  ? ? ?O: ?BP (!) 152/84 (BP Location: Right Arm)   Pulse (!) 58   Temp 97.7 ?F (36.5 ?C) (Oral)   Resp 16   Ht 4\' 11"  (1.499 m)   Wt 65.8 kg   SpO2 98%   BMI 29.29 kg/m?   ? ? ?A/P: ?- No new orders placed.  ?- Orders reviewed. Labs for AM ordered, which was adjusted as needed.  ?- If condition changes, plan includes bedside evaluation.  ? ? , MD ?12/21/2021, 5:26 AM ?PGY-1, [place] [facility] Night Resident  ?Please page [phone] with questions.  ? ?

## 2021-12-21 NOTE — Progress Notes (Signed)
Went to evaluate pt after secure chat by RN who states pt was out of bed, walked to nurses station stating she wanted to leave. When I arrived, pt was lying in bed resting with eyes closed, did not approach or speak to pt as not to upset her. Will allow pt to rest. If this reoccurs, pt should continue to be redirected. If pt cannot be redirected or is a harm to herself or staff, will re-evaluate and consider further measures if needed.  ?

## 2021-12-21 NOTE — Progress Notes (Signed)
Patient has been non-compliant with bed alarm, and wearing hospital gown, is unsteady, has gotten up several times despite education and reminders from staff and alarm. When asked to use call bell patient was aggressive and yelled "I called three minutes ago" and call bell was observed pulled from the wall.  ?

## 2021-12-21 NOTE — Evaluation (Signed)
Occupational Therapy Evaluation Patient Details Name: Brandy Nguyen MRN: [id number] DOB: [birth year] Today's Date: 12/21/2021   History of Present Illness Pt is a 56 y/o female, presenting with left-sided weakness and constant twitching of left sided extremities. PMH is significant for R MCA CVA, HLD, HTN, cocaine abuse.CT R/O any acute abnormalities. Pt admitted for observation with dx: Todd's paralysis.   Clinical Impression   Pt admitted as stated above presenting with deficits as stated below. Pt is currently Min-Mod A for functional mobility/transfers, LB ADL's and self care tasks. Pt lives alone in apartment but states that she has family/friends that may be able to intermittently assist. No family present to provide PLOF or previous cognitive status. Pt requires frequent redirection/repetition of tasks for follow through. Pt PMH of R MCA CVA impacts left visual field and ?cognition. Will follow acutely for OT to assist in maximizing independence with ADL's and functional mobility related to ADL's/self care tasks.     Recommendations for follow up therapy are one component of a multi-disciplinary discharge planning process, led by the attending physician.  Recommendations may be updated based on patient status, additional functional criteria and insurance authorization.   Follow Up Recommendations  Home health OT (Pt may benefit from [facility] for home assessment and recommendations. Continue to monitor in functional context.)    Assistance Recommended at Discharge Intermittent Supervision/Assistance  Patient can return home with the following A little help with walking and/or transfers;A little help with bathing/dressing/bathroom;Assistance with cooking/housework;Assist for transportation;Direct supervision/assist for financial management    Functional Status Assessment  Patient has had a recent decline in their functional status and demonstrates the ability to make significant improvements  in function in a reasonable and predictable amount of time.  Equipment Recommendations  None recommended by OT    Recommendations for Other Services PT consult     Precautions / Restrictions Precautions Precautions: Fall;Other (comment) Precaution Comments: Falls, seizure precautions Restrictions Weight Bearing Restrictions: No      Mobility Bed Mobility Overal bed mobility: Needs Assistance Bed Mobility: Supine to Sit, Sit to Supine     Supine to sit: Min guard, HOB elevated Sit to supine: Min assist   General bed mobility comments: Pt moves impulsively and requires Min assist for safety with frequent vc's for safety/sequencing. Patient Response: Impulsive  Transfers Overall transfer level: Needs assistance Equipment used: 1 person hand held assist Transfers: Sit to/from Stand Sit to Stand: Min assist     General transfer comment: +1 HHA with vc's for safety      Balance Overall balance assessment: Needs assistance, Mild deficits observed, not formally tested Sitting-balance support: No upper extremity supported, Feet supported Sitting balance-Leahy Scale: Fair     Standing balance support: Single extremity supported Standing balance-Leahy Scale: Poor (Note: chair/bed pad on floor in pt room which may have impacted standing balance assistance required, cont to assess in functional context.)     ADL either performed or assessed with clinical judgement   ADL Overall ADL's : Needs assistance/impaired Eating/Feeding: Set up;Bed level Eating/Feeding Details (indicate cue type and reason): Pt eating sitting up in bed. Pt has spilled pancake syrup and utensils in bed and asking "When is my breakfast coming, I want pancakes" Pt educated that she had pancakes and then continued to eat Grooming: Set up;Sitting;Bed level;Min guard   Upper Body Bathing: Set up;Sitting;Bed level;Min guard;Cueing for sequencing   Lower Body Bathing: Minimal assistance;Sitting/lateral  leans;Sit to/from stand;Cueing for safety;Cueing for sequencing Lower Body Bathing Details (indicate cue  type and reason): Pt moves impulsively but is overall Min A for safety and sequencing from sit to stand noted Upper Body Dressing : Set up;Min guard   Lower Body Dressing: Bed level;Sit to/from stand;Minimal assistance;Moderate assistance Lower Body Dressing Details (indicate cue type and reason): Pt is Min-mod A to don socks at bed level at at EOB. Pt attempted to don sock onto left foot x2 w/o success prior to OT assistance. Toilet Transfer: Minimal assistance;Moderate assistance;Cueing for safety;Cueing for sequencing;Stand-pivot Toilet Transfer Details (indicate cue type and reason): Simulated: sit to stand and SPT at EOB for simulated transfer to 3:1. Pt declined need to use bathroom and/or transfer to recliner chair in room. Toileting- Clothing Manipulation and Hygiene: Minimal assistance;Sitting/lateral lean;Sit to/from stand;Moderate assistance;Cueing for safety   Functional mobility during ADLs: Minimal assistance General ADL Comments: Pt requires frequent redirection to tasks and vc's for safety, she is Min-mod A to don socks at bed level at at EOB. Pt attempted to don sock onto left foot x2 w/o success prior to OT assistance. Pt was Min A sit to stand at EOB and is noted to be impulsive with movements. She lives alone and should benefit from acute OT to assist in maximizing indepedence for anticipated d/c home with intermittent family/friend assist PRN.   Vision Baseline Vision/History: 1 Wears glasses (Pt with PMH of left visual field deficits from old R MCA stroke. No recent changes per Pt and CT r/o any acute infarct.) Patient Visual Report: No change from baseline;Peripheral vision impairment (L visual field deficits following old R MCA) Additional Comments: Pt with h/o L visual field deficit from old R MCA     Perception Perception Perception: Not tested   Praxis  Praxis Praxis: Not tested    Pertinent Vitals/Pain Pain Assessment Pain Assessment: No/denies pain    Hand Dominance Right   Extremity/Trunk Assessment Upper Extremity Assessment Upper Extremity Assessment: Overall WFL for tasks assessed   Lower Extremity Assessment Lower Extremity Assessment: Defer to PT evaluation;Overall WFL for tasks assessed      Communication Communication Communication: No difficulties   Cognition Arousal/Alertness: Awake/alert Behavior During Therapy: WFL for tasks assessed/performed Overall Cognitive Status: No family/caregiver present to determine baseline cognitive functioning     General Comments: Pt answering questions when asked given increased time. Pt with h/o R MCA with reported left visual field deficits. Pt states "I have to turn my head far to the left to see things on that side since my stroke". No family present to assist in baseline level of function.     General Comments  Pt required frequent repetetion and redirection to tasks throughout session noted. No family present to assist with PLOF or baseline cognition            Home Living Family/patient expects to be discharged to:: Private residence Living Arrangements: Alone Available Help at Discharge: Family;Available PRN/intermittently (Son) Type of Home: Apartment Home Access: Stairs to enter Secretary/administrator of Steps: 4 Entrance Stairs-Rails: None Home Layout: One level     Bathroom Shower/Tub: IT trainer: Standard Bathroom Accessibility: Yes   Home Equipment: None        Prior Functioning/Environment Prior Level of Function : Independent/Modified Independent     Mobility Comments: Pt reports Independence with AMB w/o AD ADLs Comments: Pt reports Mod I ADL's, homemaking and self care as well as functional mobility related to ADL's.    OT Problem List: Impaired vision/perception;Decreased knowledge of use of DME or  AE;Decreased knowledge of precautions;Decreased activity tolerance;Decreased cognition;Impaired balance (sitting and/or standing);Decreased safety awareness      OT Treatment/Interventions: Self-care/ADL training;Visual/perceptual remediation/compensation;Patient/family education;Therapeutic activities;DME and/or AE instruction;Cognitive remediation/compensation    OT Goals(Current goals can be found in the care plan section) Acute Rehab OT Goals Patient Stated Goal: None stated OT Goal Formulation: Patient unable to participate in goal setting Time For Goal Achievement: 01/05/22 Potential to Achieve Goals: Fair  OT Frequency: Min 2X/week    AM-PAC OT "6 Clicks" Daily Activity     Outcome Measure Help from another person eating meals?: A Little Help from another person taking care of personal grooming?: A Little Help from another person toileting, which includes using toliet, bedpan, or urinal?: A Little Help from another person bathing (including washing, rinsing, drying)?: A Little Help from another person to put on and taking off regular upper body clothing?: A Little Help from another person to put on and taking off regular lower body clothing?: A Lot 6 Click Score: 17   End of Session Equipment Utilized During Treatment: Other (comment) (+1 HHA) Nurse Communication: Mobility status  Activity Tolerance: Patient tolerated treatment well Patient left: in bed;with call bell/phone within reach;with bed alarm set  OT Visit Diagnosis: Unsteadiness on feet (R26.81)                Time: 1610-9604 OT Time Calculation (min): 27 min Charges:  OT General Charges $OT Visit: 1 Visit OT Evaluation $OT Eval Low Complexity: 1 Low OT Treatments $Self Care/Home Management : 8-22 mins  Sayed Apostol Beth Dixon, OTR/L 12/21/2021, 10:00 AM

## 2021-12-21 NOTE — TOC Transition Note (Signed)
Transition of Care (TOC) - CM/SW Discharge Note ? ? ?Patient Details  ?Name: Brandy Nguyen ?MRN: [id number] ?Date of Birth: [birth year] ? ?Transition of Care (TOC) CM/SW Contact:  ?Kermit Balo, RN ?Phone Number: ?12/21/2021, 3:37 PM ? ? ?Clinical Narrative:    ?Patient is discharging home. CM has inquired with [facility] to see if they can see her for [facility] services.  ?Patients discharge medications to be filled through [facility] program and delivered to the room per [facility] pharmacy.  ?Pt will be provided bus pass for transport home.  ? ? ?Final next level of care: Home w Home Health Services ?Barriers to Discharge: Inadequate or no insurance, Barriers Unresolved (comment) ? ? ?Patient Goals and CMS Choice ?  ?  ?  ? ?Discharge Placement ?  ?           ?  ?  ?  ?  ? ?Discharge Plan and Services ?  ?  ?           ?  ?  ?  ?  ?  ?HH Arranged: PT, OT ?HH Agency: [facility] ?Date HH Agency Contacted: 12/21/21 ?  ?Representative spoke with at [facility] Agency: Candise Bowens ? ?Social Determinants of Health (SDOH) Interventions ?  ? ? ?Readmission Risk Interventions ?No flowsheet data found. ? ? ? ? ?

## 2021-12-21 NOTE — Progress Notes (Signed)
Patient refused telemetry monitor, refused to put gown on, refused purewick; being very uncooperative;  oncall provider is aware. ?

## 2021-12-21 NOTE — Progress Notes (Signed)
Patient has continued to ignore bed alarms, has gotten up several times repeatedly despite reminders from staff. MD notified. ?

## 2021-12-21 NOTE — TOC CAGE-AID Note (Signed)
Transition of Care (TOC) - CAGE-AID Screening ? ? ?Patient Details  ?Name: Brandy Nguyen ?MRN: [id number] ?Date of Birth: [birth year] ? ?Transition of Care (TOC) CM/SW Contact:    ?Kermit Balo, RN ?Phone Number: ?12/21/2021, 1:36 PM ? ? ?Clinical Narrative: ?Patient agreeable to outpatient/ inpatient drug counseling resources. CM provided.  ? ? ?CAGE-AID Screening: ?Substance Abuse Screening unable to be completed due to: : Patient unable to participate ? ?Have You Ever Felt You Ought to Cut Down on Your Drinking or Drug Use?: Yes ?Have People Annoyed You By Critizing Your Drinking Or Drug Use?: Yes ?Have You Felt Bad Or Guilty About Your Drinking Or Drug Use?: Yes ?Have You Ever Had a Drink or Used Drugs First Thing In The Morning to Steady Your Nerves or to Get Rid of a Hangover?: No ?CAGE-AID Score: 3 ? ?Substance Abuse Education Offered: Yes ? ?Substance abuse interventions: Educational Materials ? ? ? ? ? ? ?

## 2021-12-21 NOTE — TOC CAGE-AID Note (Signed)
Transition of Care (TOC) - CAGE-AID Screening ? ? ?Patient Details  ?Name: Brandy Nguyen ?MRN: [id number] ?Date of Birth: [birth year] ? ?Transition of Care (TOC) CM/SW Contact:    ?Latisia Hilaire C Tarpley-Carter, LCSWA ?Phone Number: ?12/21/2021, 12:54 PM ? ? ?Clinical Narrative: ?Pt is unable to participate in Cage Aid. ?Pt is partial code.  CSW will assess at a better time. ? ?Insurance underwriter, MSW, LCSW-A ?Pronouns:  She/Her/Hers ?[facility] ?Clinical Social Worker ?Direct Number:  [phone] ?[email or web address] ? ?CAGE-AID Screening: ?Substance Abuse Screening unable to be completed due to: : Patient unable to participate ? ?  ?  ?  ?  ?  ? ?Substance Abuse Education Offered: Yes ? ?Substance abuse interventions: Educational Materials ? ? ? ? ? ? ?

## 2021-12-21 NOTE — Progress Notes (Signed)
SLP Cancellation Note ? ?Patient Details ?Name: Brandy Nguyen ?MRN: [id number] ?DOB: May 25, 1966 ? ? ?Cancelled treatment:       Reason Eval/Treat Not Completed: Other (comment). Pt passed [facility] swallow screen this am and has no prior history of dysphagia. Diet was ordered. Will defer SLP eval at this time. If pt struggles with diet tolerance, please reorder.  ? ?Harlon Ditty, MA CCC-SLP  ?Acute Rehabilitation Services ?Secure Chat Preferred ?Office [phone] ? ?Merrily Tegeler, Riley Nearing ?12/21/2021, 11:03 AM ?

## 2021-12-21 NOTE — Progress Notes (Addendum)
Patient continues to ignore safe limit setting, walked up to nurses station saying she is going to leave, explain she does not have discharge orders patient stated " I can leave because I didn't admit myself" MD notified and on the way to bedside. ?

## 2021-12-21 NOTE — Progress Notes (Signed)
Patient ignored alarm again, was found up in chair eating the rest of her dinner. Patient will agree to get back to bed, and says she understands not getting up, but continues to ignore alarm and does not call despite being asked to.  ?

## 2021-12-22 ENCOUNTER — Other Ambulatory Visit (HOSPITAL_COMMUNITY): Payer: Self-pay

## 2021-12-22 DIAGNOSIS — I10 Essential (primary) hypertension: Secondary | ICD-10-CM

## 2021-12-22 MED ORDER — ATORVASTATIN CALCIUM 40 MG PO TABS
40.0000 mg | ORAL_TABLET | Freq: Every day | ORAL | 1 refills | Status: DC
  Filled 2021-12-22: qty 30, 30d supply, fill #0

## 2021-12-22 MED ORDER — HYDROCHLOROTHIAZIDE 25 MG PO TABS
25.0000 mg | ORAL_TABLET | Freq: Every day | ORAL | 0 refills | Status: DC
  Filled 2021-12-22: qty 30, 30d supply, fill #0

## 2021-12-22 MED ORDER — DIVALPROEX SODIUM 500 MG PO DR TAB
500.0000 mg | DELAYED_RELEASE_TABLET | Freq: Two times a day (BID) | ORAL | 0 refills | Status: DC
  Filled 2021-12-22: qty 60, 30d supply, fill #0

## 2021-12-22 NOTE — Progress Notes (Signed)
Physical Therapy Treatment ?Patient Details ?Name: Brandy Nguyen ?MRN: [id number] ?DOB: [birth year] ?Today's Date: 12/22/2021 ? ? ?History of Present Illness Pt is a 56 y/o female, presenting with left-sided weakness and constant twitching of left sided extremities. PMH is significant for R MCA CVA, HLD, HTN, cocaine abuse.CT R/O any acute abnormalities. Pt admitted for observation with dx: Todd's paralysis. ? ?  ?PT Comments  ? ? Pt now independent with all mobility including getting down to and up from floor in a controlled and safe manner. Still displaying ADL issues from old rt CVA. From PT standpoint ready for DC.    ?Recommendations for follow up therapy are one component of a multi-disciplinary discharge planning process, led by the attending physician.  Recommendations may be updated based on patient status, additional functional criteria and insurance authorization. ? ?Follow Up Recommendations ? Home health PT (for home safety eval) ?  ?  ?Assistance Recommended at Discharge Intermittent Supervision/Assistance  ?Patient can return home with the following Assist for transportation ?  ?Equipment Recommendations ? None recommended by PT  ?  ?Recommendations for Other Services   ? ? ?  ?Precautions / Restrictions Precautions ?Precautions: None ?Precaution Comments: Falls, seizure precautions ?Restrictions ?Weight Bearing Restrictions: No  ?  ? ?Mobility ? Bed Mobility ?Overal bed mobility: Independent ?Bed Mobility: Supine to Sit, Sit to Supine ?  ?  ?Supine to sit: Independent ?Sit to supine: Independent ?  ?  ?  ? ?Transfers ?Overall transfer level: Independent ?Equipment used: None ?Transfers: Sit to/from Stand ?Sit to Stand: Independent ?  ?  ?  ?  ?  ?  ?  ? ?Ambulation/Gait ?Ambulation/Gait assistance: Independent ?Gait Distance (Feet): 500 Feet ?Assistive device: None ?Gait Pattern/deviations: WFL(Within Functional Limits) ?Gait velocity: normal ?Gait velocity interpretation: >2.62 ft/sec, indicative of  community ambulatory ?  ?General Gait Details: Steady gait ? ? ?Stairs ?Stairs: Yes ?Stairs assistance: Modified independent (Device/Increase time) ?Stair Management: One rail Left, Alternating pattern, Forwards ?Number of Stairs: 5 ?General stair comments: No difficulties with stairs ? ? ?Wheelchair Mobility ?  ? ?Modified Rankin (Stroke Patients Only) ?  ? ? ?  ?Balance Overall balance assessment: Independent ?  ?  ?  ?  ?  ?  ?  ?  ?  ?  ?  ?  ?  ?  ?High Level Balance Comments: Pt able to get down onto floor and look under bed for her shoes and stand back up independently. Pt able to pick something up from floor. ?  ?  ?  ?  ? ?  ?Cognition Arousal/Alertness: Awake/alert ?Behavior During Therapy: Impulsive ?Overall Cognitive Status: No family/caregiver present to determine baseline cognitive functioning ?  ?  ?  ?  ?  ?  ?  ?  ?  ?  ?  ?  ?  ?  ?  ?  ?  ?  ?  ? ?  ?Exercises   ? ?  ?General Comments General comments (skin integrity, edema, etc.): No family present to assist with PLOF or baseline cognition. Pt is impulsive ?  ?  ? ?Pertinent Vitals/Pain Pain Assessment ?Pain Assessment: No/denies pain  ? ? ?Home Living Family/patient expects to be discharged to:: Private residence ?Living Arrangements: Alone ?Available Help at Discharge: Family;Available PRN/intermittently (Son) ?Type of Home: Apartment ?Home Access: Stairs to enter ?Entrance Stairs-Rails: None ?Entrance Stairs-Number of Steps: 4 ?  ?Home Layout: One level ?Home Equipment: None ?   ?  ?Prior Function    ?  ?  ?   ? ?  PT Goals (current goals can now be found in the care plan section) Acute Rehab PT Goals ?Patient Stated Goal: not stated ?Progress towards PT goals: Goals met/education completed, patient discharged from PT ? ?  ?Frequency ? ? ?   ? ? ? ?  ?PT Plan Current plan remains appropriate  ? ? ?Co-evaluation   ?  ?  ?  ?  ? ?  ?AM-PAC PT "6 Clicks" Mobility   ?Outcome Measure ? Help needed turning from your back to your side while in a flat  bed without using bedrails?: None ?Help needed moving from lying on your back to sitting on the side of a flat bed without using bedrails?: None ?Help needed moving to and from a bed to a chair (including a wheelchair)?: None ?Help needed standing up from a chair using your arms (e.g., wheelchair or bedside chair)?: None ?Help needed to walk in hospital room?: None ?Help needed climbing 3-5 steps with a railing? : None ?6 Click Score: 24 ? ?  ?End of Session   ?Activity Tolerance: Patient tolerated treatment well ?Patient left: in bed;with call bell/phone within reach;with bed alarm set ?Nurse Communication: Mobility status ?PT Visit Diagnosis: Other abnormalities of gait and mobility (R26.89) ?  ? ? ?Time: 1448-1856 ?PT Time Calculation (min) (ACUTE ONLY): 18 min ? ?Charges:  $Gait Training: 8-22 mins          ?          ? ?[facility] PT ?Acute Rehabilitation Services ?Pager [phone] ?Office [phone] ? ? ? ?Shary Decamp [facility] ?12/22/2021, 12:13 PM ? ?

## 2021-12-22 NOTE — Progress Notes (Signed)
Occupational Therapy Treatment ?Patient Details ?Name: Brandy Nguyen ?MRN: [id number] ?DOB: 05-05-1966 ?Today's Date: 12/22/2021 ? ? ?History of present illness Pt is a 56 y/o female, presenting with left-sided weakness and constant twitching of left sided extremities. PMH is significant for R MCA CVA, HLD, HTN, cocaine abuse.CT R/O any acute abnormalities. Pt admitted for observation with dx: Todd's paralysis. ?  ?OT comments ? Pt requires frequent redirection to task and vc's for safety. She is overall Min guard-Min A dressing L UE/LE likely due to old R MCA w/ left visual field deficit. She required hands on assist to don bra, t-shirt, underwear and sweatpants on L side but is noted to be Mod I on R UE/LE. Pt was Mod I-supervision for toilet transfers. Pt is likely close to her baseline level of function. She plans to return home when d/c from hospital, [facility] and recommendations if possible.   ? ?Recommendations for follow up therapy are one component of a multi-disciplinary discharge planning process, led by the attending physician.  Recommendations may be updated based on patient status, additional functional criteria and insurance authorization. ?   ?Follow Up Recommendations ? Home health OT (HHOT for home assessment and recommendations)  ?  ?Assistance Recommended at Discharge Intermittent Supervision/Assistance  ?Patient can return home with the following ? A little help with walking and/or transfers;A little help with bathing/dressing/bathroom;Assistance with cooking/housework;Assist for transportation;Direct supervision/assist for financial management;Direct supervision/assist for medications management ?  ?Equipment Recommendations ? None recommended by OT  ?  ?Recommendations for Other Services PT consult ? ?  ?Precautions / Restrictions Precautions ?Precautions: Fall;Other (comment) ?Precaution Comments: Falls, seizure precautions  ? ? ?  ? ?Mobility Bed  Mobility ?Overal bed mobility: Needs Assistance, Modified Independent ?Bed Mobility: Supine to Sit, Sit to Supine ?  ?  ?Supine to sit: Modified independent (Device/Increase time), Supervision, HOB elevated ?Sit to supine: Modified independent (Device/Increase time), Supervision ?  ?General bed mobility comments: Pt moves impulsively and was noted to exit bed on right around bed rail, instead of left as disscussed (w/o bed rail). Supervision for safety secondary to L visual field deficits. ?Patient Response: Impulsive ? ?Transfers ?Overall transfer level: Needs assistance ?Equipment used: None ?Transfers: Sit to/from Stand ?Sit to Stand: Supervision ?  ?  ?  ?  ?  ?General transfer comment: Supervision for safety with initial sit to stand from bed ?  ?  ?Balance Overall balance assessment: Needs assistance, Mild deficits observed, not formally tested ?Sitting-balance support: No upper extremity supported, Feet supported ?Sitting balance-Leahy Scale: Good ?  ?  ?Standing balance support: No upper extremity supported ?Standing balance-Leahy Scale: Fair ?  ?   ? ?ADL either performed or assessed with clinical judgement  ? ?ADL Overall ADL's : Needs assistance/impaired ?  ?  ?Grooming: Wash/dry hands;Wash/dry face;Oral care;Applying deodorant;Supervision/safety;Sitting;Standing ?  ?Upper Body Bathing: Sitting;Supervision/ safety;Set up ?  ?Lower Body Bathing: Sitting/lateral leans;Sit to/from stand;Cueing for safety;Supervison/ safety;Set up;Cueing for sequencing ?  ?Upper Body Dressing : Set up;Supervision/safety;Min guard;Sitting;Cueing for sequencing;Cueing for compensatory techniques ?Upper Body Dressing Details (indicate cue type and reason): Min A to don bra and t-shirt on L UE only as well as cueing likely secondary to old R MCA with left visual field deficits. Pt is Mod I R UE ?Lower Body Dressing: Sit to/from stand;Minimal assistance;Cueing for sequencing;Sitting/lateral leans;Modified independent;Min  guard ?Lower Body Dressing Details (indicate cue type and reason): Pt required Min guard assist to don L LE into sweatpants  despite multiple attempts and vc's likely due to old R MCA and visual field deficits. Mod I R LE ?Toilet Transfer: Modified Independent;Supervision/safety;Ambulation;Regular Toilet;Grab bars ?  ?  ?Functional mobility during ADLs: Supervision/safety;Cueing for safety;Cueing for sequencing ?General ADL Comments: Pt requires frequent redirection to task and vc's for safety. She is overall Min guard-Min A dressing L UE/LE likely due to old R MCA w/ visual field deficit. She required hands on assist to don bra, t-shirt, underwear and sweatpants on L side but is noted to be Mod I on R UE/LE. Pt was Mod I-supervision for toilet transfers. Pt plans to return home when d/c from hospital, may benefit from [facility] for home assessment and recommendations if possible. ?  ? ?Extremity/Trunk Assessment Upper Extremity Assessment ?Upper Extremity Assessment: Overall WFL for tasks assessed;Difficult to assess due to impaired cognition (Pt with difficulty donning t-shirt and sweatpants, ?baseline coordination issues. Pt with h/o R MCA CVA and left visual field deficit.) ?  ?Lower Extremity Assessment ?Lower Extremity Assessment: Defer to PT evaluation ?  ?Cervical / Trunk Assessment ?Cervical / Trunk Assessment: Normal ?  ? ?Vision Baseline Vision/History: 1 Wears glasses (PMH: L visual field deficits from old R MCA CVA) ?Patient Visual Report: No change from baseline;Peripheral vision impairment ?Additional Comments: Pt with h/o L visual field deficit from old R MCA CVA ?  ?Perception Perception ?Perception: Impaired ?Comments: Pt with noted difficulty donning bra, pants and t-shirt on left side. Benfits from increased VC's for body awareness during don/doffing process noted during ADL's ?  ?Praxis Praxis ?Praxis: Not tested ?  ? ?Cognition Arousal/Alertness: Awake/alert ?Behavior During Therapy:  Impulsive ?Overall Cognitive Status: No family/caregiver present to determine baseline cognitive functioning ?  ?  ?  ?General Comments: Pt lying in bed with bra off, t shirt on backwards and inside out. Able to follow commands but unsure of higher cognitive function or baseline. No family present or available. ?  ?  ?   ?   ?   ?General Comments No family present to assist with PLOF or baseline cognition. Pt is impulsive  ? ? ?Pertinent Vitals/ Pain       Pain Assessment ?Pain Assessment: No/denies pain ? ?Home Living Family/patient expects to be discharged to:: Private residence ?Living Arrangements: Alone ?Available Help at Discharge: Family;Available PRN/intermittently (Son) ?Type of Home: Apartment ?Home Access: Stairs to enter ?Entrance Stairs-Number of Steps: 4 ?Entrance Stairs-Rails: None ?Home Layout: One level ?  ?  ?Bathroom Shower/Tub: Tub/shower unit;Curtain ?  ?Bathroom Toilet: Standard ?Bathroom Accessibility: Yes ?How Accessible: Accessible via walker ?Home Equipment: None ?  ?  ?  ? ?  ?Prior Functioning/Environment   Mod I per pt report. No family present to assist with PLOF and cognitive deficits. ?   ? ?Frequency ? Min 2X/week  ? ? ? ? ?  ?Progress Toward Goals ? ?OT Goals(current goals can now be found in the care plan section) ? Progress towards OT goals: Progressing toward goals ? ?Acute Rehab OT Goals ?Patient Stated Goal: Go home ?OT Goal Formulation: Patient unable to participate in goal setting ?Time For Goal Achievement: 01/05/22 ?Potential to Achieve Goals: Fair  ?Plan Discharge plan remains appropriate   ? ?   ?AM-PAC OT "6 Clicks" Daily Activity     ?Outcome Measure ? ? Help from another person eating meals?: A Little ?Help from another person taking care of personal grooming?: A Little ?Help from another person toileting, which includes using toliet, bedpan, or urinal?: A Little ?Help from  another person bathing (including washing, rinsing, drying)?: A Little ?Help from another person  to put on and taking off regular upper body clothing?: A Little ?Help from another person to put on and taking off regular lower body clothing?: A Little ?6 Click Score: 18 ? ?  ?End of Session Equipment Utilized During Treatment: Ga

## 2021-12-22 NOTE — TOC Transition Note (Signed)
Transition of Care (TOC) - CM/SW Discharge Note ? ? ?Patient Details  ?Name: Brandy Nguyen ?MRN: [id number] ?Date of Birth: 1966/02/10 ? ?Transition of Care (TOC) CM/SW Contact:  ?Kermit Balo, RN ?Phone Number: ?12/22/2021, 3:13 PM ? ? ?Clinical Narrative:    ?Patient is discharging home with self care. Recommendations for home health services but patient with no insurance and charity HH has declined.  ?Medications for home delivered to the room per [facility] pharmacy. MATCH provided from CM that covered the cost.  ?Patients purse located at [facility] in [place]. CM has provided the bedside RN a cab voucher to get her to [facility] and a bus pass to get her home from [facility].  ?PcP appt on the AVS.  ? ? ?Final next level of care: Home/Self Care ?Barriers to Discharge: Inadequate or no insurance, Barriers Unresolved (comment) ? ? ?Patient Goals and CMS Choice ?  ?  ?  ? ?Discharge Placement ?  ?           ?  ?  ?  ?  ? ?Discharge Plan and Services ?  ?  ?           ?  ?  ?  ?  ?  ?HH Arranged: PT, OT ?HH Agency: [facility] ?Date HH Agency Contacted: 12/21/21 ?  ?Representative spoke with at [facility] Agency: Candise Bowens ? ?Social Determinants of Health (SDOH) Interventions ?  ? ? ?Readmission Risk Interventions ?No flowsheet data found. ? ? ? ? ?

## 2021-12-22 NOTE — Plan of Care (Signed)
?  Problem: Coping: Goal: Ability to adjust to condition or change in health will improve Outcome: Progressing Goal: Ability to identify appropriate support needs will improve Outcome: Progressing   Problem: Medication: Goal: Risk for medication side effects will decrease Outcome: Progressing   Problem: Safety: Goal: Verbalization of understanding the information provided will improve Outcome: Progressing   

## 2021-12-22 NOTE — Discharge Summary (Signed)
Family Medicine Teaching Service ?Hospital Discharge Summary ? ?Patient name: Brandy Nguyen Medical record number: [id number] ?Date of birth: 18-Apr-1966 Age: 56 y.o. Gender: female ?Date of Admission: 12/20/2021  Date of Discharge: 12/22/2021 ?Admitting Physician: Latrelle DodrillBrittany J McIntyre, MD ? ?Primary Care Provider: Grayce SessionsEdwards, Michelle P, NP ?Consultants: Neurology ? ?Indication for Hospitalization: Left-sided weakness ? ?Discharge Diagnoses/Problem List:  ?Principal Problem: ?  Todd's paralysis (HCC) ?Active Problems: ?  Fall ?  Weakness ?  Essential hypertension ?  ? ?Disposition: Home with home health ? ?Discharge Condition: Stable ? ?Discharge Exam:  ?Blood pressure 120/77, pulse 69, temperature 98.4 ?F (36.9 ?C), temperature source Oral, resp. rate 16, height 4\' 11"  (1.499 m), weight 65.8 kg, SpO2 99 %.  ?General: Awake, alert, NAD, calm in conversation ?Cardiovascular: RRR, no murmurs auscultated ?Respiratory: CTA B, normal work of breathing ?Neuro: No focal neurological deficits appreciated, EOMI, no tremor of the upper or lower extremities ?Psych: Normal mood and behavior ? ?Brief Hospital Course:  ?Brandy BuffyWilliemae M Abruzzese is a 56 y.o.female with a history of R MCA CVA, HLD, HTN, polycythemia, cocaine abuse, tobacco use, seizures [facility] Todd[facility]. Her hospital course is detailed below: ? ?Todd's Paralysis ?Patient was admitted for seizure-like activity of the left-sided extremities.  Received midazolam, Ativan, Keppra 2 g.  CT head without acute intracranial abnormalities.  MRI showed no evidence of acute intracranial abnormalities.  EEG unremarkable with the exception of secondary beta frequencies to medication effects.  Further work-up regarding lab work unremarkable with the exception of UDS being cocaine positive (additionally benzo positive however this was taken after receiving benzos during episode).  Patient was started on Keppra however during  hospitalization experienced psychosis with aggressive behavior.  Unable to correlate nature of event however may have been related to Keppra despite patient tolerating it in the past.  Event may have also been related to prior cocaine use or alcohol.  Psychosis resolved by day 3 (the next morning) of hospitalization.  Neurology advised transition to Depakote and patient was discharged with Depakote 500 mg twice daily and advised by PT/OT for home health regarding difficulty balancing due to past stroke.  Seizure precautions were discussed by neurology especially not driving. ? ?HTN  HLD ?Patient was provided several medications on discharge such as HCTZ and atorvastatin.  Amlodipine was held during hospitalization due to lower blood pressures.   ? ?PCP Follow-up Recommendations: ?Restart amlodipine if necessary ?Patient interested in cessation of alcohol and smoking, would benefit from further conversation and medications as appropriate.  Keep in mind patient has had seizures and therefore not advised to have run to clean or future given a decrease seizure threshold ? ?Seizure precautions: ?Per [facility] [place] statutes, patients with seizures are not allowed to drive until they have been seizure-free for six months and cleared by a physician  ?  ?Use caution when using heavy equipment or power tools. Avoid working on ladders or at heights. Take showers instead of baths. Ensure the water temperature is not too high on the home water heater. Do not go swimming alone. Do not lock yourself in a room alone (i.e. bathroom). When caring for infants or small children, sit down when holding, feeding, or changing them to minimize risk of injury to the child in the event you have a seizure. Maintain good sleep hygiene. Avoid alcohol.  ?  ?If patient has another seizure, call 911 and bring them back to the ED if: ?  A.  The seizure lasts longer than 5 minutes.      ?B.  The patient doesn't wake shortly after the seizure or  has new problems such as difficulty seeing, speaking or moving following the seizure ?C.  The patient was injured during the seizure ?D.  The patient has a temperature over 102 F (39C) ?E.  The patient vomited during the seizure and now is having trouble breathing ?   ?During the Seizure ?- First, ensure adequate ventilation and place patients on the floor on their left side  ?Loosen clothing around the neck and ensure the airway is patent. If the patient is clenching the teeth, do not force the mouth open with any object as this can cause severe damage ?- Remove all items from the surrounding that can be hazardous. The patient may be oblivious to what's happening and may not even know what he or she is doing. ?If the patient is confused and wandering, either gently guide him/her away and block access to outside areas ?- Reassure the individual and be comforting ?- Call 911. In most cases, the seizure ends before EMS arrives. However, there are cases when seizures may last over 3 to 5 minutes. Or the individual may have developed breathing difficulties or severe injuries. If a pregnant patient or a person with diabetes develops a seizure, it is prudent to call an ambulance. ?- Finally, if the patient does not regain full consciousness, then call EMS. Most patients will remain confused for about 45 to 90 minutes after a seizure, so you must use judgment in calling for help. ?  ?  ?After the Seizure (Postictal Stage) ?After a seizure, most patients experience confusion, fatigue, muscle pain and/or a headache. Thus, one should permit the individual to sleep. For the next few days, reassurance is essential. Being calm and helping reorient the person is also of importance. ?  ?Most seizures are painless and end spontaneously. Seizures are not harmful to others but can lead to complications such as stress on the lungs, brain and the heart. Individuals with prior lung problems may develop labored breathing and respiratory  distress.  ? ? ?Significant Procedures: None ? ?Significant Labs and Imaging:  ?Recent Labs  ?Lab 12/20/21 ?1220 12/20/21 ?1230 12/21/21 ?0411  ?WBC 6.8  --  5.6  ?HGB 15.6* 16.3* 14.5  ?HCT 45.6 48.0* 43.6  ?PLT 285  --  264  ? ?Recent Labs  ?Lab 12/20/21 ?1220 12/20/21 ?1230 12/21/21 ?0411  ?NA 139 139 139  ?K 3.9 4.2 3.9  ?CL 104 106 106  ?CO2 21*  --  23  ?GLUCOSE 103* 103* 92  ?BUN 10 12 8   ?CREATININE 1.05* 1.00 1.05*  ?CALCIUM 9.5  --  9.2  ?ALKPHOS 75  --   --   ?AST 34  --   --   ?ALT 24  --   --   ?ALBUMIN 3.5  --   --   ? ?Results/Tests Pending at Time of Discharge: None ? ?Discharge Medications:  ?Allergies as of 12/22/2021   ?No Known Allergies ?  ? ?  ?Medication List  ?  ? ?STOP taking these medications   ? ?amLODipine 10 MG tablet ?Commonly known as: NORVASC ?  ? ?  ? ?TAKE these medications   ? ?aspirin 81 MG chewable tablet ?Chew 1 tablet (81 mg total) by mouth daily. ?What changed: Another medication with the same name was removed. Continue taking this medication, and follow the directions you see here. ?  ?  atorvastatin 40 MG tablet ?Commonly known as: LIPITOR ?Take 1 tablet (40 mg total) by mouth daily. ?  ?divalproex 500 MG DR tablet ?Commonly known as: DEPAKOTE ?Take 1 tablet (500 mg total) by mouth 2 (two) times daily. ?  ?hydrochlorothiazide 25 MG tablet ?Commonly known as: HYDRODIURIL ?Take 1 tablet (25 mg total) by mouth daily. ?  ? ?  ? ? ?Discharge Instructions: Please refer to Patient Instructions section of EMR for full details.  Patient was counseled important signs and symptoms that should prompt return to medical care, changes in medications, dietary instructions, activity restrictions, and follow up appointments.  ? ?Follow-Up Appointments: ?Future Appointments  ?Date Time Provider Department Center  ?01/04/2022  9:30 AM Grayce Sessions, NP [facility] None  ? ? ?Shelby Mattocks, DO ?12/22/2021, 3:15 PM ?PGY-1, [facility] ? ?

## 2021-12-22 NOTE — Progress Notes (Addendum)
Subjective: Per RN notes overnight, patient has been getting out of bed, ignoring bed alarms but was redirectable. ? ?ROS: negative except above ? ?Examination ? ?Vital signs in last 24 hours: ?Temp:  [97.9 ?F (36.6 ?C)-98.5 ?F (36.9 ?C)] 98 ?F (36.7 ?C) (03/21 0753) ?Pulse Rate:  [59-81] 70 (03/21 0753) ?Resp:  [16-18] 16 (03/21 0753) ?BP: (123-147)/(73-85) 130/85 (03/21 0753) ?SpO2:  [97 %-100 %] 100 % (03/21 0753) ? ?General: lying in bed, NAD ?Neuro: awake, alert, oriented to self and place, PERLA, EOMI, 5/5in all 4 extremities ? ?Basic Metabolic Panel: ?Recent Labs  ?Lab 12/20/21 ?1220 12/20/21 ?1230 12/21/21 ?0411  ?NA 139 139 139  ?K 3.9 4.2 3.9  ?CL 104 106 106  ?CO2 21*  --  23  ?GLUCOSE 103* 103* 92  ?BUN 10 12 8   ?CREATININE 1.05* 1.00 1.05*  ?CALCIUM 9.5  --  9.2  ? ? ?CBC: ?Recent Labs  ?Lab 12/20/21 ?1220 12/20/21 ?1230 12/21/21 ?0411  ?WBC 6.8  --  5.6  ?NEUTROABS 4.7  --   --   ?HGB 15.6* 16.3* 14.5  ?HCT 45.6 48.0* 43.6  ?MCV 94.8  --  95.2  ?PLT 285  --  264  ? ? ? ?Coagulation Studies: ?Recent Labs  ?  12/20/21 ?1220  ?LABPROT 12.5  ?INR 0.9  ? ? ?Imaging ?No new brain imaging overnight ? ? ?ASSESSMENT AND PLAN: 56 year old female with history of right MCA infarct who presented with seizures. ?  ?Epilepsy ?Todd's palsy, resolved ?Chronic stroke ?Hyperlipidemia ?Cocaine use disorder ?Agitation, resolved ?-No seizures overnight but has been intermittently agitated and not following commands ?  ?Recommendations ?- Continue Depakote DR 500 mg twice daily ?-Discussed importance of taking antiepileptic medications, side effects of Depakote, seizure provoking factors including cocaine use, cocaine cessation counseling.  Patient reports she plans to join the local NA  ?-Continue seizure precautions including do not drive ?-As needed IV Ativan 2 mg for clinical seizure-like activity ?-Management of rest of comorbidities per primary team ?-Follow-up with neurology in 3 months.  Of note patient has had  seizure recurrence.  Therefore recommend long-term antiepileptic therapy ?  ?I have spent a total of  35  minutes with the patient reviewing hospital notes,  test results, labs and examining the patient as well as establishing an assessment and plan that was discussed personally with the patient.  > 50% of time was spent in direct patient care. ? ? ?41 ?Epilepsy ?[facility] ?For questions after 5pm please refer to AMION to reach the Neurologist on call ? ?

## 2021-12-23 ENCOUNTER — Telehealth: Payer: Self-pay

## 2021-12-23 NOTE — Telephone Encounter (Signed)
Transition Care Management Unsuccessful Follow-up Telephone Call ? ?Date of discharge and from where:  [facility] on 12/22/2021 ? ?Attempts:  1st Attempt ? ?Reason for unsuccessful TCM follow-up call:  Left voice message unable to reach pt at this time.  ?Pt has scheduled appt on 01/04/2022 with NP Edwards. ?  ?

## 2021-12-24 ENCOUNTER — Telehealth: Payer: Self-pay

## 2021-12-24 NOTE — Telephone Encounter (Signed)
Transition Care Management Unsuccessful Follow-up Telephone Call ? ?Date of discharge and from where:  12/22/2021, [facility]  ? ?Attempts:  2nd Attempt ? ?Reason for unsuccessful TCM follow-up call:  Left voice message # [phone] ?Call back requested. ? ?Patient has hospital follow up appointment with Jodene Nam, NP at [facility]- 01/04/2022.  ? ? ?

## 2021-12-25 ENCOUNTER — Telehealth: Payer: Self-pay

## 2021-12-25 NOTE — Telephone Encounter (Signed)
Transition Care Management Unsuccessful Follow-up Telephone Call ?  ?Date of discharge and from where:  12/22/2021, [facility]  ?  ?Attempts:  3rd Attempt ?  ?Reason for unsuccessful TCM follow-up call:  Left voice message # [phone] ?Call back requested. ?  ?Patient has hospital follow up appointment with Efrain Sella, NP at [facility]- 01/04/2022.  ?

## 2022-01-04 ENCOUNTER — Encounter (INDEPENDENT_AMBULATORY_CARE_PROVIDER_SITE_OTHER): Payer: Self-pay | Admitting: Primary Care

## 2022-01-04 ENCOUNTER — Telehealth: Payer: Self-pay

## 2022-01-04 ENCOUNTER — Ambulatory Visit (INDEPENDENT_AMBULATORY_CARE_PROVIDER_SITE_OTHER): Payer: Self-pay | Admitting: Primary Care

## 2022-01-04 VITALS — BP 116/79 | HR 61 | Temp 98.0°F | Ht 59.0 in | Wt 149.8 lb

## 2022-01-04 DIAGNOSIS — I10 Essential (primary) hypertension: Secondary | ICD-10-CM

## 2022-01-04 DIAGNOSIS — I63511 Cerebral infarction due to unspecified occlusion or stenosis of right middle cerebral artery: Secondary | ICD-10-CM

## 2022-01-04 DIAGNOSIS — G8384 Todd's paralysis (postepileptic): Secondary | ICD-10-CM

## 2022-01-04 DIAGNOSIS — Z09 Encounter for follow-up examination after completed treatment for conditions other than malignant neoplasm: Secondary | ICD-10-CM

## 2022-01-04 NOTE — Telephone Encounter (Signed)
I spoke to the patient when she was with Gwinda Passe, NP at her appointment today. She explained that she has been denied Medicaid and disability.  She has questions about why she was denied but has not been able to obtain a clear answer from DSS or SSA. She was in agreement to placing a referral to [employer] [place] to assist.  ?The referral was then sent to Donavan Burnet, [facility] ?

## 2022-01-04 NOTE — Progress Notes (Signed)
?[facility] ? ? ?Subjective:  ?[name] is a 56 y.o. female presents for hospital follow up. She was brought in following trauma for confusion by EMS for  possible head injury. She denied hitting her head and denied any headaches. No n/v or vision changes. Patient had subtle twitching of left-sided extremities concerning for focal neurological deficits and was given Ativan in the ED with code stroke activation. 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 Today she is feeling better discussed her UDS results and complications associated with cocaine abuse . Questioned where she got Benzodiazepines from people she was friends with but states no longer associating with them. Patient has No headache, No chest pain, No abdominal pain - No Nausea, No new weakness tingling or numbness, No Cough - shortness of breath ? ?Past Medical History:  ?Diagnosis Date  ? Hyperlipidemia 03/11/2021  ?  ? ?No Known Allergies ? ?Current Outpatient Medications on File Prior to Visit  ?Medication Sig Dispense Refill  ? aspirin 81 MG chewable tablet Chew 1 tablet (81 mg total) by mouth daily. (Patient not taking: Reported on 12/21/2021)    ? atorvastatin (LIPITOR) 40 MG tablet Take 1 tablet (40 mg total) by mouth daily. 90 tablet 1  ? divalproex (DEPAKOTE) 500 MG DR tablet Take 1 tablet (500 mg total) by mouth 2 (two) times daily. 60 tablet 0  ? hydrochlorothiazide (HYDRODIURIL) 25 MG tablet Take 1 tablet (25 mg total) by mouth daily. 90 tablet 0  ? ?No current facility-administered medications on file prior to visit.  ? ?Review of System: ?Comprehensive ROS Pertinent positive and negative noted in HPI   ? ?Objective:  ?BP 116/79 (BP Location: Right Arm, Patient Position: Sitting, Cuff Size: Normal)   Pulse 61   Temp 98 ?F (36.7 ?C) (Oral)   Ht 4\' 11"  (1.499 m)   Wt 149  lb 12.8 oz (67.9 kg)   SpO2 98%   BMI 30.26 kg/m?  ? Weights  ? 01/04/22 0934  ?Weight: 149 lb 12.8 oz (67.9 kg)  ? ?Physical Exam: ?General Appearance: Well nourished, in no apparent distress. ?Eyes: PERRLA, EOMs, conjunctiva no swelling or erythema (left eye vision problems) ?Sinuses: No Frontal/maxillary tenderness ?ENT/Mouth: Ext aud canals clear, TMs without erythema, bulging.  Hearing normal.  ?Neck: Supple, thyroid normal.  ?Respiratory: Respiratory effort normal, BS equal bilaterally without rales, rhonchi, wheezing or stridor.  ?Cardio: RRR with no MRGs. Brisk peripheral pulses without edema.  ?Abdomen: Soft, + BS.  Non tender, no guarding, rebound, hernias, masses. ?Lymphatics: Non tender without lymphadenopathy.  ?Musculoskeletal: Full ROM, 5/5 strength, normal gait.  ?Skin: Warm, dry without rashes, lesions, ecchymosis.  ?Neuro: Cranial nerves intact. Normal muscle tone, no cerebellar symptoms. Sensation intact.  ?Psych: Awake and oriented X 3, normal affect, Insight and Judgment appropriate.  ? ? ?Assessment:  ?[name] was seen today for hospitalization follow-up. ? ?Diagnoses and all orders for this visit: ? ?Essential hypertension ?Well controlled > 130/80, low-sodium, DASH diet, medication compliance, 150 minutes of moderate intensity exercise per week. ?Discussed medication compliance, adverse effects. Cont. HCTZ 25mg  daily  ? ?Hospital discharge follow-up ?Follow-Ups: Follow up with Reinaldo Berber, NP (Internal Medicine) on 01/04/2022; Your appointment is at 9:30 am. Please arrive by 9:15 and bring your current medications ? ?Right middle cerebral artery stroke [facility]) ?-     Ambulatory referral to Neurology ? ?  Todd's paralysis (HCC) ?-     Ambulatory referral to Neurology ? ?  ?This note has been created with Education officer, environmental. Any transcriptional errors are unintentional.  ? ?Grayce Sessions, NP ?01/04/2022, 9:51 AM ?  ? ?

## 2022-01-05 ENCOUNTER — Other Ambulatory Visit: Payer: Self-pay

## 2022-01-05 MED ORDER — HYDROCHLOROTHIAZIDE 25 MG PO TABS
25.0000 mg | ORAL_TABLET | Freq: Every day | ORAL | 1 refills | Status: DC
  Filled 2022-01-05: qty 90, 90d supply, fill #0

## 2022-01-21 ENCOUNTER — Other Ambulatory Visit (INDEPENDENT_AMBULATORY_CARE_PROVIDER_SITE_OTHER): Payer: Self-pay | Admitting: Primary Care

## 2022-01-21 ENCOUNTER — Other Ambulatory Visit: Payer: Self-pay

## 2022-01-21 DIAGNOSIS — I63511 Cerebral infarction due to unspecified occlusion or stenosis of right middle cerebral artery: Secondary | ICD-10-CM

## 2022-01-21 DIAGNOSIS — I639 Cerebral infarction, unspecified: Secondary | ICD-10-CM

## 2022-01-21 DIAGNOSIS — I10 Essential (primary) hypertension: Secondary | ICD-10-CM

## 2022-01-21 MED ORDER — HYDROCHLOROTHIAZIDE 25 MG PO TABS
25.0000 mg | ORAL_TABLET | Freq: Every day | ORAL | 1 refills | Status: DC
  Filled 2022-01-21: qty 30, 30d supply, fill #0

## 2022-01-21 NOTE — Telephone Encounter (Signed)
Requested medication (s) are due for refill today: yes ? ?Requested medication (s) are on the active medication list: yes ? ?Last refill:  depakote- 12/22/21 #60 1 refill, lipitor - 12/22/21 #90 1 refill ? ?Future visit scheduled: yes in 2 months ? ?Notes to clinic:  depakote last ordered by Littie Deeds, MD and lipitor last ordered by Shelby Mattocks, DO. Do you want to refill Rx x 2? ? ? ?  ?Requested Prescriptions  ?Pending Prescriptions Disp Refills  ? atorvastatin (LIPITOR) 40 MG tablet 90 tablet 1  ?  Sig: Take 1 tablet (40 mg total) by mouth daily.  ?  ? Cardiovascular:  Antilipid - Statins Failed - 01/21/2022 10:14 AM  ?  ?  Failed - Lipid Panel in normal range within the last 12 months  ?  Cholesterol  ?Date Value Ref Range Status  ?12/21/2021 228 (H) 0 - 200 mg/dL Final  ? ?LDL Cholesterol  ?Date Value Ref Range Status  ?12/21/2021 122 (H) 0 - 99 mg/dL Final  ?  Comment:  ?         ?Total Cholesterol/HDL:CHD Risk ?Coronary Heart Disease Risk Table ?                    Men   Women ? 1/2 Average Risk   3.4   3.3 ? Average Risk       5.0   4.4 ? 2 X Average Risk   9.6   7.1 ? 3 X Average Risk  23.4   11.0 ?       ?Use the calculated Patient Ratio ?above and the CHD Risk Table ?to determine the patient's CHD Risk. ?       ?ATP III CLASSIFICATION (LDL): ? <100     mg/dL   Optimal ? 195-093  mg/dL   Near or Above ?                   Optimal ? 130-159  mg/dL   Borderline ? 160-189  mg/dL   High ? >267     mg/dL   Very High ?Performed at [facility], [address] [place], [place] [address] ?  ? ?HDL  ?Date Value Ref Range Status  ?12/21/2021 89 >40 mg/dL Final  ? ?Triglycerides  ?Date Value Ref Range Status  ?12/21/2021 83 <150 mg/dL Final  ? ?  ?  ?  Passed - Patient is not pregnant  ?  ?  Passed - Valid encounter within last 12 months  ?  Recent Outpatient Visits   ? ?      ? 2 weeks ago Right middle cerebral artery stroke (HCC)  ? [facility] Grayce Sessions, NP  ? 9 months ago  Right middle cerebral artery stroke [facility])  ? [facility] Grayce Sessions, NP  ? ?  ?  ?Future Appointments   ? ?        ? In 2 months Grayce Sessions, NP [facility]  ? ?  ? ? ?  ?  ?  ? divalproex (DEPAKOTE) 500 MG DR tablet 60 tablet 0  ?  Sig: Take 1 tablet (500 mg total) by mouth 2 (two) times daily.  ?  ? Neurology:  Anticonvulsants - Valproates Failed - 01/21/2022 10:14 AM  ?  ?  Failed - Valproic Acid (serum) in normal range and within 360 days  ?  No results found  for: VALPROATE, VPAT  ?  ?  ?  Passed - AST in normal range and within 360 days  ?  AST  ?Date Value Ref Range Status  ?12/20/2021 34 15 - 41 U/L Final  ?  ?  ?  ?  Passed - ALT in normal range and within 360 days  ?  ALT  ?Date Value Ref Range Status  ?12/20/2021 24 0 - 44 U/L Final  ?  ?  ?  ?  Passed - HGB in normal range and within 360 days  ?  Hemoglobin  ?Date Value Ref Range Status  ?12/21/2021 14.5 12.0 - 15.0 g/dL Final  ?  ?  ?  ?  Passed - PLT in normal range and within 360 days  ?  Platelets  ?Date Value Ref Range Status  ?12/21/2021 264 150 - 400 K/uL Final  ?  ?  ?  ?  Passed - WBC in normal range and within 360 days  ?  WBC  ?Date Value Ref Range Status  ?12/21/2021 5.6 4.0 - 10.5 K/uL Final  ?  ?  ?  ?  Passed - HCT in normal range and within 360 days  ?  HCT  ?Date Value Ref Range Status  ?12/21/2021 43.6 36.0 - 46.0 % Final  ?  ?  ?  ?  Passed - Completed PHQ-2 or PHQ-9 in the last 360 days  ?  ?  Passed - Patient is not pregnant  ?  ?  Passed - Valid encounter within last 12 months  ?  Recent Outpatient Visits   ? ?      ? 2 weeks ago Right middle cerebral artery stroke ([place])  ? [facility] Kerin Perna, NP  ? 9 months ago Right middle cerebral artery stroke [facility])  ? [facility] Kerin Perna, NP  ? ?  ?  ?Future Appointments   ? ?        ? In 2 months Oletta Lamas, Milford Cage, NP [place]  ? ?   ? ? ?  ?  ?  ?Signed Prescriptions Disp Refills  ? hydrochlorothiazide (HYDRODIURIL) 25 MG tablet 90 tablet 1  ?  Sig: Take 1 tablet (25 mg total) by mouth daily.  ?  ? Cardiovascular: Diuretics - Thiazide Failed - 01/21/2022 10:14 AM  ?  ?  Failed - Cr in normal range and within 180 days  ?  Creatinine, Ser  ?Date Value Ref Range Status  ?12/21/2021 1.05 (H) 0.44 - 1.00 mg/dL Final  ?  ?  ?  ?  Passed - K in normal range and within 180 days  ?  Potassium  ?Date Value Ref Range Status  ?12/21/2021 3.9 3.5 - 5.1 mmol/L Final  ?  ?  ?  ?  Passed - Na in normal range and within 180 days  ?  Sodium  ?Date Value Ref Range Status  ?12/21/2021 139 135 - 145 mmol/L Final  ?  ?  ?  ?  Passed - Last BP in normal range  ?  BP Readings from Last 1 Encounters:  ?01/04/22 116/79  ?  ?  ?  ?  Passed - Valid encounter within last 6 months  ?  Recent Outpatient Visits   ? ?      ? 2 weeks ago Right middle cerebral artery stroke ([place])  ? [facility] Kerin Perna, NP  ?  9 months ago Right middle cerebral artery stroke ([place])  ? [facility] Kerin Perna, NP  ? ?  ?  ?Future Appointments   ? ?        ? In 2 months Oletta Lamas, Milford Cage, NP [place]  ? ?  ? ? ?  ?  ?  ? ?

## 2022-01-21 NOTE — Telephone Encounter (Signed)
Requested Prescriptions  ?Pending Prescriptions Disp Refills  ?? atorvastatin (LIPITOR) 40 MG tablet 90 tablet 1  ?  Sig: Take 1 tablet (40 mg total) by mouth daily.  ?  ? Cardiovascular:  Antilipid - Statins Failed - 01/21/2022 10:14 AM  ?  ?  Failed - Lipid Panel in normal range within the last 12 months  ?  Cholesterol  ?Date Value Ref Range Status  ?12/21/2021 228 (H) 0 - 200 mg/dL Final  ? ?LDL Cholesterol  ?Date Value Ref Range Status  ?12/21/2021 122 (H) 0 - 99 mg/dL Final  ?  Comment:  ?         ?Total Cholesterol/HDL:CHD Risk ?Coronary Heart Disease Risk Table ?                    Men   Women ? 1/2 Average Risk   3.4   3.3 ? Average Risk       5.0   4.4 ? 2 X Average Risk   9.6   7.1 ? 3 X Average Risk  23.4   11.0 ?       ?Use the calculated Patient Ratio ?above and the CHD Risk Table ?to determine the patient's CHD Risk. ?       ?ATP III CLASSIFICATION (LDL): ? <100     mg/dL   Optimal ? 100-129  mg/dL   Near or Above ?                   Optimal ? 130-159  mg/dL   Borderline ? 160-189  mg/dL   High ? >190     mg/dL   Very High ?Performed at [facility], [place] [address], [place], [place] [address] ?  ? ?HDL  ?Date Value Ref Range Status  ?12/21/2021 89 >40 mg/dL Final  ? ?Triglycerides  ?Date Value Ref Range Status  ?12/21/2021 83 <150 mg/dL Final  ? ?  ?  ?  Passed - Patient is not pregnant  ?  ?  Passed - Valid encounter within last 12 months  ?  Recent Outpatient Visits   ?      ? 2 weeks ago Right middle cerebral artery stroke ([place])  ? [facility] Kerin Perna, NP  ? 9 months ago Right middle cerebral artery stroke [facility])  ? [facility] Kerin Perna, NP  ?  ?  ?Future Appointments   ?        ? In 2 months Kerin Perna, NP [facility]  ?  ? ?  ?  ?  ?? divalproex (DEPAKOTE) 500 MG DR tablet 60 tablet 0  ?  Sig: Take 1 tablet (500 mg total) by mouth 2 (two) times daily.  ?  ? Neurology:  Anticonvulsants -  Valproates Failed - 01/21/2022 10:14 AM  ?  ?  Failed - Valproic Acid (serum) in normal range and within 360 days  ?  No results found for: VALPROATE, VPAT   ?  ?  Passed - AST in normal range and within 360 days  ?  AST  ?Date Value Ref Range Status  ?12/20/2021 34 15 - 41 U/L Final  ?   ?  ?  Passed - ALT in normal range and within 360 days  ?  ALT  ?Date Value Ref Range Status  ?12/20/2021 24 0 - 44 U/L Final  ?   ?  ?  Passed - HGB in normal range and within 360 days  ?  Hemoglobin  ?Date Value Ref Range Status  ?12/21/2021 14.5 12.0 - 15.0 g/dL Final  ?   ?  ?  Passed - PLT in normal range and within 360 days  ?  Platelets  ?Date Value Ref Range Status  ?12/21/2021 264 150 - 400 K/uL Final  ?   ?  ?  Passed - WBC in normal range and within 360 days  ?  WBC  ?Date Value Ref Range Status  ?12/21/2021 5.6 4.0 - 10.5 K/uL Final  ?   ?  ?  Passed - HCT in normal range and within 360 days  ?  HCT  ?Date Value Ref Range Status  ?12/21/2021 43.6 36.0 - 46.0 % Final  ?   ?  ?  Passed - Completed PHQ-2 or PHQ-9 in the last 360 days  ?  ?  Passed - Patient is not pregnant  ?  ?  Passed - Valid encounter within last 12 months  ?  Recent Outpatient Visits   ?      ? 2 weeks ago Right middle cerebral artery stroke ([place])  ? [facility] Kerin Perna, NP  ? 9 months ago Right middle cerebral artery stroke [facility])  ? [facility] Kerin Perna, NP  ?  ?  ?Future Appointments   ?        ? In 2 months Kerin Perna, NP [facility]  ?  ? ?  ?  ?  ?? hydrochlorothiazide (HYDRODIURIL) 25 MG tablet 90 tablet 1  ?  Sig: Take 1 tablet (25 mg total) by mouth daily.  ?  ? Cardiovascular: Diuretics - Thiazide Failed - 01/21/2022 10:14 AM  ?  ?  Failed - Cr in normal range and within 180 days  ?  Creatinine, Ser  ?Date Value Ref Range Status  ?12/21/2021 1.05 (H) 0.44 - 1.00 mg/dL Final  ?   ?  ?  Passed - K in normal range and within 180 days  ?  Potassium   ?Date Value Ref Range Status  ?12/21/2021 3.9 3.5 - 5.1 mmol/L Final  ?   ?  ?  Passed - Na in normal range and within 180 days  ?  Sodium  ?Date Value Ref Range Status  ?12/21/2021 139 135 - 145 mmol/L Final  ?   ?  ?  Passed - Last BP in normal range  ?  BP Readings from Last 1 Encounters:  ?01/04/22 116/79  ?   ?  ?  Passed - Valid encounter within last 6 months  ?  Recent Outpatient Visits   ?      ? 2 weeks ago Right middle cerebral artery stroke ([place])  ? [facility] Kerin Perna, NP  ? 9 months ago Right middle cerebral artery stroke [facility])  ? [facility] Kerin Perna, NP  ?  ?  ?Future Appointments   ?        ? In 2 months Oletta Lamas, Milford Cage, NP [place]  ?  ? ?  ?  ?  ? ?

## 2022-01-21 NOTE — Telephone Encounter (Addendum)
Medication Refill - Medication: divalproex (DEPAKOTE) 500 MG DR tablet, atorvastatin (LIPITOR) 40 MG tablet, hydrochlorothiazide (HYDRODIURIL) 25 MG tablet. ? ?Has the patient contacted their pharmacy? No. ? ?(Agent: If no, request that the patient contact the pharmacy for the refill. If patient does not wish to contact the pharmacy document the reason why and proceed with request.) ?Patient unsure if she needs to continue taking medication  ? ? ?Preferred Pharmacy (with phone number or street name):  ?[facility] Phone:  [phone]  ?Fax:  [phone]  ?  ? ? ?Has the patient been seen for an appointment in the last year OR does the patient have an upcoming appointment? Yes.   ? ?Agent: Please be advised that RX refills may take up to 3 business days. We ask that you follow-up with your pharmacy. ? ?*Please call patient back and okay to leave a detail message if PCP wants patient continue medication ?

## 2022-01-21 NOTE — Telephone Encounter (Signed)
Routed to PCP 

## 2022-01-25 DIAGNOSIS — Z029 Encounter for administrative examinations, unspecified: Secondary | ICD-10-CM

## 2022-01-27 ENCOUNTER — Other Ambulatory Visit: Payer: Self-pay

## 2022-01-28 ENCOUNTER — Emergency Department (HOSPITAL_COMMUNITY): Payer: Self-pay

## 2022-01-28 ENCOUNTER — Emergency Department (HOSPITAL_COMMUNITY)
Admission: EM | Admit: 2022-01-28 | Discharge: 2022-01-28 | Disposition: A | Discharge status: Home / Self Care | Payer: Self-pay | Attending: Student | Admitting: Student

## 2022-01-28 ENCOUNTER — Other Ambulatory Visit: Payer: Self-pay

## 2022-01-28 DIAGNOSIS — I10 Essential (primary) hypertension: Secondary | ICD-10-CM | POA: Insufficient documentation

## 2022-01-28 DIAGNOSIS — R4182 Altered mental status, unspecified: Secondary | ICD-10-CM | POA: Insufficient documentation

## 2022-01-28 DIAGNOSIS — Z7722 Contact with and (suspected) exposure to environmental tobacco smoke (acute) (chronic): Secondary | ICD-10-CM | POA: Insufficient documentation

## 2022-01-28 DIAGNOSIS — Z79899 Other long term (current) drug therapy: Secondary | ICD-10-CM | POA: Insufficient documentation

## 2022-01-28 DIAGNOSIS — M6281 Muscle weakness (generalized): Secondary | ICD-10-CM | POA: Insufficient documentation

## 2022-01-28 DIAGNOSIS — R42 Dizziness and giddiness: Secondary | ICD-10-CM

## 2022-01-28 DIAGNOSIS — I63511 Cerebral infarction due to unspecified occlusion or stenosis of right middle cerebral artery: Secondary | ICD-10-CM

## 2022-01-28 DIAGNOSIS — Z7982 Long term (current) use of aspirin: Secondary | ICD-10-CM | POA: Insufficient documentation

## 2022-01-28 DIAGNOSIS — I639 Cerebral infarction, unspecified: Secondary | ICD-10-CM

## 2022-01-28 LAB — DIFFERENTIAL
Abs Immature Granulocytes: 0.01 10*3/uL (ref 0.00–0.07)
Basophils Absolute: 0 10*3/uL (ref 0.0–0.1)
Basophils Relative: 1 %
Eosinophils Absolute: 0.2 10*3/uL (ref 0.0–0.5)
Eosinophils Relative: 3 %
Immature Granulocytes: 0 %
Lymphocytes Relative: 27 %
Lymphs Abs: 1.8 10*3/uL (ref 0.7–4.0)
Monocytes Absolute: 0.6 10*3/uL (ref 0.1–1.0)
Monocytes Relative: 9 %
Neutro Abs: 4.1 10*3/uL (ref 1.7–7.7)
Neutrophils Relative %: 60 %

## 2022-01-28 LAB — I-STAT CHEM 8, ED
BUN: 9 mg/dL (ref 6–20)
Calcium, Ion: 1.22 mmol/L (ref 1.15–1.40)
Chloride: 102 mmol/L (ref 98–111)
Creatinine, Ser: 1.1 mg/dL — ABNORMAL HIGH (ref 0.44–1.00)
Glucose, Bld: 116 mg/dL — ABNORMAL HIGH (ref 70–99)
HCT: 43 % (ref 36.0–46.0)
Hemoglobin: 14.6 g/dL (ref 12.0–15.0)
Potassium: 3.9 mmol/L (ref 3.5–5.1)
Sodium: 141 mmol/L (ref 135–145)
TCO2: 30 mmol/L (ref 22–32)

## 2022-01-28 LAB — I-STAT BETA HCG BLOOD, ED (MC, WL, AP ONLY): I-stat hCG, quantitative: <5 m[IU]/mL (ref <5)

## 2022-01-28 LAB — PROTIME-INR
INR: 1 (ref 0.8–1.2)
Prothrombin Time: 12.7 seconds (ref 11.4–15.2)

## 2022-01-28 LAB — COMPREHENSIVE METABOLIC PANEL
ALT: 28 U/L (ref 0–44)
AST: 27 U/L (ref 15–41)
Albumin: 3.5 g/dL (ref 3.5–5.0)
Alkaline Phosphatase: 63 U/L (ref 38–126)
Anion gap: 9 (ref 5–15)
BUN: 8 mg/dL (ref 6–20)
CO2: 30 mmol/L (ref 22–32)
Calcium: 9.8 mg/dL (ref 8.9–10.3)
Chloride: 103 mmol/L (ref 98–111)
Creatinine, Ser: 1.25 mg/dL — ABNORMAL HIGH (ref 0.44–1.00)
GFR, Estimated: 50 mL/min — ABNORMAL LOW (ref >60)
Glucose, Bld: 120 mg/dL — ABNORMAL HIGH (ref 70–99)
Potassium: 3.9 mmol/L (ref 3.5–5.1)
Sodium: 142 mmol/L (ref 135–145)
Total Bilirubin: 0.3 mg/dL (ref 0.3–1.2)
Total Protein: 6.8 g/dL (ref 6.5–8.1)

## 2022-01-28 LAB — CBC
HCT: 44.2 % (ref 36.0–46.0)
Hemoglobin: 14.1 g/dL (ref 12.0–15.0)
MCH: 30.7 pg (ref 26.0–34.0)
MCHC: 31.9 g/dL (ref 30.0–36.0)
MCV: 96.1 fL (ref 80.0–100.0)
Platelets: 183 10*3/uL (ref 150–400)
RBC: 4.6 MIL/uL (ref 3.87–5.11)
RDW: 11.7 % (ref 11.5–15.5)
WBC: 6.7 10*3/uL (ref 4.0–10.5)
nRBC: 0 % (ref 0.0–0.2)

## 2022-01-28 LAB — APTT: aPTT: 27 seconds (ref 24–36)

## 2022-01-28 MED ORDER — DIVALPROEX SODIUM 500 MG PO DR TAB
500.0000 mg | DELAYED_RELEASE_TABLET | Freq: Two times a day (BID) | ORAL | 0 refills | Status: DC
  Filled 2022-01-28: qty 60, 30d supply, fill #0

## 2022-01-28 MED ORDER — ATORVASTATIN CALCIUM 40 MG PO TABS
40.0000 mg | ORAL_TABLET | Freq: Every day | ORAL | 1 refills | Status: DC
  Filled 2022-03-19: qty 90, 90d supply, fill #0

## 2022-01-28 MED ORDER — SODIUM CHLORIDE 0.9% FLUSH: 3.0000 mL | Freq: Once | INTRAVENOUS | Status: DC

## 2022-01-28 MED ORDER — ATORVASTATIN CALCIUM 40 MG PO TABS
40.0000 mg | ORAL_TABLET | Freq: Every day | ORAL | 1 refills | Status: DC
  Filled 2022-01-28: qty 30, 30d supply, fill #0

## 2022-01-28 MED ORDER — DIVALPROEX SODIUM 500 MG PO DR TAB
500.0000 mg | DELAYED_RELEASE_TABLET | Freq: Two times a day (BID) | ORAL | 5 refills | Status: DC
  Filled 2022-01-28 – 2022-03-19 (×2): qty 60, 30d supply, fill #0

## 2022-01-28 MED ORDER — DIVALPROEX SODIUM 250 MG PO DR TAB
500.0000 mg | DELAYED_RELEASE_TABLET | Freq: Once | ORAL | Status: AC
  Administered 2022-01-28: 500 mg via ORAL
  Filled 2022-01-28: qty 2

## 2022-01-28 NOTE — ED Triage Notes (Addendum)
Pt called to report new gait abnormality. Pt normally ambulatory without issue but fell today after feeling unsteady at the bus stop. Denies thinners. LKW 1000. Has left sided weakness and visual field deficits at baseline r/t to multiple past CVA.  ? ?HX: CVA ?

## 2022-01-28 NOTE — ED Provider Notes (Signed)
?[facility] ?Provider Note ? ?CSN: [id number] ?Arrival date & time: 01/28/22 1408 ? ?Chief Complaint(s) ?No chief complaint on file. ? ?HPI ?Brandy Nguyen is a 56 y.o. female with PMH right MCA stroke, seizure with Todd's paralysis, HLD who presents emergency department for evaluation of a gait problem.  Patient states that she started hydrochlorothiazide this morning and shortly after taking her first dose she had acute onset left-sided lower extremity weakness and loss of proprioception causing her to fall off the bus.  She did not strike her head or have loss of consciousness.  Patient received an extensive work-up in the lobby and by the time I evaluated the patient she states her symptoms have completely resolved.  She does have residual left upper extremity weakness from her MCA stroke but otherwise states that her symptoms are gone.  She denies chest pain, shortness of breath, abdominal pain, nausea, vomiting or other systemic symptoms. ? ? ?Past Medical History ?Past Medical History:  ?Diagnosis Date  ? Hyperlipidemia 03/11/2021  ? ?Patient Active Problem List  ? Diagnosis Date Noted  ? Essential hypertension   ? Fall   ? Weakness   ? Todd's paralysis ([place]) 12/20/2021  ? Right middle cerebral artery stroke ([place]) 03/17/2021  ? Dysarthria 03/11/2021  ? 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 Pure hypercholesterolemia 03/11/2021  ? Polycythemia 03/11/2021  ? Leukocytosis 03/11/2021  ? Dehydration 03/11/2021  ? Cerebral edema ([place]) 03/11/2021  ? CVA (cerebral vascular accident) ([place]) 03/10/2021  ? Seizure ([place])   ? Cocaine abuse ([place])   ? Tobacco abuse   ? Left leg weakness   ? Left-sided neglect   ? ?Home Medication(s) ?Prior to Admission medications   ?Medication Sig Start Date End Date Taking? Authorizing Provider  ?aspirin 81 MG chewable tablet Chew 1 tablet (81 mg total) by mouth daily. ?Patient not taking: Reported on 12/21/2021 03/17/21   Autry-Lott, Naaman Plummer, DO   ?atorvastatin (LIPITOR) 40 MG tablet Take 1 tablet (40 mg total) by mouth daily. 01/28/22   Kerin Perna, NP  ?divalproex (DEPAKOTE) 500 MG DR tablet Take 1 tablet (500 mg total) by mouth 2 (two) times daily. 01/28/22   Kerin Perna, NP  ?hydrochlorothiazide (HYDRODIURIL) 25 MG tablet Take 1 tablet (25 mg total) by mouth daily. 01/21/22   Kerin Perna, NP  ?                                                                                                                                  ?Past Surgical History ?No past surgical history on file. ?Family History ?No family history on file. ? ?Social History ?Social History  ? ?Tobacco Use  ? Smoking status: Unknown  ?  Passive exposure: Current  ? Smokeless tobacco: Current  ? Tobacco comments:  ?  Has not smoked in 7 days  ?Substance Use Topics  ? Alcohol use: Yes  ? Drug use:  Yes  ?  Types: Cocaine  ? ?Allergies ?Patient has no known allergies. ? ?Review of Systems ?Review of Systems  ?Neurological:  Positive for weakness.  ?     Gait instability  ? ?Physical Exam ?Vital Signs  ?I have reviewed the triage vital signs ?BP (!) 165/95   Pulse (!) 55   Temp 98.7 ?F (37.1 ?C) (Oral)   Resp 14   SpO2 100%  ? ?Physical Exam ?Vitals and nursing note reviewed.  ?Constitutional:   ?   General: She is not in acute distress. ?   Appearance: She is well-developed.  ?HENT:  ?   Head: Normocephalic and atraumatic.  ?Eyes:  ?   Conjunctiva/sclera: Conjunctivae normal.  ?Cardiovascular:  ?   Rate and Rhythm: Normal rate and regular rhythm.  ?   Heart sounds: No murmur heard. ?Pulmonary:  ?   Effort: Pulmonary effort is normal. No respiratory distress.  ?   Breath sounds: Normal breath sounds.  ?Abdominal:  ?   Palpations: Abdomen is soft.  ?   Tenderness: There is no abdominal tenderness.  ?Musculoskeletal:     ?   General: No swelling.  ?   Cervical back: Neck supple.  ?Skin: ?   General: Skin is warm and dry.  ?   Capillary Refill: Capillary refill takes less  than 2 seconds.  ?Neurological:  ?   Mental Status: She is alert.  ?   Cranial Nerves: No cranial nerve deficit.  ?   Sensory: No sensory deficit.  ?   Motor: Weakness present.  ?Psychiatric:     ?   Mood and Affect: Mood normal.  ? ? ?ED Results and Treatments ?Labs ?(all labs ordered are listed, but only abnormal results are displayed) ?Labs Reviewed  ?COMPREHENSIVE METABOLIC PANEL - Abnormal; Notable for the following components:  ?    Result Value  ? Glucose, Bld 120 (*)   ? Creatinine, Ser 1.25 (*)   ? GFR, Estimated 50 (*)   ? All other components within normal limits  ?I-STAT CHEM 8, ED - Abnormal; Notable for the following components:  ? Creatinine, Ser 1.10 (*)   ? Glucose, Bld 116 (*)   ? All other components within normal limits  ?PROTIME-INR  ?APTT  ?CBC  ?DIFFERENTIAL  ?I-STAT BETA HCG BLOOD, ED ([facility], [facility], [facility] ONLY)  ?CBG MONITORING, ED  ?                                                                                                                       ? ?Radiology ?CT HEAD WO CONTRAST ? ?Result Date: 01/28/2022 ?CLINICAL DATA:  Altered mental status EXAM: CT HEAD WITHOUT CONTRAST TECHNIQUE: Contiguous axial images were obtained from the base of the skull through the vertex without intravenous contrast. RADIATION DOSE REDUCTION: This exam was performed according to the departmental dose-optimization program which includes automated exposure control, adjustment of the mA and/or kV according to patient size and/or use of iterative reconstruction technique. COMPARISON:  CT head 12/20/2021 FINDINGS: Brain: No acute intracranial hemorrhage, mass effect, or herniation. No extra-axial fluid collections. No evidence of acute territorial infarct. No hydrocephalus. Area of encephalomalacia in the right posterior MCA territory from previous infarct. Patchy hypodensities in the periventricular and subcortical white matter, likely secondary to chronic microvascular ischemic changes. Vascular: No hyperdense vessel or  unexpected calcification. Skull: Normal. Negative for fracture or focal lesion. Sinuses/Orbits: No acute finding. Other: None. IMPRESSION: No acute intracranial process identified. Chronic changes including a remote right posterior MCA territory infarct. Electronically Signed   By: Ofilia Neas M.D.   On: 01/28/2022 15:04  ? ?MR BRAIN WO CONTRAST ? ?Result Date: 01/28/2022 ?CLINICAL DATA:  Mental status change, unknown cause EXAM: MRI HEAD WITHOUT CONTRAST TECHNIQUE: Multiplanar, multiecho pulse sequences of the brain and surrounding structures were obtained without intravenous contrast. COMPARISON:  12/20/2021 FINDINGS: Brain: Cortical diffusion hyperintensity in the parasagittal right frontoparietal lobes without corresponding T2 hyperintensity. There is also some similar more lateral right parietal cortical involvement. There is no intracranial mass, mass effect, or edema. Chronic right cerebral infarct involving parietal, occipital, and temporal lobes as well as the posterior insula with chronic blood products and ex vacuo dilatation of the adjacent right lateral ventricle. Additional patchy and confluent areas of T2 hyperintensity in the supratentorial white matter are nonspecific but may reflect stable chronic microvascular ischemic changes. Ventricles are stable in size. There is no hydrocephalus or extra-axial fluid collection. Vascular: Major vessel flow voids at the skull base are preserved. Skull and upper cervical spine: Normal marrow signal is preserved. Sinuses/Orbits: Minor mucosal thickening.  Orbits are unremarkable. Other: Sella is unremarkable.  Mastoid air cells are clear. IMPRESSION: Cortical diffusion hyperintensity in the parasagittal right frontoparietal lobes and possibly the more lateral right parietal lobe as well. There is no corresponding T2 hyperintensity and a somewhat similar appearance on the prior study suggest this is artifactual. Seizure effect is also a consideration in the  appropriate setting. 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 Electronically Signed   By: Macy Mis M.D.   On: 01/28/2022 16:22   ? ?Pertinent labs & imaging results that were available during my care o

## 2022-01-28 NOTE — ED Provider Triage Note (Signed)
Emergency Medicine Provider Triage Evaluation Note ? ?Brandy Nguyen , a 56 y.o. female  was evaluated in triage.  Pt complains of fall from bus, balance change, weakness that started at 10 AM this morning.  Patient reports that she thinks it may be related to her new 25 mg hydrochlorothiazide which she took for the first time today.  Patient also with history of seizures, Todd's paralysis, she denies any seizure-like activity today.  She does not take blood thinners, denies any head injury.  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  She reports that she does not have any significant new weakness or visual field deficits.  She does endorse some new balance issue. ? ?Review of Systems  ?Positive: Balance issue, fall ?Negative: New weakness, head injury, chest pain, shob ? ?Physical Exam  ?BP (!) 173/93 (BP Location: Left Arm)   Pulse 63   Temp 98.7 ?F (37.1 ?C) (Oral)   Resp 16   SpO2 99%  ?Gen:   Awake, no distress   ?Resp:  Normal effort  ?MSK:   Moves extremities without difficulty  ?Other:  Cranial nerves II through XII grossly intact.  Intact finger-nose, intact heel-to-shin.  Romberg somewhat unsteady, gait overall normal but patient endorses significant balance change. Alert and oriented x3.  Moves all 4 limbs spontaneously, normal coordination.  No pronator drift.  Decreased strength left compared to right, but patient reports she is at or near baseline. ? ? ?Medical Decision Making  ?Medically screening exam initiated at 2:23 PM.  Appropriate orders placed.  Brandy Nguyen was informed that the remainder of the evaluation will be completed by another provider, this initial triage assessment does not replace that evaluation, and the importance of remaining in the ED until their evaluation is complete. ? ?Spoke with dr. Amada Jupiter who requests: ct head wo, mr brain. At cusp of TPA window, will not call code stroke at this time. ?   ?Olene Floss, PA-C ?01/28/22 1430 ? ?

## 2022-01-28 NOTE — ED Notes (Signed)
Pt ambulatory without difficulty, gait steady. Pt denies complaints.  ?

## 2022-01-28 NOTE — ED Notes (Signed)
Pt verbalizes understanding of discharge instructions. Opportunity for questions and answers were provided. Pt discharged from the ED.   ?

## 2022-01-29 ENCOUNTER — Other Ambulatory Visit (HOSPITAL_COMMUNITY): Payer: Self-pay

## 2022-01-29 ENCOUNTER — Other Ambulatory Visit: Payer: Self-pay

## 2022-03-19 ENCOUNTER — Other Ambulatory Visit: Payer: Self-pay

## 2022-03-19 ENCOUNTER — Encounter: Payer: Self-pay | Admitting: Neurology

## 2022-03-25 ENCOUNTER — Encounter: Payer: Self-pay | Admitting: Neurology

## 2022-03-25 ENCOUNTER — Other Ambulatory Visit: Payer: Self-pay

## 2022-03-30 ENCOUNTER — Other Ambulatory Visit: Payer: Self-pay

## 2022-04-02 ENCOUNTER — Ambulatory Visit (INDEPENDENT_AMBULATORY_CARE_PROVIDER_SITE_OTHER): Payer: Self-pay | Admitting: Primary Care

## 2022-04-02 ENCOUNTER — Other Ambulatory Visit: Payer: Self-pay

## 2022-04-02 ENCOUNTER — Encounter (INDEPENDENT_AMBULATORY_CARE_PROVIDER_SITE_OTHER): Payer: Self-pay | Admitting: Primary Care

## 2022-04-02 VITALS — BP 135/92 | HR 51 | Temp 98.0°F | Ht 59.0 in | Wt 156.8 lb

## 2022-04-02 DIAGNOSIS — I10 Essential (primary) hypertension: Secondary | ICD-10-CM

## 2022-04-02 DIAGNOSIS — G4700 Insomnia, unspecified: Secondary | ICD-10-CM

## 2022-04-02 MED ORDER — AMLODIPINE BESYLATE 10 MG PO TABS
10.0000 mg | ORAL_TABLET | Freq: Every day | ORAL | 1 refills | Status: DC
  Filled 2022-04-02: qty 30, 30d supply, fill #0

## 2022-04-02 MED ORDER — MELATONIN ER 5 MG PO TBCR
EXTENDED_RELEASE_TABLET | ORAL | 0 refills | Status: DC
  Filled 2022-04-02: qty 90, fill #0

## 2022-04-02 NOTE — Progress Notes (Signed)
[facility]  Brandy Nguyen, is a 56 y.o. female  [id number]  [id number]  DOB - 1966-05-26  Chief Complaint  Patient presents with   Hypertension       Subjective:   Brandy Nguyen is a 56 y.o. female here today for a follow up visit on HTN. Per patient HCTZ was d/c due to it was giving her the shakes. No Bp medication replacement .After a CVA BP needs to be controlled. Patient has  No chest pain, No abdominal pain - No Nausea, No new weakness tingling or numbness, No Cough - shortness of breath. She continues to have headaches , dizziness and confused daily. OTC ibuprofen 200mg  (2) resolves but returns the next day , dizziness is positional and memory loss deferred to neurology.  No problems updated.  No Known Allergies  Past Medical History:  Diagnosis Date   Hyperlipidemia 03/11/2021    Current Outpatient Medications on File Prior to Visit  Medication Sig Dispense Refill   atorvastatin (LIPITOR) 40 MG tablet Take 1 tablet (40 mg total) by mouth once daily. 90 tablet 1   divalproex (DEPAKOTE) 500 MG DR tablet Take 1 tablet (500 mg total) by mouth 2 (two) times daily. 60 tablet 5   aspirin 81 MG chewable tablet Chew 1 tablet (81 mg total) by mouth daily. (Patient not taking: Reported on 12/21/2021)     No current facility-administered medications on file prior to visit.  Comprehensive ROS Pertinent positive and negative noted in HPI    Objective:   Vitals:   04/02/22 1111  BP: (!) 135/92  Pulse: (!) 51  Temp: 98 F (36.7 C)  TempSrc: Oral  SpO2: 98%  Weight: 156 lb 12.8 oz (71.1 kg)  Height: 4\' 11"  (1.499 m)    Exam General appearance : Awake, alert, not in any distress. Speech Clear. Not toxic looking HEENT: Atraumatic and Normocephalic, pupils equally reactive to light and accomodation Neck: Supple, no JVD. No cervical lymphadenopathy.  Chest: Good air entry bilaterally, no added sounds  CVS: S1 S2 regular, no murmurs.  Abdomen: Bowel  sounds present, Non tender and not distended with no gaurding, rigidity or rebound. Extremities: B/L Lower Ext shows no edema, both legs are warm to touch Neurology: Awake alert, and oriented X 3, Non focal Skin: No Rash  Data Review Lab Results  Component Value Date   HGBA1C 5.3 03/11/2021    Assessment & Plan  Brandy Nguyen was seen today for hypertension.  Diagnoses and all orders for this visit:  Insomnia, unspecified type Patient insomnia is due to sleeps all day and up all night needs to change sleeping pattern  Information for insomnia on AVS -     Melatonin ER 5 MG TBCR; Take 1 tablet before bedtime if does not work may take additional 1 . Do not take more then 10 mg at night  Essential hypertension BP goal - < 130/80 Explained that having normal blood pressure is the goal and medications are helping to get to goal and maintain normal blood pressure. DIET: Limit salt intake, read nutrition labels to check salt content, limit fried and high fatty foods  Avoid using multisymptom OTC cold preparations that generally contain sudafed which can rise BP. Consult with pharmacist on best cold relief products to use for persons with HTN EXERCISE Discussed incorporating exercise such as walking - 30 minutes most days of the week and can do in 10 minute intervals    -     amLODipine (NORVASC) 10  MG tablet; Take 1 tablet (10 mg total) by mouth daily.  Patient have been counseled extensively about nutrition and exercise. Other issues discussed during this visit include: low cholesterol diet, weight control and daily exercise, foot care, annual eye examinations at Ophthalmology, importance of adherence with medications and regular follow-up. We also discussed long term complications of uncontrolled diabetes and hypertension.   Return in about 3 months (around 07/03/2022) for Bp f/u.  The patient was given clear instructions to go to ER or return to medical center if symptoms don't improve,  worsen or new problems develop. The patient verbalized understanding. The patient was told to call to get lab results if they haven't heard anything in the next week.   This note has been created with Education officer, environmental. Any transcriptional errors are unintentional.   Grayce Sessions, NP 04/02/2022, 11:42 AM

## 2022-04-02 NOTE — Patient Instructions (Signed)
Insomnia Insomnia is a sleep disorder that makes it difficult to fall asleep or stay asleep. Insomnia can cause fatigue, low energy, difficulty concentrating, mood swings, and poor performance at work or school. There are three different ways to classify insomnia: Difficulty falling asleep. Difficulty staying asleep. Waking up too early in the morning. Any type of insomnia can be long-term (chronic) or short-term (acute). Both are common. Short-term insomnia usually lasts for 3 months or less. Chronic insomnia occurs at least three times a week for longer than 3 months. What are the causes? Insomnia may be caused by another condition, situation, or substance, such as: Having certain mental health conditions, such as anxiety and depression. Using caffeine, alcohol, tobacco, or drugs. Having gastrointestinal conditions, such as gastroesophageal reflux disease (GERD). Having certain medical conditions. These include: Asthma. Alzheimer's disease. Stroke. Chronic pain. An overactive thyroid gland (hyperthyroidism). Other sleep disorders, such as restless legs syndrome and sleep apnea. Menopause. Sometimes, the cause of insomnia may not be known. What increases the risk? Risk factors for insomnia include: Gender. Females are affected more often than males. Age. Insomnia is more common as people get older. Stress and certain medical and mental health conditions. Lack of exercise. Having an irregular work schedule. This may include working night shifts and traveling between different time zones. What are the signs or symptoms? If you have insomnia, the main symptom is having trouble falling asleep or having trouble staying asleep. This may lead to other symptoms, such as: Feeling tired or having low energy. Feeling nervous about going to sleep. Not feeling rested in the morning. Having trouble concentrating. Feeling irritable, anxious, or depressed. How is this diagnosed? This condition  may be diagnosed based on: Your symptoms and medical history. Your health care provider may ask about: Your sleep habits. Any medical conditions you have. Your mental health. A physical exam. How is this treated? Treatment for insomnia depends on the cause. Treatment may focus on treating an underlying condition that is causing the insomnia. Treatment may also include: Medicines to help you sleep. Counseling or therapy. Lifestyle adjustments to help you sleep better. Follow these instructions at home: Eating and drinking  Limit or avoid alcohol, caffeinated beverages, and products that contain nicotine and tobacco, especially close to bedtime. These can disrupt your sleep. Do not eat a large meal or eat spicy foods right before bedtime. This can lead to digestive discomfort that can make it hard for you to sleep. Sleep habits  Keep a sleep diary to help you and your health care provider figure out what could be causing your insomnia. Write down: When you sleep. When you wake up during the night. How well you sleep and how rested you feel the next day. Any side effects of medicines you are taking. What you eat and drink. Make your bedroom a dark, comfortable place where it is easy to fall asleep. Put up shades or blackout curtains to block light from outside. Use a white noise machine to block noise. Keep the temperature cool. Limit screen use before bedtime. This includes: Not watching TV. Not using your smartphone, tablet, or computer. Stick to a routine that includes going to bed and waking up at the same times every day and night. This can help you fall asleep faster. Consider making a quiet activity, such as reading, part of your nighttime routine. Try to avoid taking naps during the day so that you sleep better at night. Get out of bed if you are still awake after   15 minutes of trying to sleep. Keep the lights down, but try reading or doing a quiet activity. When you feel  sleepy, go back to bed. General instructions Take over-the-counter and prescription medicines only as told by your health care provider. Exercise regularly as told by your health care provider. However, avoid exercising in the hours right before bedtime. Use relaxation techniques to manage stress. Ask your health care provider to suggest some techniques that may work well for you. These may include: Breathing exercises. Routines to release muscle tension. Visualizing peaceful scenes. Make sure that you drive carefully. Do not drive if you feel very sleepy. Keep all follow-up visits. This is important. Contact a health care provider if: You are tired throughout the day. You have trouble in your daily routine due to sleepiness. You continue to have sleep problems, or your sleep problems get worse. Get help right away if: You have thoughts about hurting yourself or someone else. Get help right away if you feel like you may hurt yourself or others, or have thoughts about taking your own life. Go to your nearest emergency room or: Call 911. Call the National Suicide Prevention Lifeline at 1-800-273-8255 or 988. This is open 24 hours a day. Text the Crisis Text Line at 741741. Summary Insomnia is a sleep disorder that makes it difficult to fall asleep or stay asleep. Insomnia can be long-term (chronic) or short-term (acute). Treatment for insomnia depends on the cause. Treatment may focus on treating an underlying condition that is causing the insomnia. Keep a sleep diary to help you and your health care provider figure out what could be causing your insomnia. This information is not intended to replace advice given to you by your health care provider. Make sure you discuss any questions you have with your health care provider. Document Revised: 08/31/2021 Document Reviewed: 08/31/2021 Elsevier Patient Education  2023 [employer] Inc.  

## 2022-04-05 ENCOUNTER — Ambulatory Visit (INDEPENDENT_AMBULATORY_CARE_PROVIDER_SITE_OTHER): Payer: Self-pay | Admitting: Primary Care

## 2022-04-09 ENCOUNTER — Ambulatory Visit: Payer: Self-pay | Admitting: Neurology

## 2022-04-09 ENCOUNTER — Encounter: Payer: Self-pay | Admitting: Neurology

## 2022-04-09 DIAGNOSIS — Z029 Encounter for administrative examinations, unspecified: Secondary | ICD-10-CM

## 2022-04-26 ENCOUNTER — Ambulatory Visit: Payer: Self-pay | Admitting: Neurology

## 2022-04-26 ENCOUNTER — Encounter: Payer: Self-pay | Admitting: Neurology

## 2022-05-03 ENCOUNTER — Ambulatory Visit: Payer: Self-pay | Admitting: Neurology

## 2022-05-03 DIAGNOSIS — Z029 Encounter for administrative examinations, unspecified: Secondary | ICD-10-CM

## 2022-05-26 ENCOUNTER — Ambulatory Visit: Payer: Self-pay | Admitting: Neurology

## 2022-06-23 ENCOUNTER — Encounter (INDEPENDENT_AMBULATORY_CARE_PROVIDER_SITE_OTHER): Payer: Self-pay | Admitting: Primary Care

## 2022-07-02 ENCOUNTER — Ambulatory Visit (INDEPENDENT_AMBULATORY_CARE_PROVIDER_SITE_OTHER): Payer: Self-pay | Admitting: Primary Care

## 2022-08-06 ENCOUNTER — Telehealth (INDEPENDENT_AMBULATORY_CARE_PROVIDER_SITE_OTHER): Payer: Self-pay | Admitting: Primary Care

## 2022-08-06 ENCOUNTER — Ambulatory Visit (INDEPENDENT_AMBULATORY_CARE_PROVIDER_SITE_OTHER): Payer: Self-pay | Admitting: Primary Care

## 2022-08-06 ENCOUNTER — Encounter (INDEPENDENT_AMBULATORY_CARE_PROVIDER_SITE_OTHER): Payer: Self-pay | Admitting: Primary Care

## 2022-08-06 VITALS — BP 125/81 | HR 62 | Resp 16 | Wt 148.6 lb

## 2022-08-06 DIAGNOSIS — E6609 Other obesity due to excess calories: Secondary | ICD-10-CM

## 2022-08-06 DIAGNOSIS — Z6830 Body mass index (BMI) 30.0-30.9, adult: Secondary | ICD-10-CM

## 2022-08-06 DIAGNOSIS — I10 Essential (primary) hypertension: Secondary | ICD-10-CM

## 2022-08-06 DIAGNOSIS — G8384 Todd's paralysis (postepileptic): Secondary | ICD-10-CM

## 2022-08-06 NOTE — Telephone Encounter (Signed)
Copied from [place] [id number]. Topic: Referral - Request for Referral >> Aug 06, 2022  1:35 PM Sabas Sous wrote: Has patient seen PCP for this complaint? Yes.   *If NO, is insurance requiring patient see PCP for this issue before PCP can refer them? Referral for which specialty: Neurology  Preferred provider/office: [facility]  Reason for referral: Wants referral transferred   [phone] please contact her at this phone number

## 2022-08-09 NOTE — Telephone Encounter (Signed)
Will forward to Nora  

## 2022-08-13 NOTE — Progress Notes (Signed)
[facility]   [name] is a 56 y.o. female presents for hypertension evaluation, Denies shortness of breath, headaches, chest pain or lower extremity edema, sudden onset, vision changes, unilateral weakness, dizziness, paresthesias   Patient reports adherence with medications.  Dietary habits include: monitoring sodium intake  Exercise habits include:  Family / Social history: Unknown   Past Medical History:  Diagnosis Date   Hyperlipidemia 03/11/2021   History reviewed. No pertinent surgical history. No Known Allergies Current Outpatient Medications on File Prior to Visit  Medication Sig Dispense Refill   amLODipine (NORVASC) 10 MG tablet Take 1 tablet (10 mg total) by mouth daily. (Patient not taking: Reported on 08/06/2022) 90 tablet 1   aspirin 81 MG chewable tablet Chew 1 tablet (81 mg total) by mouth daily. (Patient not taking: Reported on 12/21/2021)     atorvastatin (LIPITOR) 40 MG tablet Take 1 tablet (40 mg total) by mouth once daily. (Patient not taking: Reported on 08/06/2022) 90 tablet 1   divalproex (DEPAKOTE) 500 MG DR tablet Take 1 tablet (500 mg total) by mouth 2 (two) times daily. (Patient not taking: Reported on 08/06/2022) 60 tablet 5   Melatonin ER 5 MG TBCR Take 1 tablet before bedtime if does not work may take additional 1 . Do not take more then 10 mg at night (Patient not taking: Reported on 08/06/2022) 90 tablet 0   No current facility-administered medications on file prior to visit.   Social History   Socioeconomic History   Marital status: Married    Spouse name: Not on file   Number of children: Not on file   Years of education: Not on file   Highest education level: Not on file  Occupational History   Not on file  Tobacco Use   Smoking status: Unknown    Passive exposure: Current   Smokeless tobacco: Current  Substance and Sexual Activity   Alcohol use: Yes   Drug use: Yes    Types: Cocaine   Sexual activity: Not on  file  Other Topics Concern   Not on file  Social History Narrative   ** Merged History Encounter **       Social Determinants of Health   Financial Resource Strain: Not on file  Food Insecurity: Not on file  Transportation Needs: Not on file  Physical Activity: Not on file  Stress: Not on file  Social Connections: Not on file  Intimate Partner Violence: Not on file   History reviewed. No pertinent family history.   OBJECTIVE:  Vitals:   08/06/22 1020  BP: 125/81  Pulse: 62  Resp: 16  SpO2: 97%  Weight: 148 lb 9.6 oz (67.4 kg)   Physical exam: General: Vital signs reviewed.  Patient is well-developed and well-nourished, xx in no acute distress and cooperative with exam. Head: Normocephalic and atraumatic. Eyes: EOMI, conjunctivae normal, no scleral icterus. Neck: Supple, trachea midline, normal ROM, no JVD, masses, thyromegaly, or carotid bruit present. Cardiovascular: RRR, S1 normal, S2 normal, no murmurs, gallops, or rubs. Pulmonary/Chest: Clear to auscultation bilaterally, no wheezes, rales, or rhonchi. Abdominal: Soft, non-tender, non-distended, BS +, no masses, organomegaly, or guarding present. Musculoskeletal: No joint deformities, erythema, or stiffness, ROM full and nontender. Extremities: No lower extremity edema bilaterally,  pulses symmetric and intact bilaterally. No cyanosis or clubbing. Neurological: A&O x3, Strength is normal Skin: Warm, dry and intact. No rashes or erythema. Psychiatric: Normal mood and affect. speech and behavior is normal. Cognition and memory are normal.  ROS Comprehensive ROS Pertinent positive and negative noted in HPI   Last 3 Office BP readings: BP Readings from Last 3 Encounters:  08/06/22 125/81  04/02/22 (!) 135/92  01/28/22 (!) 142/96    BMET    Component Value Date/Time   NA 141 01/28/2022 1444   K 3.9 01/28/2022 1444   CL 102 01/28/2022 1444   CO2 30 01/28/2022 1431   GLUCOSE 116 (H) 01/28/2022 1444   BUN 9  01/28/2022 1444   CREATININE 1.10 (H) 01/28/2022 1444   CALCIUM 9.8 01/28/2022 1431   GFRNONAA 50 (L) 01/28/2022 1431    Renal function: CrCl cannot be calculated (Patient's most recent lab result is older than the maximum 21 days allowed.).  Clinical ASCVD:  The ASCVD Risk score (Arnett DK, et al., 2019) failed to calculate for the following reasons:   The patient has a prior MI or stroke diagnosis  ASCVD risk factors include- [place]   ASSESSMENT & PLAN: Jayd was seen today for hypertension.  Diagnoses and all orders for this visit:  Todd's paralysis (HCC) -     Ambulatory referral to Neurology  Class 1 obesity due to excess calories with serious comorbidity and body mass index (BMI) of 30.0 to 30.9 in adult Obesity is 30-39 indicating an excess in caloric intake or underlining conditions. This may lead to other co-morbidities. Educated on lifestyle modifications of diet and exercise which may reduce obesity.    Essential hypertension -Counseled on lifestyle modifications for blood pressure control including reduced dietary sodium, increased exercise, weight reduction and adequate sleep. Also, educated patient about the risk for cardiovascular events, stroke and heart attack. Also counseled patient about the importance of medication adherence. If you participate in smoking, it is important to stop using tobacco as this will increase the risks associated with uncontrolled blood pressure.   -Goal BP:  For patients younger than 60: Goal BP < 130/80. For patients 60 and older: Goal BP < 140/90. For patients with diabetes: Goal BP < 130/80. Your most recent BP: 125/81  Minimize salt intake. Minimize alcohol intake    This note has been created with Education officer, environmental. Any transcriptional errors are unintentional.   Grayce Sessions, NP 08/13/2022, 10:35 AM

## 2022-09-13 ENCOUNTER — Encounter: Payer: Self-pay | Admitting: Neurology

## 2022-09-13 ENCOUNTER — Ambulatory Visit: Payer: Medicaid Other | Admitting: Neurology

## 2022-09-13 ENCOUNTER — Other Ambulatory Visit: Payer: Self-pay

## 2022-09-13 VITALS — BP 136/85 | HR 73 | Ht 59.0 in | Wt 155.0 lb

## 2022-09-13 DIAGNOSIS — G40909 Epilepsy, unspecified, not intractable, without status epilepticus: Secondary | ICD-10-CM | POA: Diagnosis not present

## 2022-09-13 DIAGNOSIS — I63511 Cerebral infarction due to unspecified occlusion or stenosis of right middle cerebral artery: Secondary | ICD-10-CM | POA: Diagnosis not present

## 2022-09-13 DIAGNOSIS — I10 Essential (primary) hypertension: Secondary | ICD-10-CM

## 2022-09-13 DIAGNOSIS — I639 Cerebral infarction, unspecified: Secondary | ICD-10-CM | POA: Diagnosis not present

## 2022-09-13 MED ORDER — DOCUSATE SODIUM 100 MG PO CAPS
100.0000 mg | ORAL_CAPSULE | Freq: Two times a day (BID) | ORAL | 11 refills | Status: DC
  Filled 2022-09-13: qty 100, 50d supply, fill #0

## 2022-09-13 MED ORDER — DIVALPROEX SODIUM ER 500 MG PO TB24
1000.0000 mg | ORAL_TABLET | Freq: Every day | ORAL | 3 refills | Status: DC
  Filled 2022-09-13: qty 180, 90d supply, fill #0
  Filled 2023-05-18: qty 180, 90d supply, fill #1

## 2022-09-13 MED ORDER — ATORVASTATIN CALCIUM 40 MG PO TABS
40.0000 mg | ORAL_TABLET | Freq: Every day | ORAL | 3 refills | Status: DC
  Filled 2022-09-13: qty 90, 90d supply, fill #0
  Filled 2023-05-13: qty 90, 90d supply, fill #1

## 2022-09-13 MED ORDER — AMLODIPINE BESYLATE 10 MG PO TABS
10.0000 mg | ORAL_TABLET | Freq: Every day | ORAL | 3 refills | Status: DC
  Filled 2022-09-13: qty 90, 90d supply, fill #0
  Filled 2023-05-13: qty 90, 90d supply, fill #1

## 2022-09-13 NOTE — Progress Notes (Unsigned)
Chief Complaint  Patient presents with   New Patient (Initial Visit)    Rm 14 alone. Pt reports she is here to discuss numbness in the left hand. Sx have been present since stroke in 2022. Left side primarily effected by the stroke. She also reports frequent headaches.       ASSESSMENT AND PLAN  Brandy Nguyen is a 56 y.o. 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   MEDICAL HISTORY:  Brandy Nguyen is a 56 year old female, seen in request by her primary care physician nurse practitioner Grayce Sessions, for evaluation of numbness of the left hand, initial evaluation September 13, 2022  I reviewed and summarized the referring note. PMHx. HTN HLD Smoke 1ppd Seizure  She suffered stroke in June 2022, presenting with left-sided weakness, seizure-like activity, personally reviewed MRI of the brain with without contrast on March 10, 2021, large acute or early subacute right posterior MCA infarction cortical edema, extensive petechiae hemorrhage, gyriform enhancement likely related to blood-brain barrier breakdown, UDS was positive for cocaine Echocardiogram hyperdynamic no wall regional motion abnormality CT angiogram head and neck no large vessel disease   Hospital admission in March 2023 for partial seizure, left-sided body jerking movement, Todd's paralysis, EEG was unremarkable, Laboratory evaluation showed UDS positive for cocaine, benzodiazepines She was started on Keppra during hospital stay, experienced psychosis with aggressive behavior, but psychosis resolved by day 3 of hospitalization, was seen by  neurohospitalist, was switched to Depakote DR 500 mg twice a day, Personally reviewed MRI of the brain, no acute abnormality, right posterior MCA infarction, encephalomalacia  She continues frustrated by her left-sided body symptoms, weakness, sensory loss, not compliant with her Depakote DR  PHYSICAL EXAM:   Vitals:   09/13/22 1609  BP: 136/85  Pulse: 73  Weight: 155 lb (70.3 kg)  Height: 4\' 11"  (1.499 m)   Not recorded     Body mass index is 31.31 kg/m.  PHYSICAL EXAMNIATION:  Gen: NAD, conversant, well nourised, well groomed                     Cardiovascular: Regular rate rhythm, no peripheral edema, warm, nontender. Eyes: Conjunctivae clear without exudates or hemorrhage Neck: Supple, no carotid bruits. Pulmonary: Clear to auscultation bilaterally   NEUROLOGICAL EXAM:  MENTAL STATUS: Speech/cognition: Awake, alert, oriented to history taking and casual conversation CRANIAL NERVES: CN II: Left hemivisual field deficit. Pupils are round equal and briskly reactive to light. CN III, IV, VI: extraocular movement are normal. No ptosis. CN V: Facial sensation is intact to light touch CN VII: Face is symmetric with normal eye closure  CN VIII: Hearing is normal to causal conversation. CN IX, X: Phonation is normal. CN XI: Head turning and shoulder shrug are intact  MOTOR: .  Pronation drift of left upper extremity, fixation of left arm on rapid rotating movement, mild left leg drift  REFLEXES: Hyperreflexia of left upper and lower extremity  SENSORY: Intact to light touch, pinprick and vibratory sensation are intact in fingers and toes.  COORDINATION: There is no trunk or limb dysmetria noted.  GAIT/STANCE: Need push-up to get up  from sitting position, tends to hold left elbow flexion, dragging left leg mildly  REVIEW OF SYSTEMS:  Full 14 system review of systems performed and notable only for as above All other review of systems were  negative.   ALLERGIES: No Known Allergies  HOME MEDICATIONS: Current Outpatient Medications  Medication Sig Dispense Refill   amLODipine (NORVASC) 10 MG tablet Take 1 tablet (10 mg total) by mouth daily. 90 tablet 1   aspirin 81 MG chewable tablet Chew 1 tablet (81 mg total) by mouth daily.     atorvastatin (LIPITOR) 40 MG tablet Take 1 tablet (40 mg total) by mouth once daily. 90 tablet 1   divalproex (DEPAKOTE) 500 MG DR tablet Take 1 tablet (500 mg total) by mouth 2 (two) times daily. 60 tablet 5   Melatonin ER 5 MG TBCR Take 1 tablet before bedtime if does not work may take additional 1 . Do not take more then 10 mg at night 90 tablet 0   No current facility-administered medications for this visit.    PAST MEDICAL HISTORY: Past Medical History:  Diagnosis Date   Hyperlipidemia 03/11/2021    PAST SURGICAL HISTORY: Past Surgical History:  Procedure Laterality Date   TUBAL LIGATION      FAMILY HISTORY: Family History  Problem Relation Age of Onset   Hypertension Mother    Dementia Mother     SOCIAL HISTORY: Social History   Socioeconomic History   Marital status: Married    Spouse name: Not on file   Number of children: Not on file   Years of education: Not on file   Highest education level: Not on file  Occupational History   Not on file  Tobacco Use   Smoking status: Unknown    Passive exposure: Current   Smokeless tobacco: Current  Substance and Sexual Activity   Alcohol use: Yes   Drug use: Yes    Types: Cocaine   Sexual activity: Not on file  Other Topics Concern   Not on file  Social History Narrative   Right handed    Caffeine rare    Social Determinants of Health   Financial Resource Strain: Not on file  Food Insecurity: Not on file  Transportation Needs: Not on file  Physical Activity: Not on file  Stress: Not on file  Social Connections: Not on file  Intimate Partner Violence: Not on file      Levert Feinstein, M.D. Ph.D.  [facility] [address], [address] [place], [place] [address] Ph: [phone] Fax: [phone]  CC:  Grayce Sessions, NP [address] [place],  [place] [address]  Grayce Sessions, NP

## 2022-09-14 ENCOUNTER — Telehealth: Payer: Self-pay | Admitting: Neurology

## 2022-09-14 LAB — VALPROIC ACID LEVEL: Valproic Acid Lvl: <4 ug/mL — ABNORMAL LOW (ref 50–100)

## 2022-09-14 LAB — CBC WITH DIFFERENTIAL/PLATELET
Basophils Absolute: 0 10*3/uL (ref 0.0–0.2)
Basos: 1 %
EOS (ABSOLUTE): 0.2 10*3/uL (ref 0.0–0.4)
Eos: 3 %
Hematocrit: 44.9 % (ref 34.0–46.6)
Hemoglobin: 14.8 g/dL (ref 11.1–15.9)
Immature Grans (Abs): 0 10*3/uL (ref 0.0–0.1)
Immature Granulocytes: 0 %
Lymphocytes Absolute: 2 10*3/uL (ref 0.7–3.1)
Lymphs: 35 %
MCH: 30.6 pg (ref 26.6–33.0)
MCHC: 33 g/dL (ref 31.5–35.7)
MCV: 93 fL (ref 79–97)
Monocytes Absolute: 0.6 10*3/uL (ref 0.1–0.9)
Monocytes: 11 %
Neutrophils Absolute: 2.8 10*3/uL (ref 1.4–7.0)
Neutrophils: 50 %
Platelets: 247 10*3/uL (ref 150–450)
RBC: 4.83 x10E6/uL (ref 3.77–5.28)
RDW: 12.7 % (ref 11.7–15.4)
WBC: 5.6 10*3/uL (ref 3.4–10.8)

## 2022-09-14 LAB — COMPREHENSIVE METABOLIC PANEL
ALT: 18 IU/L (ref 0–32)
AST: 21 IU/L (ref 0–40)
Albumin/Globulin Ratio: 1.6 (ref 1.2–2.2)
Albumin: 4 g/dL (ref 3.8–4.9)
Alkaline Phosphatase: 83 IU/L (ref 44–121)
BUN/Creatinine Ratio: 11 (ref 9–23)
BUN: 13 mg/dL (ref 6–24)
Bilirubin Total: <0.2 mg/dL (ref 0.0–1.2)
CO2: 24 mmol/L (ref 20–29)
Calcium: 9.7 mg/dL (ref 8.7–10.2)
Chloride: 107 mmol/L — ABNORMAL HIGH (ref 96–106)
Creatinine, Ser: 1.19 mg/dL — ABNORMAL HIGH (ref 0.57–1.00)
Globulin, Total: 2.5 g/dL (ref 1.5–4.5)
Glucose: 76 mg/dL (ref 70–99)
Potassium: 4.3 mmol/L (ref 3.5–5.2)
Sodium: 144 mmol/L (ref 134–144)
Total Protein: 6.5 g/dL (ref 6.0–8.5)
eGFR: 53 mL/min/{1.73_m2} — ABNORMAL LOW (ref >59)

## 2022-09-14 LAB — TSH: TSH: 1.43 u[IU]/mL (ref 0.450–4.500)

## 2022-09-14 LAB — HGB A1C W/O EAG: Hgb A1c MFr Bld: 5.8 % — ABNORMAL HIGH (ref 4.8–5.6)

## 2022-09-14 NOTE — Telephone Encounter (Signed)
Please call patient, laboratory evaluation showed  --- Mild elevation of A1c 5.8, prediabetes, you would benefit weight control, moderate exercise  --- Decreased valproic acid level less than 4, indicating she has not been compliant with her Depakote, please emphasized importance of taking her medicine regularly to avoid recurrent seizure  --- Mild elevated creatinine 1.19, she would benefit increase water intake  --- Rest of the laboratory evaluation showed no significant abnormalities.

## 2022-09-15 NOTE — Telephone Encounter (Signed)
I attempted pt and she was not available. Vm not working.  Will try again at a later time.

## 2022-09-16 ENCOUNTER — Other Ambulatory Visit: Payer: Self-pay

## 2022-09-20 NOTE — Telephone Encounter (Signed)
I attempted to reach the pt for the second time ( Vm not set up or accepting new messages)  I will mail a copy of lab results to the pt.

## 2022-10-13 ENCOUNTER — Ambulatory Visit (INDEPENDENT_AMBULATORY_CARE_PROVIDER_SITE_OTHER): Payer: Medicaid Other | Admitting: Neurology

## 2022-10-13 DIAGNOSIS — I639 Cerebral infarction, unspecified: Secondary | ICD-10-CM

## 2022-10-13 DIAGNOSIS — G40909 Epilepsy, unspecified, not intractable, without status epilepticus: Secondary | ICD-10-CM

## 2022-10-27 NOTE — Procedures (Signed)
   HISTORY: 57 year old female with history of large right MCA stroke, complex partial seizure  TECHNIQUE:  This is a routine 16 channel EEG recording with one channel devoted to a limited EKG recording.  It was performed during wakefulness, drowsiness and asleep.  Hyperventilation and photic stimulation were performed as activating procedures.  There are frequent eye blinking and muscle artifact.  Upon maximum arousal, posterior dominant waking rhythm consistent of rhythmic alpha range activity. Activities are symmetric over the bilateral posterior derivations and attenuated with eye opening.  There are noticeable dysrhythmia slowing at right frontal parietal region.  Hyperventilation produced mild/moderate buildup with higher amplitude and the slower activities noted.  Photic stimulation did not alter the tracing.  During EEG recording, patient developed drowsiness and no deeper stage of sleep was achieved  During EEG recording, there was no epileptiform discharge noted.  EKG demonstrate normal sinus rhythm.    CONCLUSION: This is an abnormal awake EEG.  There is evidence of right F8, T8, P8 slowing, indicating right frontal parietal focal irritability.  There is no evidence of epileptiform discharge.  Marcial Pacas, M.D. Ph.D.  [facility] [place],  [address] Phone: [phone] Fax:      [phone]

## 2022-11-16 DIAGNOSIS — Z0271 Encounter for disability determination: Secondary | ICD-10-CM

## 2022-12-15 ENCOUNTER — Telehealth (INDEPENDENT_AMBULATORY_CARE_PROVIDER_SITE_OTHER): Payer: Self-pay | Admitting: Primary Care

## 2022-12-15 NOTE — Telephone Encounter (Signed)
Attempted to reach patient about needing to reschedule her appointment because Sharyn Lull will not be in the office that day. Left a voice mail for her to call and reschedule.

## 2023-01-11 ENCOUNTER — Other Ambulatory Visit (HOSPITAL_COMMUNITY): Payer: Self-pay

## 2023-02-07 ENCOUNTER — Encounter (INDEPENDENT_AMBULATORY_CARE_PROVIDER_SITE_OTHER): Payer: Medicaid Other | Admitting: Primary Care

## 2023-02-08 ENCOUNTER — Telehealth (INDEPENDENT_AMBULATORY_CARE_PROVIDER_SITE_OTHER): Payer: Self-pay

## 2023-02-08 NOTE — Telephone Encounter (Signed)
Tried reaching out to pt in regards to her appt for tomorrow pt didn't answer lvm for pt to reach out to [place] to reschedule her physical

## 2023-02-09 ENCOUNTER — Encounter (INDEPENDENT_AMBULATORY_CARE_PROVIDER_SITE_OTHER): Payer: Medicaid Other | Admitting: Primary Care

## 2023-02-16 ENCOUNTER — Telehealth (INDEPENDENT_AMBULATORY_CARE_PROVIDER_SITE_OTHER): Payer: Self-pay | Admitting: Primary Care

## 2023-02-16 NOTE — Telephone Encounter (Signed)
Called pt to remind them about their upcoming apt.

## 2023-02-17 ENCOUNTER — Encounter (INDEPENDENT_AMBULATORY_CARE_PROVIDER_SITE_OTHER): Payer: Self-pay | Admitting: Primary Care

## 2023-02-17 ENCOUNTER — Other Ambulatory Visit (HOSPITAL_COMMUNITY)
Admission: RE | Admit: 2023-02-17 | Discharge: 2023-02-17 | Disposition: A | Discharge status: Home / Self Care | Payer: Medicaid Other | Source: Ambulatory Visit | Attending: Primary Care | Admitting: Primary Care

## 2023-02-17 ENCOUNTER — Ambulatory Visit (INDEPENDENT_AMBULATORY_CARE_PROVIDER_SITE_OTHER): Payer: Medicaid Other | Admitting: Primary Care

## 2023-02-17 VITALS — BP 109/67 | HR 66 | Resp 16 | Ht 60.0 in | Wt 158.2 lb

## 2023-02-17 DIAGNOSIS — Z124 Encounter for screening for malignant neoplasm of cervix: Secondary | ICD-10-CM

## 2023-02-17 DIAGNOSIS — Z1211 Encounter for screening for malignant neoplasm of colon: Secondary | ICD-10-CM

## 2023-02-17 DIAGNOSIS — Z1231 Encounter for screening mammogram for malignant neoplasm of breast: Secondary | ICD-10-CM

## 2023-02-17 DIAGNOSIS — Z23 Encounter for immunization: Secondary | ICD-10-CM

## 2023-02-17 NOTE — Progress Notes (Signed)
  [facility]  WELL-WOMAN PHYSICAL & PAP Patient name: Brandy Nguyen MRN [id number]  Date of birth: [birth year] Chief Complaint:   Annual Exam and Gynecologic Exam  History of Present Illness:   Brandy Nguyen is a 57 y.o. No obstetric history on file. female being seen today for a routine well-woman exam.   CC:  The current method of family planning is none.  No LMP recorded. Patient is postmenopausal. Last pap unknown.  Last mammogram: ordered.. Family h/o breast cancer: No Last colonoscopy: ordered. Family h/o colorectal cancer: Yes  Review of Systems:    Denies any headaches, blurred vision, fatigue, shortness of breath, chest pain, abdominal pain, abnormal vaginal discharge/itching/odor/irritation, problems with periods, bowel movements, urination, or intercourse unless otherwise stated above.  Pertinent History Reviewed:   Reviewed past medical,surgical, social and family history.  Reviewed problem list, medications and allergies.  Physical Assessment:   Vitals:   02/17/23 1031  BP: 109/67  Pulse: 66  Resp: 16  SpO2: 100%  Weight: 158 lb 3.2 oz (71.8 kg)  Height: 5' (1.524 m)  Body mass index is 30.9 kg/m.        Physical Examination:  General appearance - well appearing, and in no distress Mental status - alert, oriented to person, place, and time Psych:  She has a normal mood and affect Skin - warm and dry, normal color, no suspicious lesions noted Chest - effort normal, all lung fields clear to auscultation bilaterally Heart - normal rate and regular rhythm Neck:  midline trachea, no thyromegaly or nodules Breasts - breasts appear normal, no suspicious masses, no skin or nipple changes or axillary nodes Educated patient on proper self breast examination and had patient to demonstrate SBE. Abdomen - soft, nontender, nondistended, no masses or organomegaly Pelvic-VULVA: normal appearing vulva with no masses, tenderness or lesions   VAGINA:  normal appearing vagina with normal color and discharge, no lesions   CERVIX: normal appearing cervix without discharge or lesions, no CMT UTERUS: uterus is felt to be normal size, shape, consistency and nontender  ADNEXA: No adnexal masses or tenderness noted. Extremities:  No swelling or varicosities noted  No results found for this or any previous visit (from the past 24 hour(s)).   Assessment & Plan:  Brandy Nguyen was seen today for annual exam and gynecologic exam.  Diagnoses and all orders for this visit:  Cervical cancer screening -     Cervicovaginal ancillary only -     Cytology - PAP  Encounter for screening mammogram for malignant neoplasm of breast -     MM DIGITAL SCREENING BILATERAL; Future  Colon cancer screening -     Ambulatory referral to Gastroenterology  Other orders -     Varicella-zoster vaccine IM -     Tdap vaccine greater than or equal to 7yo IM    Orders Placed This Encounter  Procedures   MM DIGITAL SCREENING BILATERAL   Varicella-zoster vaccine IM   Tdap vaccine greater than or equal to 7yo IM   Ambulatory referral to Gastroenterology    Meds: No orders of the defined types were placed in this encounter.   Follow-up: Return in about 6 months (around 08/20/2023) for medical conditions.  This note has been created with Education officer, environmental. Any transcriptional errors are unintentional.   Grayce Sessions, NP 02/17/2023, 4:27 PM

## 2023-02-21 LAB — CERVICOVAGINAL ANCILLARY ONLY
Bacterial Vaginitis (gardnerella): NEGATIVE
Candida Glabrata: NEGATIVE
Candida Vaginitis: NEGATIVE
Chlamydia: NEGATIVE
Comment: NEGATIVE
Comment: NEGATIVE
Comment: NEGATIVE
Comment: NEGATIVE
Comment: NEGATIVE
Comment: NORMAL
Neisseria Gonorrhea: NEGATIVE
Trichomonas: NEGATIVE

## 2023-02-22 LAB — CYTOLOGY - PAP
Adequacy: ABSENT
Comment: NEGATIVE
Diagnosis: NEGATIVE
High risk HPV: NEGATIVE

## 2023-03-22 ENCOUNTER — Encounter: Payer: Self-pay | Admitting: Internal Medicine

## 2023-03-28 NOTE — Patient Instructions (Incomplete)
Below is our plan:  Goals:  1) Maintain strict control of hypertension with blood pressure goal below 130/90 2) Maintain good control of diabetes with hemoglobin A1c goal below 7%  3) Maintain good control of lipids with LDL cholesterol goal below 70 mg/dL.  4) Eat a healthy diet with plenty of whole grains, cereals, fruits and vegetables, exercise regularly and maintain ideal body weight   Resources: https://www.williams.biz/  Please make sure you are consistent with timing of seizure medication. I recommend annual visit with primary care provider (PCP) for complete physical and routine blood work. I recommend daily intake of vitamin D (400-800iu) and calcium (800-1000mg ) for bone health. Discuss Dexa screening with PCP.   According to  law, you can not drive unless you are seizure / syncope free for at least 6 months and under physician's care.  Please maintain precautions. Do not participate in activities where a loss of awareness could harm you or someone else. No swimming alone, no tub bathing, no hot tubs, no driving, no operating motorized vehicles (cars, ATVs, motocycles, etc), lawnmowers, power tools or firearms. No standing at heights, such as rooftops, ladders or stairs. Avoid hot objects such as stoves, heaters, open fires. Wear a helmet when riding a bicycle, scooter, skateboard, etc. and avoid areas of traffic. Set your water heater to 120 degrees or less.  SUDEP is the sudden, unexpected death of someone with epilepsy, who was otherwise healthy. In SUDEP cases, no other cause of death is found when an autopsy is done. Each year, more than 1 in 1,000 people with epilepsy die from SUDEP. This is the leading cause of death in people with uncontrolled seizures. Until further answers are available, the best way to prevent SUDEP is to lower your risk by controlling seizures. Research has found that people with all types of  epilepsy that experience convulsive seizures can be at risk.  Please make sure you are staying well hydrated. I recommend 50-60 ounces daily. Well balanced diet and regular exercise encouraged. Consistent sleep schedule with 6-8 hours recommended.   Please continue follow up with care team as directed.   Follow up with *** in ***  You may receive a survey regarding today's visit. I encourage you to leave honest feed back as I do use this information to improve patient care. Thank you for seeing me today!

## 2023-03-28 NOTE — Progress Notes (Unsigned)
No chief complaint on file.   HISTORY OF PRESENT ILLNESS:  03/28/23 ALL:  Brandy Nguyen is a 57 y.o. 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 She was seen in consult with Dr Terrace Arabia 09/2022. EEG unremarkable. She was missing ASM with BID dosing and was switched to 1000mg  at bedtime.    HISTORY (copied from Dr Zannie Cove previous note)   Brandy Nguyen is a 57 year old female, seen in request by her primary care physician nurse practitioner Grayce Sessions, for evaluation of numbness of the left hand, initial evaluation September 13, 2022   I reviewed and summarized the referring note. PMHx. HTN HLD Smoke 1ppd Seizure   She suffered stroke in June 2022, presenting with left-sided weakness, seizure-like activity, personally reviewed MRI of the brain with without contrast on March 10, 2021, large acute or early subacute right posterior MCA infarction cortical edema, extensive petechiae hemorrhage, gyriform enhancement likely related to blood-brain barrier breakdown, UDS was positive for cocaine Echocardiogram hyperdynamic no wall regional motion abnormality CT angiogram head and neck no large vessel disease   Hospital admission in March 2023 for partial seizure, left-sided body jerking movement, Todd's paralysis, EEG was unremarkable, Laboratory evaluation showed UDS positive for cocaine, benzodiazepines She was started on Keppra during hospital stay, experienced psychosis with aggressive behavior, but psychosis resolved by day 3 of hospitalization, was seen by neurohospitalist, was switched to Depakote DR 500 mg twice a day, Personally reviewed MRI of the brain, no acute abnormality, right posterior MCA infarction, encephalomalacia   She continues frustrated by her left-sided body symptoms, weakness, sensory loss, not compliant with her Depakote DR   REVIEW OF SYSTEMS: Out of a complete 14 system review of symptoms, the patient complains only  of the following symptoms, and all other reviewed systems are negative.   ALLERGIES: No Known Allergies   HOME MEDICATIONS: Outpatient Medications Prior to Visit  Medication Sig Dispense Refill   amLODipine (NORVASC) 10 MG tablet Take 1 tablet (10 mg total) by mouth daily. 90 tablet 3   aspirin 81 MG chewable tablet Chew 1 tablet (81 mg total) by mouth daily.     atorvastatin (LIPITOR) 40 MG tablet Take 1 tablet (40 mg total) by mouth once daily. 90 tablet 3   divalproex (DEPAKOTE ER) 500 MG 24 hr tablet Take 2 tablets (1,000 mg total) by mouth at bedtime. 180 tablet 3   divalproex (DEPAKOTE) 500 MG DR tablet Take 1 tablet (500 mg total) by mouth 2 (two) times daily. 60 tablet 5   docusate sodium (COLACE) 100 MG capsule Take 1 capsule (100 mg total) by mouth 2 (two) times daily. 100 capsule 11   Melatonin ER 5 MG TBCR Take 1 tablet before bedtime if does not work may take additional 1 . Do not take more then 10 mg at night 90 tablet 0   No facility-administered medications prior to visit.     PAST MEDICAL HISTORY: Past Medical History:  Diagnosis Date   Hyperlipidemia 03/11/2021     PAST SURGICAL HISTORY: Past Surgical History:  Procedure Laterality Date   TUBAL LIGATION       FAMILY HISTORY: Family History  Problem Relation Age of Onset   Hypertension Mother    Dementia Mother      SOCIAL HISTORY: Social History   Socioeconomic History   Marital status: Married    Spouse name: Not on file   Number of children: Not on file  Years of education: Not on file   Highest education level: Not on file  Occupational History   Not on file  Tobacco Use   Smoking status: Unknown    Passive exposure: Current   Smokeless tobacco: Current  Substance and Sexual Activity   Alcohol use: Yes   Drug use: Yes    Types: Cocaine   Sexual activity: Not on file  Other Topics Concern   Not on file  Social History Narrative   Right handed    Caffeine rare    Social  Determinants of Health   Financial Resource Strain: Not on file  Food Insecurity: Not on file  Transportation Needs: Not on file  Physical Activity: Not on file  Stress: Not on file  Social Connections: Not on file  Intimate Partner Violence: Not on file     PHYSICAL EXAM  There were no vitals filed for this visit. There is no height or weight on file to calculate BMI.  Generalized: Well developed, in no acute distress  Cardiology: normal rate and rhythm, no murmur auscultated  Respiratory: clear to auscultation bilaterally    Neurological examination  Mentation: Alert oriented to time, place, history taking. Follows all commands speech and language fluent Cranial nerve II-XII: Pupils were equal round reactive to light. Extraocular movements were full, visual field were full on confrontational test. Facial sensation and strength were normal. Uvula tongue midline. Head turning and shoulder shrug  were normal and symmetric. Motor: The motor testing reveals 5 over 5 strength of all 4 extremities. Good symmetric motor tone is noted throughout.  Sensory: Sensory testing is intact to soft touch on all 4 extremities. No evidence of extinction is noted.  Coordination: Cerebellar testing reveals good finger-nose-finger and heel-to-shin bilaterally.  Gait and station: Gait is normal. Tandem gait is normal. Romberg is negative. No drift is seen.  Reflexes: Deep tendon reflexes are symmetric and normal bilaterally.    DIAGNOSTIC DATA (LABS, IMAGING, TESTING) - I reviewed patient records, labs, notes, testing and imaging myself where available.  Lab Results  Component Value Date   WBC 5.6 09/13/2022   HGB 14.8 09/13/2022   HCT 44.9 09/13/2022   MCV 93 09/13/2022   PLT 247 09/13/2022      Component Value Date/Time   NA 144 09/13/2022 1632   K 4.3 09/13/2022 1632   CL 107 (H) 09/13/2022 1632   CO2 24 09/13/2022 1632   GLUCOSE 76 09/13/2022 1632   GLUCOSE 116 (H) 01/28/2022 1444    BUN 13 09/13/2022 1632   CREATININE 1.19 (H) 09/13/2022 1632   CALCIUM 9.7 09/13/2022 1632   PROT 6.5 09/13/2022 1632   ALBUMIN 4.0 09/13/2022 1632   AST 21 09/13/2022 1632   ALT 18 09/13/2022 1632   ALKPHOS 83 09/13/2022 1632   BILITOT <0.2 09/13/2022 1632   GFRNONAA 50 (L) 01/28/2022 1431   Lab Results  Component Value Date   CHOL 228 (H) 12/21/2021   HDL 89 12/21/2021   LDLCALC 122 (H) 12/21/2021   TRIG 83 12/21/2021   CHOLHDL 2.6 12/21/2021   Lab Results  Component Value Date   HGBA1C 5.8 (H) 09/13/2022   No results found for: "VITAMINB12" Lab Results  Component Value Date   TSH 1.430 09/13/2022        No data to display               No data to display           ASSESSMENT AND PLAN  57 y.o. year old female  has a past medical history of Hyperlipidemia (03/11/2021). here with    No diagnosis found.  Bernita Buffy ***.  Healthy lifestyle habits encouraged. *** will follow up with PCP as directed. *** will return to see me in ***, sooner if needed. *** verbalizes understanding and agreement with this plan.   No orders of the defined types were placed in this encounter.    No orders of the defined types were placed in this encounter.    Shawnie Dapper, MSN, FNP-C 03/28/2023, 1:29 PM  [facility] [address], [address] [place], [place] [address] [phone]

## 2023-03-29 ENCOUNTER — Ambulatory Visit (INDEPENDENT_AMBULATORY_CARE_PROVIDER_SITE_OTHER): Payer: Medicaid Other | Admitting: Family Medicine

## 2023-03-29 ENCOUNTER — Encounter: Payer: Self-pay | Admitting: Family Medicine

## 2023-03-29 VITALS — BP 119/76 | HR 58 | Ht 59.0 in | Wt 154.5 lb

## 2023-03-29 DIAGNOSIS — I639 Cerebral infarction, unspecified: Secondary | ICD-10-CM | POA: Diagnosis not present

## 2023-03-29 DIAGNOSIS — R0683 Snoring: Secondary | ICD-10-CM | POA: Diagnosis not present

## 2023-03-29 DIAGNOSIS — G40909 Epilepsy, unspecified, not intractable, without status epilepticus: Secondary | ICD-10-CM

## 2023-03-29 DIAGNOSIS — R0681 Apnea, not elsewhere classified: Secondary | ICD-10-CM | POA: Diagnosis not present

## 2023-03-29 DIAGNOSIS — R519 Headache, unspecified: Secondary | ICD-10-CM

## 2023-03-30 LAB — CBC WITH DIFFERENTIAL/PLATELET
Basophils Absolute: 0 10*3/uL (ref 0.0–0.2)
Basos: 0 %
EOS (ABSOLUTE): 0.2 10*3/uL (ref 0.0–0.4)
Eos: 3 %
Hematocrit: 44.4 % (ref 34.0–46.6)
Hemoglobin: 14.3 g/dL (ref 11.1–15.9)
Immature Grans (Abs): 0 10*3/uL (ref 0.0–0.1)
Immature Granulocytes: 0 %
Lymphocytes Absolute: 1.6 10*3/uL (ref 0.7–3.1)
Lymphs: 29 %
MCH: 31.5 pg (ref 26.6–33.0)
MCHC: 32.2 g/dL (ref 31.5–35.7)
MCV: 98 fL — ABNORMAL HIGH (ref 79–97)
Monocytes Absolute: 0.4 10*3/uL (ref 0.1–0.9)
Monocytes: 7 %
Neutrophils Absolute: 3.5 10*3/uL (ref 1.4–7.0)
Neutrophils: 61 %
Platelets: 212 10*3/uL (ref 150–450)
RBC: 4.54 x10E6/uL (ref 3.77–5.28)
RDW: 13.5 % (ref 11.7–15.4)
WBC: 5.6 10*3/uL (ref 3.4–10.8)

## 2023-03-30 LAB — COMPREHENSIVE METABOLIC PANEL
ALT: 16 IU/L (ref 0–32)
AST: 21 IU/L (ref 0–40)
Albumin: 4.2 g/dL (ref 3.8–4.9)
Alkaline Phosphatase: 93 IU/L (ref 44–121)
BUN/Creatinine Ratio: 5 — ABNORMAL LOW (ref 9–23)
BUN: 5 mg/dL — ABNORMAL LOW (ref 6–24)
Bilirubin Total: 0.4 mg/dL (ref 0.0–1.2)
CO2: 24 mmol/L (ref 20–29)
Calcium: 9.6 mg/dL (ref 8.7–10.2)
Chloride: 104 mmol/L (ref 96–106)
Creatinine, Ser: 1.05 mg/dL — ABNORMAL HIGH (ref 0.57–1.00)
Globulin, Total: 2.6 g/dL (ref 1.5–4.5)
Glucose: 74 mg/dL (ref 70–99)
Potassium: 4.2 mmol/L (ref 3.5–5.2)
Sodium: 142 mmol/L (ref 134–144)
Total Protein: 6.8 g/dL (ref 6.0–8.5)
eGFR: 61 mL/min/{1.73_m2} (ref >59)

## 2023-03-30 LAB — VALPROIC ACID LEVEL: Valproic Acid Lvl: 110 ug/mL — ABNORMAL HIGH (ref 50–100)

## 2023-04-13 ENCOUNTER — Other Ambulatory Visit: Payer: Self-pay

## 2023-04-13 ENCOUNTER — Ambulatory Visit (AMBULATORY_SURGERY_CENTER): Payer: Medicaid Other

## 2023-04-13 VITALS — Ht 59.0 in | Wt 154.0 lb

## 2023-04-13 DIAGNOSIS — Z1211 Encounter for screening for malignant neoplasm of colon: Secondary | ICD-10-CM

## 2023-04-13 MED ORDER — PEG 3350-KCL-NA BICARB-NACL 420 G PO SOLR
4000.0000 mL | Freq: Once | ORAL | 0 refills | Status: AC
  Filled 2023-04-13: qty 4000, 1d supply, fill #0

## 2023-04-13 NOTE — Progress Notes (Signed)

## 2023-04-26 ENCOUNTER — Encounter: Payer: Medicaid Other | Admitting: Internal Medicine

## 2023-04-26 ENCOUNTER — Telehealth: Payer: Self-pay | Admitting: Internal Medicine

## 2023-04-26 NOTE — Telephone Encounter (Signed)
Good Afternoon Dr. Leonides Schanz,  I called this patient at 10:46 to see if she was coming for her procedure.  No one answered so I l left a message on her voicemail, to call us if she was running late or if she had to reschedule.  I will NO SHOW this patient  Medicaid

## 2023-05-05 ENCOUNTER — Institutional Professional Consult (permissible substitution): Payer: Medicaid Other | Admitting: Neurology

## 2023-05-13 ENCOUNTER — Other Ambulatory Visit: Payer: Self-pay

## 2023-05-18 ENCOUNTER — Telehealth: Payer: Self-pay | Admitting: Family Medicine

## 2023-05-18 ENCOUNTER — Other Ambulatory Visit: Payer: Self-pay

## 2023-05-18 NOTE — Telephone Encounter (Signed)
Dr.Camara I called patient and relayed the below from Amy   Please triage. I saw her 6/24 and she reported tolerating divalproex. We discussed headaches and I was concerned of possible sleep apnea. I referred her to sleep med. Has she scheduled consult? If not, can we help her with that. Divalproex typically helps with headaches but we can always consider adjusting or switching med if we have to. I would like to rule out other causes of headaches first as she seemed to be doing well from a seizure standpoint. We can always bring her in to discuss prevention medicaitons if needed."    Dr. Teresa Coombs you are PM work in provider,Amy has left for today. She does have her sleep consult is scheduled on 06/21/23.  Patient said she has dreams and her head feels like its on fire,she does have headaches as well. She reports she eats salty foods and that maybe causing some of headaches. Pt said she is working on decreasing her salit intake. She asked if she can do 500 mg of divalproex daily verses taking 2 pills daily to see if this helps with her symptoms? Please advise

## 2023-05-18 NOTE — Telephone Encounter (Signed)
divalproex 1000mg   Since the dosage has been increased she wakes up with headaches and burning sensation at the top pf her head and back of her head. She wanted to talk to you about the side effects. Can you please call her to discuss.

## 2023-05-18 NOTE — Telephone Encounter (Signed)
Please have patient split the Depakote 500 mg in the morning and 500 mg a night and monitor for pain.

## 2023-05-19 NOTE — Telephone Encounter (Signed)
Patient returned call and verbalized understanding of taking 500 mg Depakotemin the morning and 500 mg Depakote at night and to call back if pain persists.

## 2023-05-19 NOTE — Telephone Encounter (Signed)
Call to patient, no answer. Left message to call office.

## 2023-06-02 ENCOUNTER — Ambulatory Visit
Admission: RE | Admit: 2023-06-02 | Discharge: 2023-06-02 | Disposition: A | Discharge status: Home / Self Care | Payer: Medicaid Other | Source: Ambulatory Visit | Attending: Primary Care | Admitting: Primary Care

## 2023-06-02 DIAGNOSIS — Z1231 Encounter for screening mammogram for malignant neoplasm of breast: Secondary | ICD-10-CM | POA: Diagnosis not present

## 2023-06-13 IMAGING — CT CT HEAD W/O CM
3 of 4 series · 15 of 47 positions shown, 18 images · non-contrast
Comparison: CT head 12/20/2021

CLINICAL DATA: Altered mental status



[Series 3: head 5.0 h30s · axial · 0.43mm/px · z∈[-127,+13]mm · 9 of 34 slices shown, 12 images]
[im 3/34  brain]
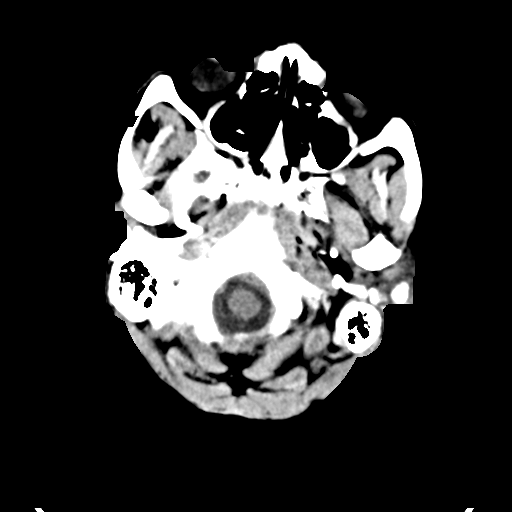
[im 3/34  bone]
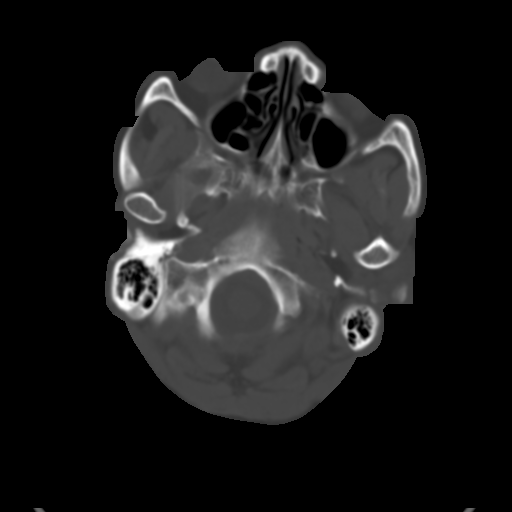
[im 8/34  brain]
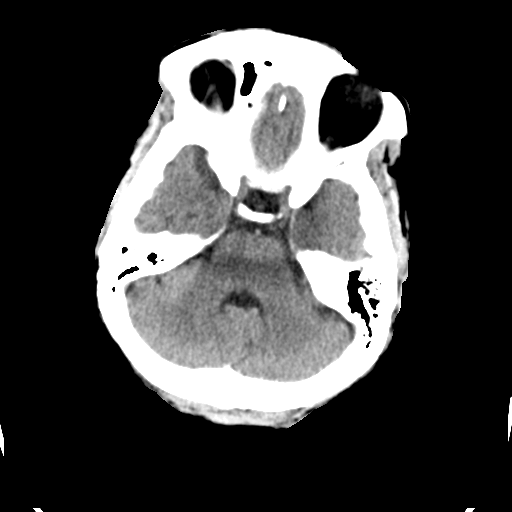
[im 10/34  brain]
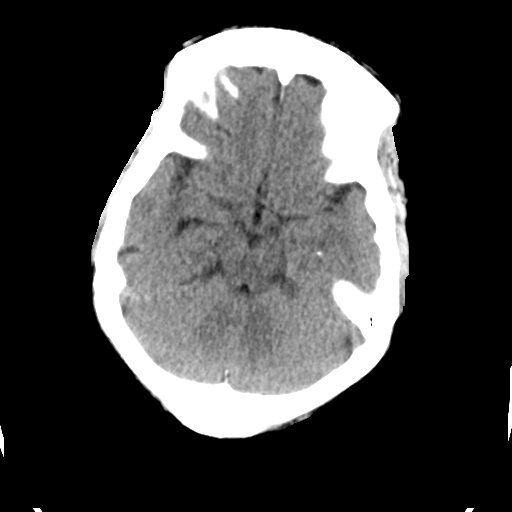
[im 15/34  brain]
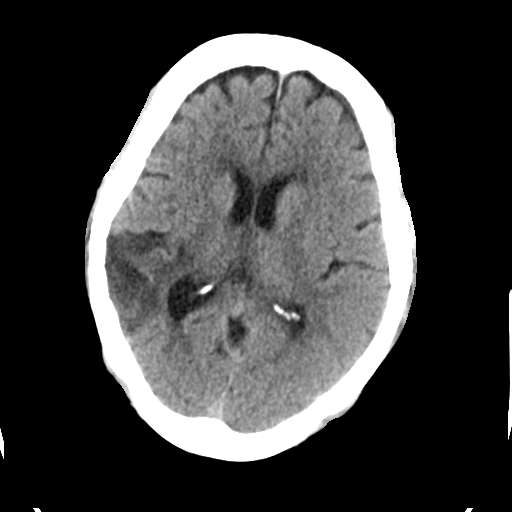
[im 17/34  brain]
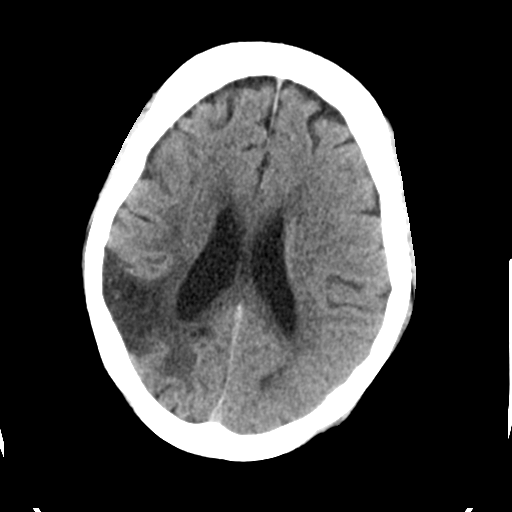
[im 17/34  bone]
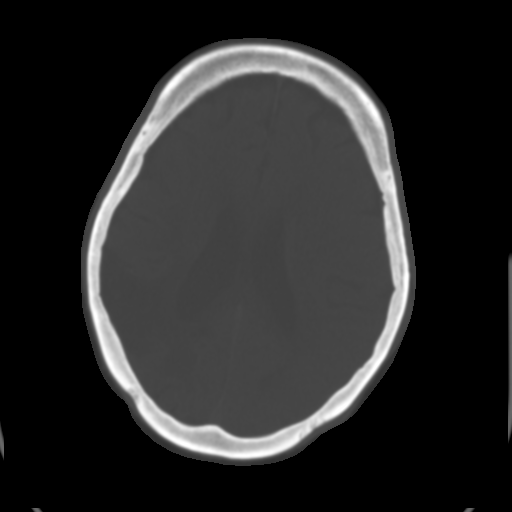
[im 19/34  brain]
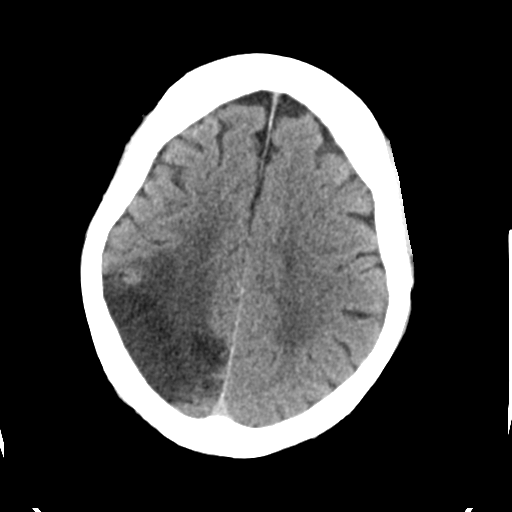
[im 24/34  brain]
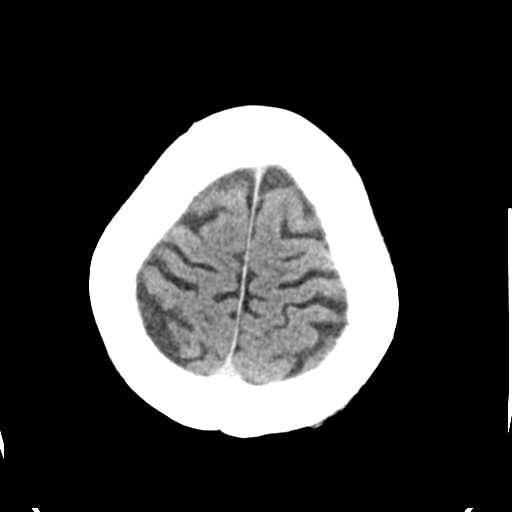
[im 26/34  brain]
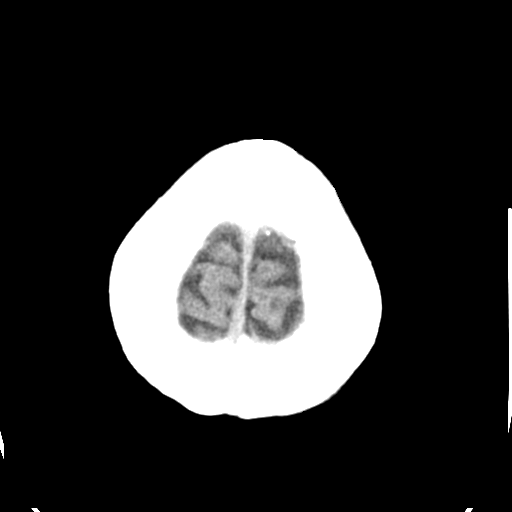
[im 31/34  brain]
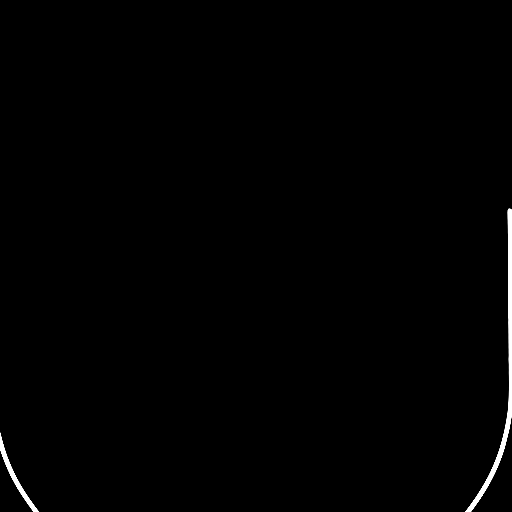
[im 31/34  bone]
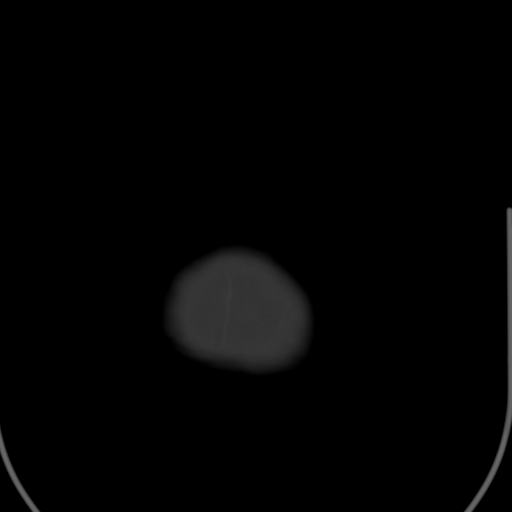

[Series 5: head 3.0 mpr cor · coronal · 0.35mm/px · 3 of 70 slices shown]
[im 24/70  brain]
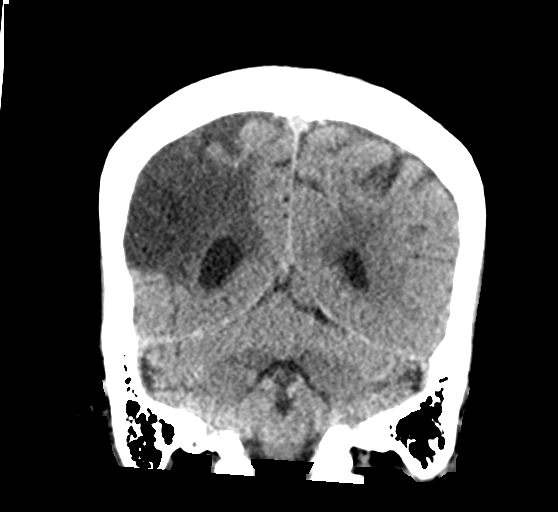
[im 31/70  brain]
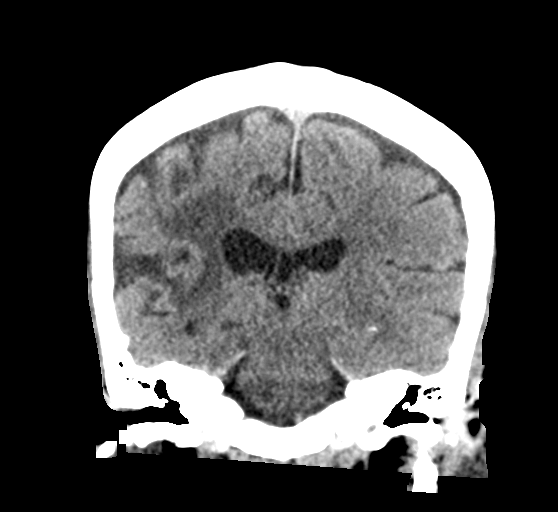
[im 39/70  brain]
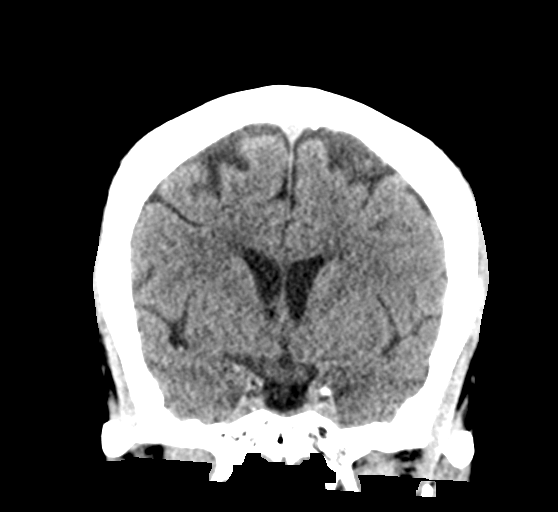

[Series 6: head 3.0 mpr sag · sagittal · 0.33mm/px · 3 of 66 slices shown]
[im 22/66  brain]
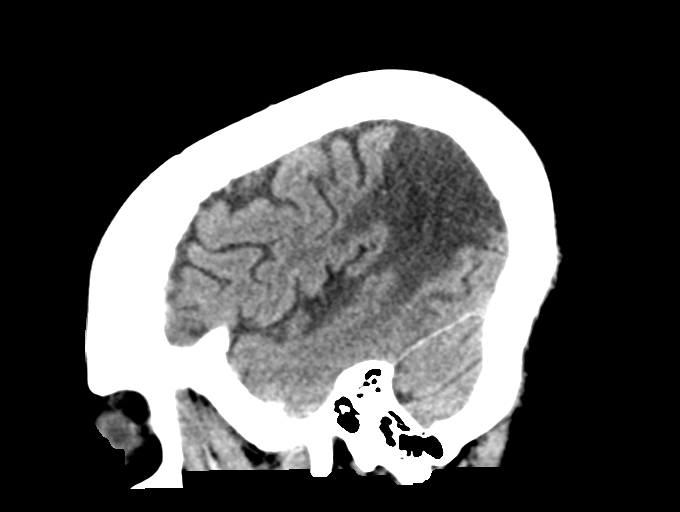
[im 33/66  brain]
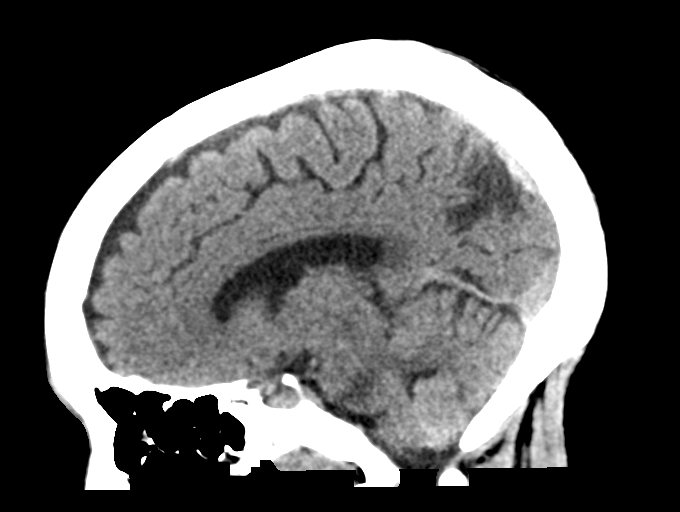
[im 44/66  brain]
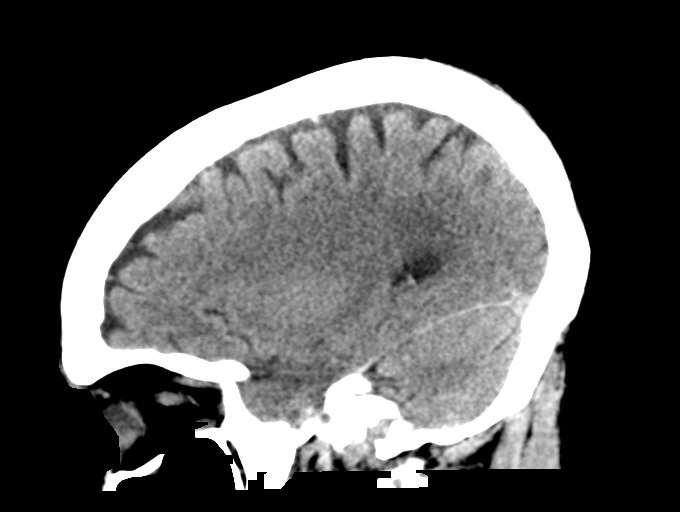

[15 of 47 positions shown; findings below may reference images not displayed]

FINDINGS: Brain: No acute intracranial hemorrhage, mass effect, or herniation.
No extra-axial fluid collections. No evidence of acute territorial
infarct. No hydrocephalus. Area of encephalomalacia in the right
posterior MCA territory from previous infarct. Patchy hypodensities
in the periventricular and subcortical white matter, likely
secondary to chronic microvascular ischemic changes.

Vascular: No hyperdense vessel or unexpected calcification.

Skull: Normal. Negative for fracture or focal lesion.

Sinuses/Orbits: No acute finding.

Other: None.
IMPRESSION: No acute intracranial process identified. Chronic changes including
a remote right posterior MCA territory infarct.

## 2023-06-13 IMAGING — MR MR HEAD W/O CM
8 of 10 series · 36 of 48 positions shown · non-contrast
Comparison: 12/20/2021

CLINICAL DATA: Mental status change, unknown cause

EXAM:
MRI HEAD WITHOUT CONTRAST
TECHNIQUE: Multiplanar, multiecho pulse sequences of the brain and surrounding
structures were obtained without intravenous contrast.

[Series 3: DWI · axial · 3.0mm · 1.09mm/px · z∈[-126,+13]mm · 9 of 104 slices shown (1 of 4)]
[im 1/104]
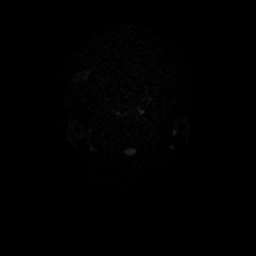
[im 13/104]
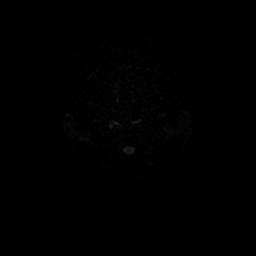
[im 26/104]
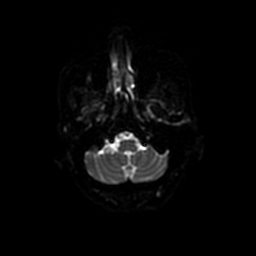
[im 39/104]
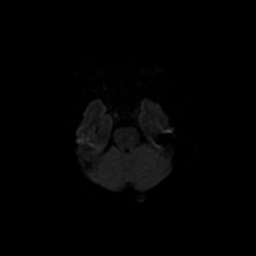
[im 52/104]
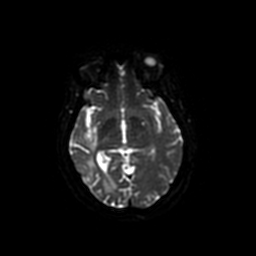
[im 65/104]
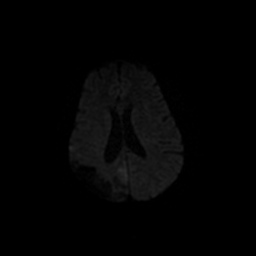
[im 78/104]
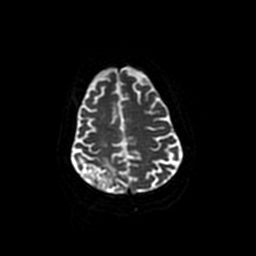
[im 91/104]
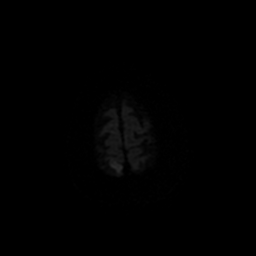
[im 104/104]
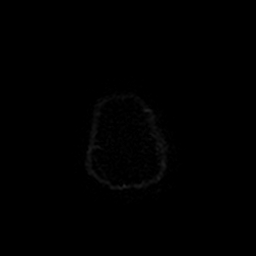

[Series 4: T1 · sagittal · 5.0mm · 0.47mm/px · 2 of 25 slices shown]
[im 1/25]
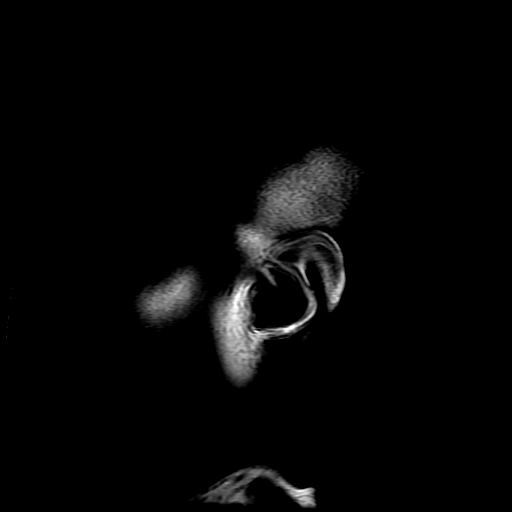
[im 25/25]
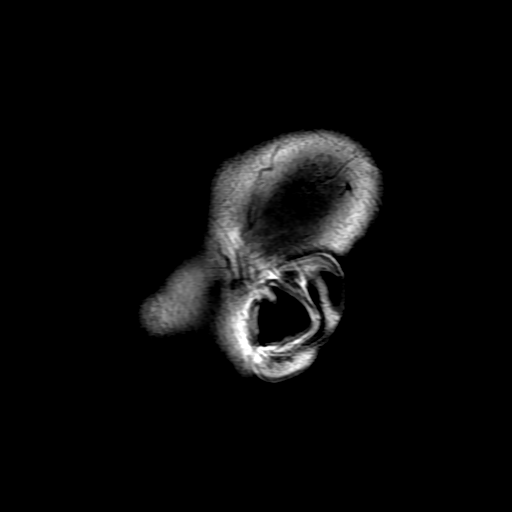

[Series 5: DWI · coronal · 5.0mm · 1.09mm/px · 7 of 76 slices shown (2 of 4)]
[im 1/76]
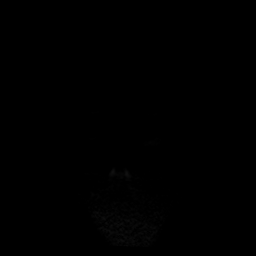
[im 13/76]
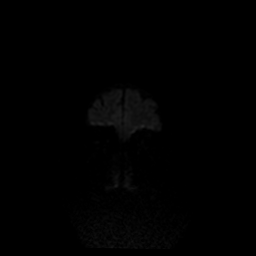
[im 26/76]
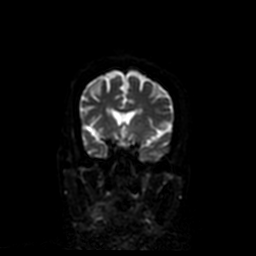
[im 38/76]
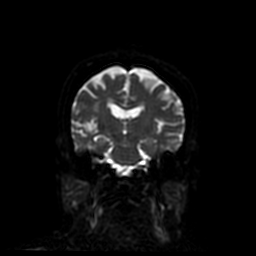
[im 51/76]
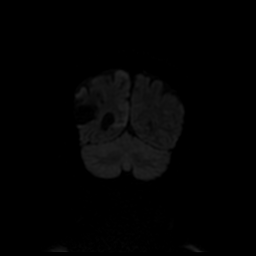
[im 63/76]
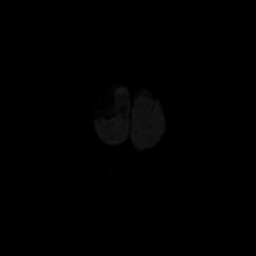
[im 76/76]
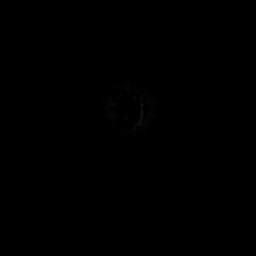

[Series 6: FLAIR · axial · 3.0mm · 0.45mm/px · z∈[-115,+20]mm · 3 of 26 slices shown]
[im 1/26]
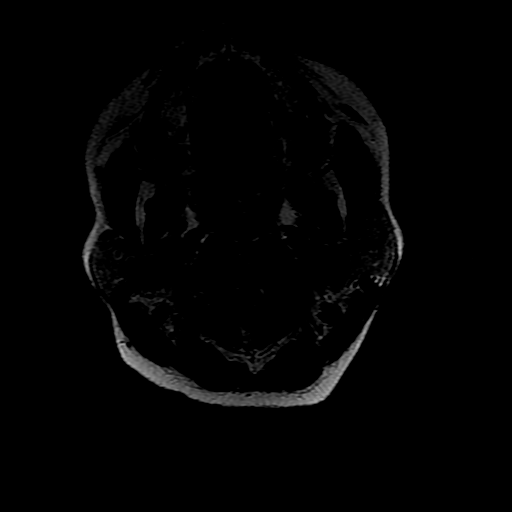
[im 13/26]
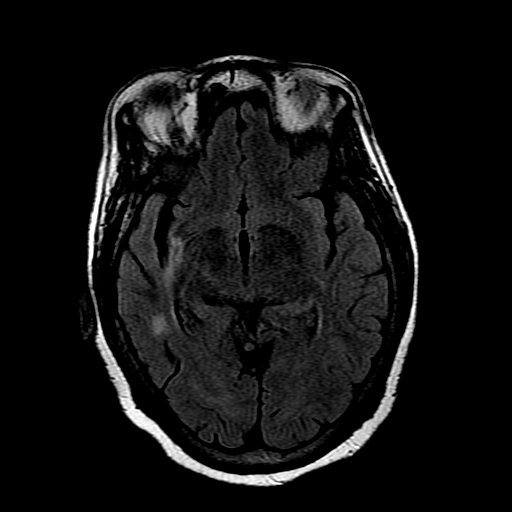
[im 26/26]
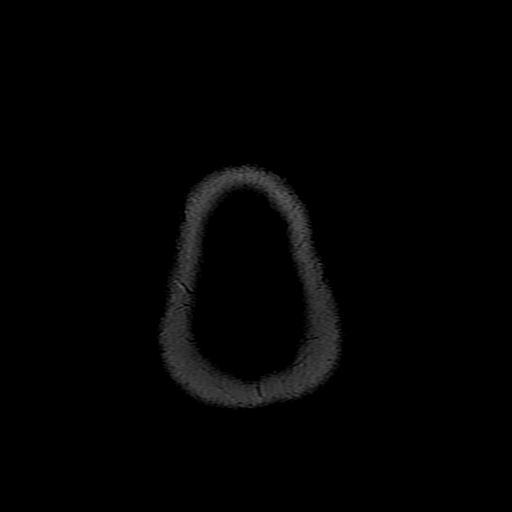

[Series 8: T2 · axial · 5.0mm · 0.43mm/px · z∈[-113,+23]mm · 3 of 26 slices shown (1 of 2)]
[im 1/26]
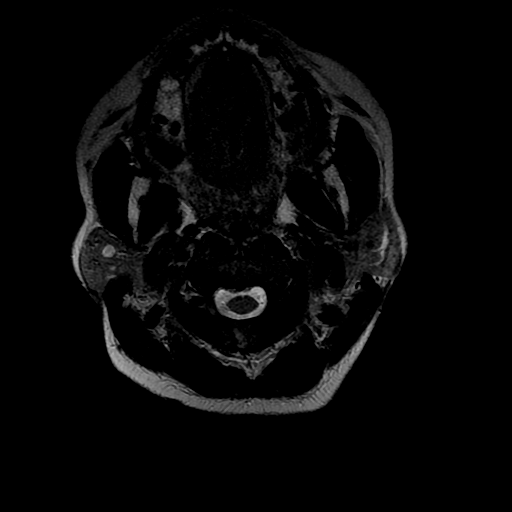
[im 13/26]
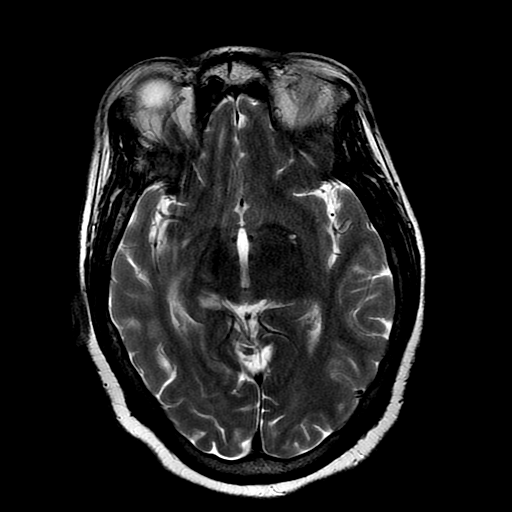
[im 26/26]
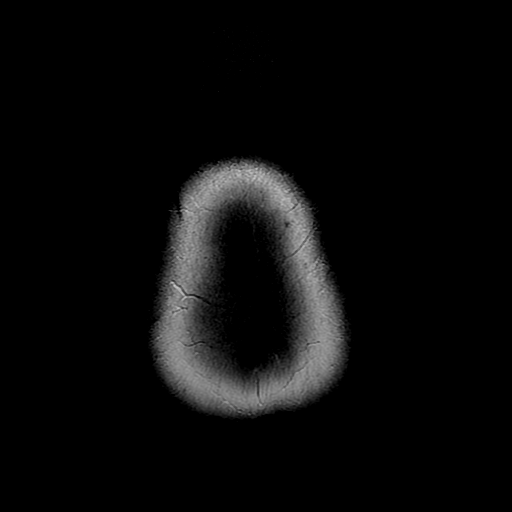

[Series 10: T2 · coronal · 5.0mm · 0.39mm/px · 3 of 28 slices shown (2 of 2)]
[im 1/28]
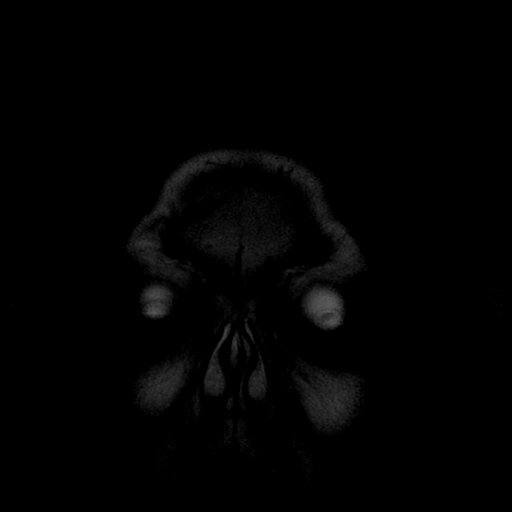
[im 14/28]
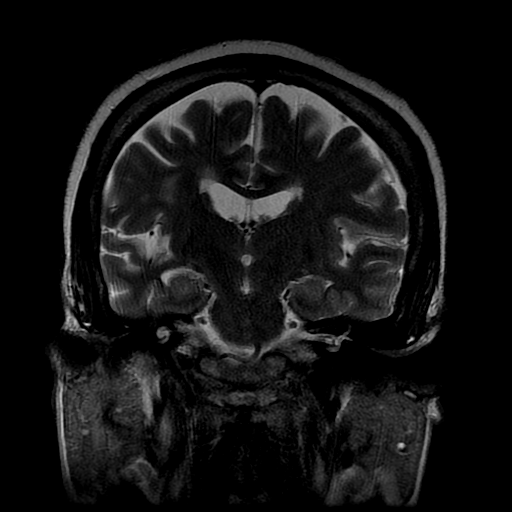
[im 28/28]
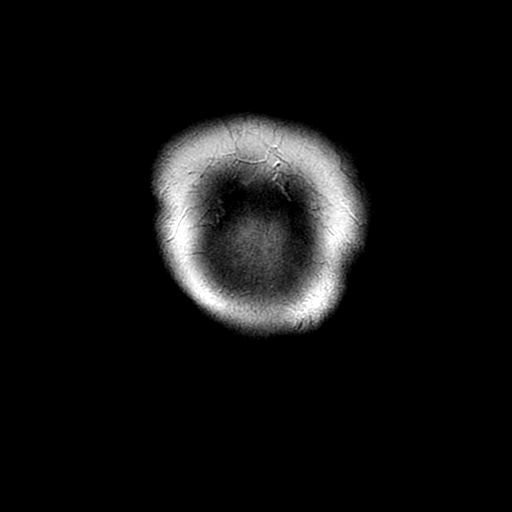

[Series 300: DWI · axial · 3.0mm · 1.09mm/px · z∈[-126,+13]mm · 5 of 52 slices shown (3 of 4)]
[im 1/52]
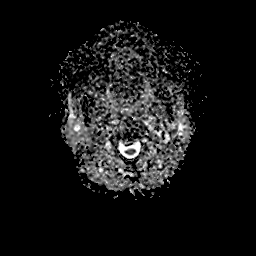
[im 13/52]
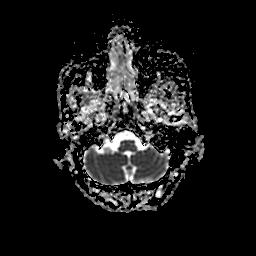
[im 26/52]
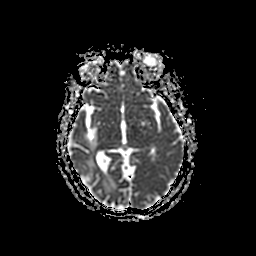
[im 39/52]
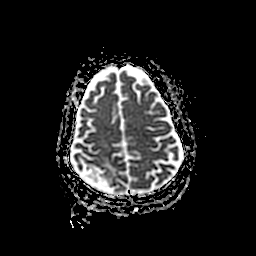
[im 52/52]
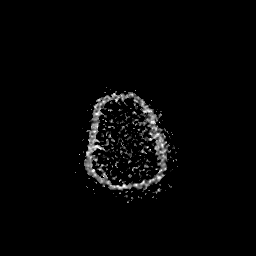

[Series 500: DWI · coronal · 5.0mm · 1.09mm/px · 4 of 38 slices shown (4 of 4)]
[im 1/38]
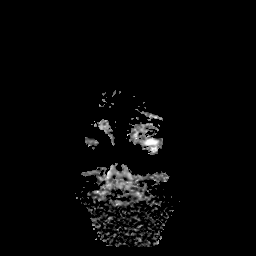
[im 13/38]
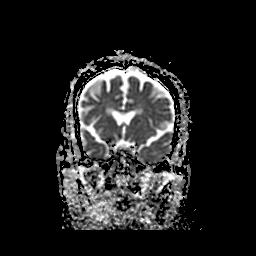
[im 25/38]
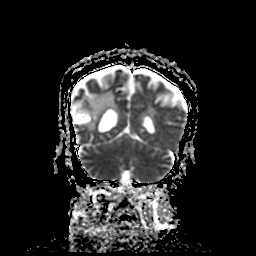
[im 38/38]
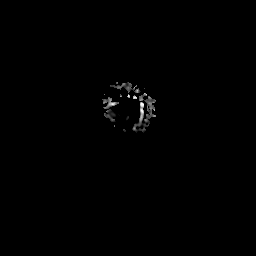

[36 of 48 positions shown; findings below may reference images not displayed]

FINDINGS: Brain: Cortical diffusion hyperintensity in the parasagittal right
frontoparietal lobes without corresponding T2 hyperintensity. There
is also some similar more lateral right parietal cortical
involvement.

There is no intracranial mass, mass effect, or edema. Chronic right
cerebral infarct involving parietal, occipital, and temporal lobes
as well as the posterior insula with chronic blood products and ex
vacuo dilatation of the adjacent right lateral ventricle. Additional
patchy and confluent areas of T2 hyperintensity in the
supratentorial white matter are nonspecific but may reflect stable
chronic microvascular ischemic changes. Ventricles are stable in
size. There is no hydrocephalus or extra-axial fluid collection.

Vascular: Major vessel flow voids at the skull base are preserved.

Skull and upper cervical spine: Normal marrow signal is preserved.

Sinuses/Orbits: Minor mucosal thickening.  Orbits are unremarkable.

Other: Sella is unremarkable.  Mastoid air cells are clear.
IMPRESSION: Cortical diffusion hyperintensity in the parasagittal right
frontoparietal lobes and possibly the more lateral right parietal
lobe as well. There is no corresponding T2 hyperintensity and a
somewhat similar appearance on the prior study suggest this is
artifactual. Seizure effect is also a consideration in the
appropriate setting.

Large chronic right posterior MCA territory infarct.

## 2023-06-21 ENCOUNTER — Encounter: Payer: Self-pay | Admitting: Neurology

## 2023-06-21 ENCOUNTER — Ambulatory Visit: Payer: Medicaid Other | Admitting: Neurology

## 2023-06-21 ENCOUNTER — Other Ambulatory Visit: Payer: Self-pay

## 2023-06-21 VITALS — BP 143/89 | HR 75 | Ht 59.0 in | Wt 154.8 lb

## 2023-06-21 DIAGNOSIS — I63511 Cerebral infarction due to unspecified occlusion or stenosis of right middle cerebral artery: Secondary | ICD-10-CM

## 2023-06-21 DIAGNOSIS — G40909 Epilepsy, unspecified, not intractable, without status epilepticus: Secondary | ICD-10-CM

## 2023-06-21 DIAGNOSIS — Z72821 Inadequate sleep hygiene: Secondary | ICD-10-CM

## 2023-06-21 DIAGNOSIS — G4719 Other hypersomnia: Secondary | ICD-10-CM

## 2023-06-21 MED ORDER — ALPRAZOLAM 0.25 MG PO TABS
0.2500 mg | ORAL_TABLET | Freq: Every evening | ORAL | 0 refills | Status: AC | PRN
  Filled 2023-06-21: qty 2, 2d supply, fill #0

## 2023-06-21 NOTE — Patient Instructions (Signed)
Screening for Sleep Apnea  Sleep apnea is a condition in which breathing pauses or becomes shallow during sleep. Sleep apnea screening is a test to determine if you are at risk for sleep apnea. The test includes a series of questions. It will only takes a few minutes. Your health care provider may ask you to have this test in preparation for surgery or as part of a physical exam. What are the symptoms of sleep apnea? Common symptoms of sleep apnea include: Snoring. Waking up often at night. Daytime sleepiness. Pauses in breathing. Choking or gasping during sleep. Irritability. Forgetfulness. Trouble thinking clearly. Depression. Personality changes. Most people with sleep apnea do not know that they have it. What are the advantages of sleep apnea screening? Getting screened for sleep apnea can help: Ensure your safety. It is important for your health care providers to know whether or not you have sleep apnea, especially if you are having surgery or have other long-term (chronic) health conditions. Improve your health and allow you to get a better night's rest. Restful sleep can help you: Have more energy. Lose weight. Improve high blood pressure. Improve diabetes management. Prevent stroke. Prevent car accidents. What happens during the screening? Screening usually includes being asked a list of questions about your sleep quality. Some questions you may be asked include: Do you snore? Is your sleep restless? Do you have daytime sleepiness? Has a partner or spouse told you that you stop breathing during sleep? Have you had trouble concentrating or memory loss? What is your age? What is your neck circumference? To measure your neck, keep your back straight and gently wrap the tape measure around your neck. Put the tape measure at the middle of your neck, between your chin and collarbone. What is your sex assigned at birth? Do you have or are you being treated for high blood  pressure? If your screening test is positive, you are at risk for the condition. Further testing may be needed to confirm a diagnosis of sleep apnea. Where to find more information You can find screening tools online or at your health care clinic. For more information about sleep apnea screening and healthy sleep, visit these websites: Centers for Disease Control and Prevention: FootballExhibition.com.br American Sleep Apnea Association: www.sleepapnea.org Contact a health care provider if: You think that you may have sleep apnea. Summary Sleep apnea screening can help determine if you are at risk for sleep apnea. It is important for your health care providers to know whether or not you have sleep apnea, especially if you are having surgery or have other chronic health conditions. You may be asked to take a screening test for sleep apnea in preparation for surgery or as part of a physical exam. This information is not intended to replace advice given to you by your health care provider. Make sure you discuss any questions you have with your health care provider. Document Revised: 08/29/2020 Document Reviewed: 08/29/2020 Elsevier Patient Education  2024 [employer] Quality Sleep Information, Adult Quality sleep is important for your mental and physical health. It also improves your quality of life. Quality sleep means you: Are asleep for most of the time you are in bed. Fall asleep within 30 minutes. Wake up no more than once a night. Are awake for no longer than 20 minutes if you do wake up during the night. Most adults need 7-8 hours of quality sleep each night. How can poor sleep affect me? If you do not get enough quality sleep,  you may have: Mood swings. Daytime sleepiness. Decreased alertness, reaction time, and concentration. Sleep disorders, such as insomnia and sleep apnea. Difficulty with: Solving problems. Coping with stress. Paying attention. These issues may affect your performance  and productivity at work, school, and home. Lack of sleep may also put you at higher risk for accidents, suicide, and risky behaviors. If you do not get quality sleep, you may also be at higher risk for several health problems, including: Infections. Type 2 diabetes. Heart disease. High blood pressure. Obesity. Worsening of long-term conditions, like arthritis, kidney disease, depression, Parkinson's disease, and epilepsy. What actions can I take to get more quality sleep? Sleep schedule and routine Stick to a sleep schedule. Go to sleep and wake up at about the same time each day. Do not try to sleep less on weekdays and make up for lost sleep on weekends. This does not work. Limit naps during the day to 30 minutes or less. Do not take naps in the late afternoon. Make time to relax before bed. Reading, listening to music, or taking a hot bath promotes quality sleep. Make your bedroom a place that promotes quality sleep. Keep your bedroom dark, quiet, and at a comfortable room temperature. Make sure your bed is comfortable. Avoid using electronic devices that give off bright blue light for 30 minutes before bedtime. Your brain perceives bright blue light as sunlight. This includes television, phones, and computers. If you are lying awake in bed for longer than 20 minutes, get up and do a relaxing activity until you feel sleepy. Lifestyle     Try to get at least 30 minutes of exercise on most days. Do not exercise 2-3 hours before going to bed. Do not use any products that contain nicotine or tobacco. These products include cigarettes, chewing tobacco, and vaping devices, such as e-cigarettes. If you need help quitting, ask your health care provider. Do not drink caffeinated beverages for at least 8 hours before going to bed. Coffee, tea, and some sodas contain caffeine. Do not drink alcohol or eat large meals close to bedtime. Try to get at least 30 minutes of sunlight every day. Morning  sunlight is best. Medical concerns Work with your health care provider to treat medical conditions that may affect sleeping, such as: Nasal obstruction. Snoring. Sleep apnea and other sleep disorders. Talk to your health care provider if you think any of your prescription medicines may cause you to have difficulty falling or staying asleep. If you have sleep problems, talk with a sleep consultant. If you think you have a sleep disorder, talk with your health care provider about getting evaluated by a specialist. Where to find more information Sleep Foundation: sleepfoundation.org American Academy of Sleep Medicine: aasm.org Centers for Disease Control and Prevention (CDC): TonerPromos.no Contact a health care provider if: You have trouble getting to sleep or staying asleep. You often wake up very early in the morning and cannot get back to sleep. You have daytime sleepiness. You have daytime sleep attacks of suddenly falling asleep and sudden muscle weakness (narcolepsy). You have a tingling sensation in your legs with a strong urge to move your legs (restless legs syndrome). You stop breathing briefly during sleep (sleep apnea). You think you have a sleep disorder or are taking a medicine that is affecting your quality of sleep. Summary Most adults need 7-8 hours of quality sleep each night. Getting enough quality sleep is important for your mental and physical health. Make your bedroom a place that  promotes quality sleep, and avoid things that may cause you to have poor sleep, such as alcohol, caffeine, smoking, or large meals. Talk to your health care provider if you have trouble falling asleep or staying asleep. This information is not intended to replace advice given to you by your health care provider. Make sure you discuss any questions you have with your health care provider. Document Revised: 01/13/2022 Document Reviewed: 01/13/2022 Elsevier Patient Education  2024 [employer]  Inc. Insomnia Insomnia is a sleep disorder that makes it difficult to fall asleep or stay asleep. Insomnia can cause fatigue, low energy, difficulty concentrating, mood swings, and poor performance at work or school. There are three different ways to classify insomnia: Difficulty falling asleep. Difficulty staying asleep. Waking up too early in the morning. Any type of insomnia can be long-term (chronic) or short-term (acute). Both are common. Short-term insomnia usually lasts for 3 months or less. Chronic insomnia occurs at least three times a week for longer than 3 months. What are the causes? Insomnia may be caused by another condition, situation, or substance, such as: Having certain mental health conditions, such as anxiety and depression. Using caffeine, alcohol, tobacco, or drugs. Having gastrointestinal conditions, such as gastroesophageal reflux disease (GERD). Having certain medical conditions. These include: Asthma. Alzheimer's disease. Stroke. Chronic pain. An overactive thyroid gland (hyperthyroidism). Other sleep disorders, such as restless legs syndrome and sleep apnea. Menopause. Sometimes, the cause of insomnia may not be known. What increases the risk? Risk factors for insomnia include: Gender. Females are affected more often than males. Age. Insomnia is more common as people get older. Stress and certain medical and mental health conditions. Lack of exercise. Having an irregular work schedule. This may include working night shifts and traveling between different time zones. What are the signs or symptoms? If you have insomnia, the main symptom is having trouble falling asleep or having trouble staying asleep. This may lead to other symptoms, such as: Feeling tired or having low energy. Feeling nervous about going to sleep. Not feeling rested in the morning. Having trouble concentrating. Feeling irritable, anxious, or depressed. How is this diagnosed? This  condition may be diagnosed based on: Your symptoms and medical history. Your health care provider may ask about: Your sleep habits. Any medical conditions you have. Your mental health. A physical exam. How is this treated? Treatment for insomnia depends on the cause. Treatment may focus on treating an underlying condition that is causing the insomnia. Treatment may also include: Medicines to help you sleep. Counseling or therapy. Lifestyle adjustments to help you sleep better. Follow these instructions at home: Eating and drinking  Limit or avoid alcohol, caffeinated beverages, and products that contain nicotine and tobacco, especially close to bedtime. These can disrupt your sleep. Do not eat a large meal or eat spicy foods right before bedtime. This can lead to digestive discomfort that can make it hard for you to sleep. Sleep habits  Keep a sleep diary to help you and your health care provider figure out what could be causing your insomnia. Write down: When you sleep. When you wake up during the night. How well you sleep and how rested you feel the next day. Any side effects of medicines you are taking. What you eat and drink. Make your bedroom a dark, comfortable place where it is easy to fall asleep. Put up shades or blackout curtains to block light from outside. Use a white noise machine to block noise. Keep the temperature cool. Limit screen  use before bedtime. This includes: Not watching TV. Not using your smartphone, tablet, or computer. Stick to a routine that includes going to bed and waking up at the same times every day and night. This can help you fall asleep faster. Consider making a quiet activity, such as reading, part of your nighttime routine. Try to avoid taking naps during the day so that you sleep better at night. Get out of bed if you are still awake after 15 minutes of trying to sleep. Keep the lights down, but try reading or doing a quiet activity. When you  feel sleepy, go back to bed. General instructions Take over-the-counter and prescription medicines only as told by your health care provider. Exercise regularly as told by your health care provider. However, avoid exercising in the hours right before bedtime. Use relaxation techniques to manage stress. Ask your health care provider to suggest some techniques that may work well for you. These may include: Breathing exercises. Routines to release muscle tension. Visualizing peaceful scenes. Make sure that you drive carefully. Do not drive if you feel very sleepy. Keep all follow-up visits. This is important. Contact a health care provider if: You are tired throughout the day. You have trouble in your daily routine due to sleepiness. You continue to have sleep problems, or your sleep problems get worse. Get help right away if: You have thoughts about hurting yourself or someone else. Get help right away if you feel like you may hurt yourself or others, or have thoughts about taking your own life. Go to your nearest emergency room or: Call 911. Call the National Suicide Prevention Lifeline at 510-499-6605 or 988. This is open 24 hours a day. Text the Crisis Text Line at (575)355-2381. Summary Insomnia is a sleep disorder that makes it difficult to fall asleep or stay asleep. Insomnia can be long-term (chronic) or short-term (acute). Treatment for insomnia depends on the cause. Treatment may focus on treating an underlying condition that is causing the insomnia. Keep a sleep diary to help you and your health care provider figure out what could be causing your insomnia. This information is not intended to replace advice given to you by your health care provider. Make sure you discuss any questions you have with your health care provider. Document Revised: 08/31/2021 Document Reviewed: 08/31/2021 Elsevier Patient Education  2024 [employer].

## 2023-06-21 NOTE — Progress Notes (Signed)
SLEEP MEDICINE CLINIC    Provider:  Melvyn Novas, MD  Primary Care Physician:  Grayce Sessions, NP [id number] [place] [address]     Referring Provider: dr Terrace Arabia          Chief Complaint according to patient   Patient presents with:     New Patient (Initial Visit)           HISTORY OF PRESENT ILLNESS:  Brandy Nguyen is a 57 y.o. female patient who is seen upon referral on 06/21/2023 from Dr Terrace Arabia for a sleep evaluation. .  Chief concern according to patient :  I am very sleepy, fatigue is high, I wake up for the bathroom 4-5  times, and I am in pain- neck and left arm and shoulder. Sometimes shooting pain into legs and needle stings in my feet.    I have the pleasure of seeing RHANIYA CABADA 06/21/23 a right-handed AA female with a possible sleep disorder.    The patient had seen Shawnie Dapper, NP in June : She suffered stroke in June 2022, presenting with left-sided weakness, seizure-like activity, personally reviewed MRI of the brain with without contrast on March 10, 2021, large acute or early subacute right posterior MCA infarction cortical edema, extensive petechiae hemorrhage, gyriform enhancement likely related to blood-brain barrier breakdown, UDS was positive for cocaine Echocardiogram hyperdynamic no wall regional motion abnormality CT angiogram head and neck no large vessel disease   Hospital admission in March 2023 for partial seizure, left-sided body jerking movement, Todd's paralysis, EEG was unremarkable, Laboratory evaluation showed UDS positive for cocaine, benzodiazepines She was started on Keppra during hospital stay, experienced psychosis with aggressive behavior, but psychosis resolved by day 3 of hospitalization, was seen by neurohospitalist, was switched to Depakote DR 500 mg twice a day, Personally reviewed MRI of the brain, no acute abnormality, right posterior MCA infarction, encephalomalacia   She continues frustrated by her left-sided  body symptoms, weakness, sensory loss, not compliant with her Depakote DR 03/29/23 ALL:  Bernita Buffy is a 57 y.o. 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 She was seen in consult with Dr Terrace Arabia 09/2022. EEG unremarkable. She was missing ASM with BID dosing and was switched to 1000mg  at bedtime.    Since, she reports doing fairly well. No seizure events since last visit. She is tolerating meds. She is unable to tell me what dose but states taking 2 pills every night.    She does tell me that she is having headaches. Unsure when this started. She described an occasional burning but constant throbbing sensation across the frontal portion of her head. Most are present in the morning. Ibuprofen or Advil usually help but most of the time headache returns. She does snore. She wakes at night feeling like she isn't breathing. She does not drink much water. She tries to drink 1 Gatorade bottle daily. She likes decaf tea and coffee.     BP is usually well managed. She continues atorvastatin 40mg  and asa 81mg  daily.    She reports brain fog since stroke. She lives alone. She has a sister who checks on her regularly. She does not drive. She is able to manage finances and medications. Her mother had dementia.      Sleep relevant medical history: Nocturia, Dreaming vividly, no ENT/  Tonsillectomy, cervical spine whiplash, DDD.  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     Family medical /sleep history:  dementia - in her mother affecting her from age 64, died age 50 other family member on CPAP with OSA, insomnia, sleep walkers.    Social history:  Patient is disabled /retired from [employer]-  and lives in a household alone, no pets . The patient used to work in shifts( night/ rotating,) Tobacco use; 2-3 cigarettes/ day.  ETOH use; none , no Caffeine intake . Exercise in form of walking .   Hobbies :none     Sleep habits are as follows: The patient's dinner time  is between 6-7 PM. The patient goes to bed at 9-10 PM and continues to struggles to sleep for several hours, she has TV in the bedroom-  and it runs all  night !!! She wakes for many bathroom breaks, the first time after one hour of sleep AM.   The preferred sleep position is supine, with the support of 2 pillows. Dreams are reportedly frequent/vivid.   The patient wakes up spontaneously about every hour without an alarm.  She is up from 2 AM until 5 AM and watches TV (!!!)  she will again sleep until 7AM . 8 AM is the usual rise time. She reports not feeling refreshed or restored in AM, with symptoms such as dry mouth, morning headaches, and residual fatigue. Naps are taken frequently, she basically dozes off all day and lacks stimulation.    Review of Systems: Out of a complete 14 system review, the patient complains of only the following symptoms, and all other reviewed systems are negative.:  Fatigue, sleepiness ,  fragmented sleep, Insomnia- no sleep hygiene , Nocturia    How likely are you to doze in the following situations: 0 = not likely, 1 = slight chance, 2 = moderate chance, 3 = high chance   Sitting and Reading? Watching Television? Sitting inactive in a public place (theater or meeting)? As a passenger in a car for an hour without a break? Lying down in the afternoon when circumstances permit? Sitting and talking to someone? Sitting quietly after lunch without alcohol? In a car, while stopped for a few minutes in traffic?   Total = 16/ 24 points , sleeping all day and not sleeping at night.  FSS endorsed at 60/ 63 points.   Before her stroke she slept like a "log"    Social History   Socioeconomic History   Marital status: Married    Spouse name: Separated    Number of children: 2 adult sons    Years of education: Not on file   Highest education level: Not on file  Occupational History   Not on file  Tobacco Use   Smoking status: Some Days    Current packs/day:  0.25    Types: Cigarettes    Passive exposure: Current   Smokeless tobacco: Current  Substance and Sexual Activity   Alcohol use: Yes   Drug use: Yes    Types: Cocaine   Sexual activity: Not on file  Other Topics Concern   Not on file  Social History Narrative   Right handed    Caffeine rare    Social Determinants of Health   Financial Resource Strain: Not on file  Food Insecurity: Not on file  Transportation Needs: Not on file  Physical Activity: Not on file  Stress: Not on file  Social Connections: Not on file    Family History  Problem Relation Age of Onset   Hypertension Mother    Dementia Mother  Colon cancer Neg Hx    Colon polyps Neg Hx    Esophageal cancer Neg Hx    Rectal cancer Neg Hx    Stomach cancer Neg Hx     Past Medical History:  Diagnosis Date   Anxiety    Asthma    Hyperlipidemia 03/11/2021   Hypertension  Cocaine use      Past Surgical History:  Procedure Laterality Date   TUBAL LIGATION       Current Outpatient Medications on File Prior to Visit  Medication Sig Dispense Refill   amLODipine (NORVASC) 10 MG tablet Take 1 tablet (10 mg total) by mouth daily. 90 tablet 3   aspirin 81 MG chewable tablet Chew 1 tablet (81 mg total) by mouth daily.     atorvastatin (LIPITOR) 40 MG tablet Take 1 tablet (40 mg total) by mouth once daily. 90 tablet 3   divalproex (DEPAKOTE ER) 500 MG 24 hr tablet Take 2 tablets (1,000 mg total) by mouth at bedtime. 180 tablet 3   docusate sodium (COLACE) 100 MG capsule Take 1 capsule (100 mg total) by mouth 2 (two) times daily. 100 capsule 11   No current facility-administered medications on file prior to visit.       DIAGNOSTIC DATA (LABS, IMAGING, TESTING) - I reviewed patient records, labs, notes, testing and imaging myself where available.  Lab Results  Component Value Date   WBC 5.6 03/29/2023   HGB 14.3 03/29/2023   HCT 44.4 03/29/2023   MCV 98 (H) 03/29/2023   PLT 212 03/29/2023       Component Value Date/Time   NA 142 03/29/2023 1043   K 4.2 03/29/2023 1043   CL 104 03/29/2023 1043   CO2 24 03/29/2023 1043   GLUCOSE 74 03/29/2023 1043   GLUCOSE 116 (H) 01/28/2022 1444   BUN 5 (L) 03/29/2023 1043   CREATININE 1.05 (H) 03/29/2023 1043   CALCIUM 9.6 03/29/2023 1043   PROT 6.8 03/29/2023 1043   ALBUMIN 4.2 03/29/2023 1043   AST 21 03/29/2023 1043   ALT 16 03/29/2023 1043   ALKPHOS 93 03/29/2023 1043   BILITOT 0.4 03/29/2023 1043   GFRNONAA 50 (L) 01/28/2022 1431   Lab Results  Component Value Date   CHOL 228 (H) 12/21/2021   HDL 89 12/21/2021   LDLCALC 122 (H) 12/21/2021   TRIG 83 12/21/2021   CHOLHDL 2.6 12/21/2021   Lab Results  Component Value Date   HGBA1C 5.8 (H) 09/13/2022   No results found for: "VITAMINB12" Lab Results  Component Value Date   TSH 1.430 09/13/2022    PHYSICAL EXAM:  Today's Vitals   06/21/23 0914  BP: (!) 143/89  Pulse: 75  Weight: 154 lb 12.8 oz (70.2 kg)  Height: 4\' 11"  (1.499 m)   Body mass index is 31.27 kg/m.   Wt Readings from Last 3 Encounters:  06/21/23 154 lb 12.8 oz (70.2 kg)  04/13/23 154 lb (69.9 kg)  03/29/23 154 lb 8 oz (70.1 kg)     Ht Readings from Last 3 Encounters:  06/21/23 4\' 11"  (1.499 m)  04/13/23 4\' 11"  (1.499 m)  03/29/23 4\' 11"  (1.499 m)      General: The patient is awake, alert and appears not in acute distress. The patient is well groomed. Head: Normocephalic, atraumatic. Neck is supple.  Mallampati 3 plus (4),  neck circumference:14.5 inches . Nasal airflow is patent.  Retrognathia is  seen.  Dental status: bioligical  Cardiovascular:  Regular rate and cardiac rhythm  by pulse,  without distended neck veins. Respiratory: Lungs are clear to auscultation.  Skin:  Without evidence of ankle edema, or rash.  Trunk: The patient's posture is erect.   NEUROLOGIC EXAM: The patient is awake and alert, oriented to place and time.   Memory subjective described as intact.  Attention span  & concentration ability appears normal.  Speech is fluent,  without  dysarthria, dysphonia or aphasia.  Mood and affect are appropriate.   Cranial nerves: no loss of smell or taste reported  Pupils are equal and briskly reactive to light. Funduscopic exam deferred.  Extraocular movements in vertical and horizontal planes were intact and without nystagmus. No Diplopia. Visual fields by finger perimetry are intact. Hearing was intact to soft voice and finger rubbing. Facial sensation intact to fine touch.  Facial motor strength is symmetric and tongue and uvula move midline.  Neck ROM : rotation, tilt and flexion extension were normal for age and shoulder shrug was symmetrical.    Motor exam:  Symmetric bulk, tone and ROM.   Normal tone without cog- wheeling, asymmetric grip strength. The left grip was stronger !!!   Sensory:  Fine touch and vibration were felt stronger on the right than left .  Proprioception tested in the upper extremities was normal.   Coordination: Rapid alternating movements in the fingers/hands were of abnormally slow speed.  The Finger-to-nose maneuver was intact without evidence of ataxia, dysmetria or tremor.    ASSESSMENT AND PLAN 57 y.o. year old female  here with:    1)  Poor sleep habits, disturbed circadian rhythm- and all this since the stroke. She reports frequent nocturia and requent sleep interruptions from pain.   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    3) Medication effect on sleepiness: she is now on Depakote - and she feels sleepier than before the stroke-  The stroke affected the right posterior MCA territory and would be considered a "sleepy stroke". There is little left sided weakness left.    I will order an HST- and we may follow up with in lab study if not diagnostic.     I plan to follow up either personally  or through our NP  if the sleep test is positive for apnea. Follow within 5 months. I will add a sleep hygiene instruction to our visit, we had a long talk about her TV habits and how to avoid daytime napping. If apnea is present, we may be able to reduce nocturia with the treatment.  I would like to thank Dr Terrace Arabia for allowing me to meet with this pleasant patient.   I discussed with Mrs. Ekstein that she will likely not ever return to work and is to be taking antiseizure medication for many more years, likely all the rest of her life.   After spending a total time of  50  minutes face to face and additional time for physical and neurologic examination, review of laboratory studies,  personal review of imaging studies, reports and results of other testing and review of referral information / records as far as provided in visit,   Electronically signed by: Melvyn Novas, MD 06/21/2023 9:33 AM  [facility] Board certified by The [employer] of Sleep Medicine and Diplomate of the [employer] of Sleep Medicine. Board certified In Neurology through the ABPN, Fellow  of the [employer] of Neurology.

## 2023-06-22 ENCOUNTER — Telehealth: Payer: Self-pay | Admitting: Neurology

## 2023-06-22 NOTE — Telephone Encounter (Signed)
HST- [facility] blue pending uploaded notes.

## 2023-06-27 ENCOUNTER — Other Ambulatory Visit: Payer: Self-pay

## 2023-06-27 NOTE — Telephone Encounter (Signed)
Checked status on the portal it is still pending.

## 2023-06-27 NOTE — Telephone Encounter (Signed)
HST- [facility] no auth req via fax form

## 2023-07-08 ENCOUNTER — Other Ambulatory Visit: Payer: Self-pay | Admitting: Primary Care

## 2023-07-08 DIAGNOSIS — Z1211 Encounter for screening for malignant neoplasm of colon: Secondary | ICD-10-CM

## 2023-07-08 DIAGNOSIS — Z1212 Encounter for screening for malignant neoplasm of rectum: Secondary | ICD-10-CM

## 2023-08-22 ENCOUNTER — Encounter (INDEPENDENT_AMBULATORY_CARE_PROVIDER_SITE_OTHER): Payer: Self-pay | Admitting: Primary Care

## 2023-08-22 ENCOUNTER — Ambulatory Visit (INDEPENDENT_AMBULATORY_CARE_PROVIDER_SITE_OTHER): Payer: Medicaid Other | Admitting: Primary Care

## 2023-08-22 VITALS — BP 128/82 | HR 65 | Resp 16 | Wt 151.8 lb

## 2023-08-22 DIAGNOSIS — Z1159 Encounter for screening for other viral diseases: Secondary | ICD-10-CM | POA: Diagnosis not present

## 2023-08-22 DIAGNOSIS — I10 Essential (primary) hypertension: Secondary | ICD-10-CM | POA: Diagnosis not present

## 2023-08-22 DIAGNOSIS — Z2821 Immunization not carried out because of patient refusal: Secondary | ICD-10-CM

## 2023-08-22 DIAGNOSIS — Z1211 Encounter for screening for malignant neoplasm of colon: Secondary | ICD-10-CM

## 2023-08-22 DIAGNOSIS — Z23 Encounter for immunization: Secondary | ICD-10-CM | POA: Diagnosis not present

## 2023-08-22 NOTE — Patient Instructions (Signed)
Zoster Vaccine Injection What is this medication? ZOSTER VACCINE (ZOS ter vak SEEN) reduces the risk of herpes zoster (shingles). It does not treat shingles. It is still possible to get shingles after receiving the vaccine, but the symptoms may be less severe or not last as long. It works by helping your immune system learn how to fight off a future infection. This medicine may be used for other purposes; ask your health care provider or pharmacist if you have questions. COMMON BRAND NAME(S): [facility] What should I tell my care team before I take this medication? They need to know if you have any of these conditions: Cancer Immune system problems An unusual or allergic reaction to Zoster vaccine, other medications, foods, dyes, or preservatives Pregnant or trying to get pregnant Breastfeeding How should I use this medication? This vaccine is injected into a muscle. It is given by your care team. This vaccine requires 2 doses to get the full benefit. Set a reminder for when your next dose is due. A copy of Vaccine Information Statements will be given before each vaccination. Be sure to read this information carefully each time. This sheet may change often. Talk to your care team about the use of this vaccine in children. This vaccine is not approved for use in children. Overdosage: If you think you have taken too much of this medicine contact a poison control center or emergency room at once. NOTE: This medicine is only for you. Do not share this medicine with others. What if I miss a dose? Keep appointments for follow-up (booster) doses. It is important not to miss your dose. Call your care team if you are unable to keep an appointment. What may interact with this medication? Medications that suppress your immune system Medications to treat cancer Steroid medications, such as prednisone or cortisone This list may not describe all possible interactions. Give your health care provider a list  of all the medicines, herbs, non-prescription drugs, or dietary supplements you use. Also tell them if you smoke, drink alcohol, or use illegal drugs. Some items may interact with your medicine. What should I watch for while using this medication? Visit your care team regularly. This vaccine, like all vaccines, may not fully protect everyone. What side effects may I notice from receiving this medication? Side effects that you should report to your care team as soon as possible: Allergic reactions--skin rash, itching, hives, swelling of the face, lips, tongue, or throat Side effects that usually do not require medical attention (report these to your care team if they continue or are bothersome): Chills Fatigue Feeling faint or lightheaded Fever Headache Muscle pain Pain, redness, or irritation at injection site This list may not describe all possible side effects. Call your doctor for medical advice about side effects. You may report side effects to FDA at 1-800-FDA-1088. Where should I keep my medication? This vaccine is only given by your care team. It will not be stored at home. NOTE: This sheet is a summary. It may not cover all possible information. If you have questions about this medicine, talk to your doctor, pharmacist, or health care provider.  2024 Elsevier/Gold Standard (2022-03-11 00:00:00)

## 2023-08-22 NOTE — Progress Notes (Signed)
[facility]  Brandy Nguyen, is a 57 y.o. female  NWG:[id number]  [id number]:[id number]  DOB - 12/14/1965       Subjective:   Brandy Nguyen is a 57 y.o. female here today for a follow up visit. Patient has No headache, No chest pain, No abdominal pain - No Nausea, No new weakness tingling or numbness, No Cough - shortness of breath  No problems updated.  No Known Allergies  Past Medical History:  Diagnosis Date   Anxiety    Asthma    Hyperlipidemia 03/11/2021   Hypertension     Current Outpatient Medications on File Prior to Visit  Medication Sig Dispense Refill   ALPRAZolam (XANAX) 0.25 MG tablet Take 1 tablet (0.25 mg total) by mouth at bedtime as needed for sleep (for sleep study). 2 tablet 0   amLODipine (NORVASC) 10 MG tablet Take 1 tablet (10 mg total) by mouth daily. 90 tablet 3   aspirin 81 MG chewable tablet Chew 1 tablet (81 mg total) by mouth daily.     atorvastatin (LIPITOR) 40 MG tablet Take 1 tablet (40 mg total) by mouth once daily. 90 tablet 3   divalproex (DEPAKOTE ER) 500 MG 24 hr tablet Take 2 tablets (1,000 mg total) by mouth at bedtime. 180 tablet 3   docusate sodium (COLACE) 100 MG capsule Take 1 capsule (100 mg total) by mouth 2 (two) times daily. 100 capsule 11   No current facility-administered medications on file prior to visit.    Objective:   Vitals:   08/22/23 0940  BP: 128/82  Pulse: 65  Resp: 16  SpO2: 99%  Weight: 151 lb 12.8 oz (68.9 kg)    Comprehensive ROS Pertinent positive and negative noted in HPI   Exam General appearance : Awake, alert, not in any distress. Speech Clear. Not toxic looking HEENT: Atraumatic and Normocephalic, pupils equally reactive to light and accomodation Neck: Supple, no JVD. No cervical lymphadenopathy.  Chest: Good air entry bilaterally, no added sounds  CVS: S1 S2 regular, no murmurs.  Abdomen: Bowel sounds present, Non tender and not distended with no gaurding, rigidity or  rebound. Extremities: B/L Lower Ext shows no edema, both legs are warm to touch Neurology: Awake alert, and oriented X 3, CN II-XII intact, Non focal Skin: No Rash  Data Review Lab Results  Component Value Date   HGBA1C 5.8 (H) 09/13/2022   HGBA1C 5.3 03/11/2021   CrCl cannot be calculated (Patient's most recent lab result is older than the maximum 21 days allowed.).  Clinical ASCVD: No  The ASCVD Risk score (Arnett DK, et al., 2019) failed to calculate for the following reasons:   The patient has a prior MI or stroke diagnosis  ASCVD risk factors include- [place]  Health Maintenance  Topic Date Due   Hepatitis C Screening  Never done   Colonoscopy  Never done   COVID-19 Vaccine (2 - 2023-24 season) 06/05/2023   INFLUENZA VACCINE  01/02/2024 (Originally 05/05/2023)   MAMMOGRAM  06/01/2025   Cervical Cancer Screening (HPV/Pap Cotest)  02/17/2028   DTaP/Tdap/Td (2 - Td or Tdap) 02/16/2033   HIV Screening  Completed   Zoster Vaccines- Shingrix  Completed   HPV VACCINES  Aged Out    ASSESSMENT & PLAN: Diagnoses and all orders for this visit:  Influenza vaccination declined  Encounter for immunization -     Varicella-zoster vaccine IM  Screening for colon cancer Referral made for GI: Colonoscopy  Encounter for HCV screening test for low risk  patient    Essential hypertension Controlled -Counseled on lifestyle modifications for blood pressure control including reduced dietary sodium, increased exercise, weight reduction and adequate sleep. Also, educated patient about the risk for cardiovascular events, stroke and heart attack. Also counseled patient about the importance of medication adherence. If you participate in smoking, it is important to stop using tobacco as this will increase the risks associated with uncontrolled blood pressure.  Goal BP:  For patients younger than 60: Goal BP < 130/80. For patients 60 and older: Goal BP < 140/90. For patients with diabetes: Goal  BP < 130/80. Your most recent BP: 128/82  Minimize salt intake. Minimize alcohol intake   This note has been created with Education officer, environmental. Any transcriptional errors are unintentional.   Grayce Sessions, NP 08/22/2023, 9:58 AM

## 2023-08-24 ENCOUNTER — Telehealth: Payer: Self-pay | Admitting: *Deleted

## 2023-08-24 NOTE — Telephone Encounter (Signed)
Copy made, original copy left in medical records for Debra to pick up.

## 2023-08-24 NOTE — Telephone Encounter (Signed)
Pt medical clearance faxed on 08/24/2023

## 2023-10-31 ENCOUNTER — Encounter: Payer: Self-pay | Admitting: Primary Care

## 2023-11-30 ENCOUNTER — Other Ambulatory Visit: Payer: Self-pay

## 2023-12-01 ENCOUNTER — Telehealth (INDEPENDENT_AMBULATORY_CARE_PROVIDER_SITE_OTHER): Payer: Self-pay | Admitting: Primary Care

## 2023-12-01 NOTE — Telephone Encounter (Signed)
 Called to confirm atp with pt. Pt did not answer and could not leave VM

## 2023-12-02 ENCOUNTER — Ambulatory Visit (INDEPENDENT_AMBULATORY_CARE_PROVIDER_SITE_OTHER): Payer: Medicaid Other | Admitting: Primary Care

## 2023-12-02 ENCOUNTER — Encounter (INDEPENDENT_AMBULATORY_CARE_PROVIDER_SITE_OTHER): Payer: Self-pay | Admitting: Primary Care

## 2023-12-02 VITALS — BP 126/77 | HR 64 | Wt 145.4 lb

## 2023-12-02 DIAGNOSIS — Z1211 Encounter for screening for malignant neoplasm of colon: Secondary | ICD-10-CM

## 2023-12-02 DIAGNOSIS — Z1159 Encounter for screening for other viral diseases: Secondary | ICD-10-CM | POA: Diagnosis not present

## 2023-12-02 DIAGNOSIS — I10 Essential (primary) hypertension: Secondary | ICD-10-CM

## 2023-12-02 MED ORDER — AMLODIPINE BESYLATE 10 MG PO TABS: 10.0000 mg | ORAL_TABLET | Freq: Every day | ORAL | 1 refills | Status: DC

## 2023-12-02 NOTE — Progress Notes (Signed)
 [facility]  Brandy Nguyen, is a 58 y.o. female  HYQ:[id number]  [id number]  DOB - 1966/05/19  Chief Complaint  Patient presents with   Medication Refill       Subjective:   Brandy Nguyen is a 58 y.o. female here today for a follow up visit HTN. Patient has No headache, No chest pain, No abdominal pain - No Nausea, No new weakness tingling or numbness, No Cough - shortness of breath.  Requesting medication refills  No problems updated.  Comprehensive ROS Pertinent positive and negative noted in HPI   No Known Allergies  Past Medical History:  Diagnosis Date   Anxiety    Asthma    Hyperlipidemia 03/11/2021   Hypertension     Current Outpatient Medications on File Prior to Visit  Medication Sig Dispense Refill   ALPRAZolam (XANAX) 0.25 MG tablet Take 1 tablet (0.25 mg total) by mouth at bedtime as needed for sleep (for sleep study). 2 tablet 0   aspirin 81 MG chewable tablet Chew 1 tablet (81 mg total) by mouth daily.     atorvastatin (LIPITOR) 40 MG tablet Take 1 tablet (40 mg total) by mouth once daily. 90 tablet 3   divalproex (DEPAKOTE ER) 500 MG 24 hr tablet Take 2 tablets (1,000 mg total) by mouth at bedtime. 180 tablet 3   docusate sodium (COLACE) 100 MG capsule Take 1 capsule (100 mg total) by mouth 2 (two) times daily. 100 capsule 11   No current facility-administered medications on file prior to visit.   Health Maintenance  Topic Date Due   Pneumococcal Vaccination (1 of 2 - PCV) Never done   Hepatitis C Screening  Never done   Colon Cancer Screening  Never done   COVID-19 Vaccine (2 - 2024-25 season) 06/05/2023   Flu Shot  01/02/2024*   Mammogram  06/01/2025   Pap with HPV screening  02/17/2028   DTaP/Tdap/Td vaccine (2 - Td or Tdap) 02/16/2033   HIV Screening  Completed   Zoster (Shingles) Vaccine  Completed   HPV Vaccine  Aged Out  *Topic was postponed. The date shown is not the original due date.    Objective:   Vitals:    12/02/23 0916  BP: 126/77  Pulse: 64  SpO2: 100%  Weight: 145 lb 6.4 oz (66 kg)      Assessment & Plan  Brandy Nguyen was seen today for medication refill.  Diagnoses and all orders for this visit:  Essential hypertension BP goal - < 140/90  Explained that having normal blood pressure is the goal and medications are helping to get to goal and maintain normal blood pressure. DIET: Limit salt intake, read nutrition labels to check salt content, limit fried and high fatty foods  Avoid using multisymptom OTC cold preparations that generally contain sudafed which can rise BP. Consult with pharmacist on best cold relief products to use for persons with HTN EXERCISE Discussed incorporating exercise such as walking - 30 minutes most days of the week and can do in 10 minute intervals    -     amLODipine (NORVASC) 10 MG tablet; Take 1 tablet (10 mg total) by mouth daily.     Patient have been counseled extensively about nutrition and exercise. Other issues discussed during this visit include: low cholesterol diet, weight control and daily exercise, foot care, annual eye examinations at Ophthalmology, importance of adherence with medications and regular follow-up. We also discussed long term complications of uncontrolled diabetes and hypertension.   Return  in about 3 months (around 02/29/2024), or Hypertension.  The patient was given clear instructions to go to ER or return to medical center if symptoms don't improve, worsen or new problems develop. The patient verbalized understanding. The patient was told to call to get lab results if they haven't heard anything in the next week.   This note has been created with Education officer, environmental. Any transcriptional errors are unintentional.   Grayce Sessions, NP 12/04/2023, 9:28 PM

## 2023-12-04 ENCOUNTER — Encounter (INDEPENDENT_AMBULATORY_CARE_PROVIDER_SITE_OTHER): Payer: Self-pay | Admitting: Primary Care

## 2023-12-05 ENCOUNTER — Telehealth (INDEPENDENT_AMBULATORY_CARE_PROVIDER_SITE_OTHER): Payer: Self-pay | Admitting: Primary Care

## 2023-12-05 NOTE — Telephone Encounter (Signed)
 Pt came in and was asking about a medication that was prescribed to her. Pt wanted to make sure meds were sent to pharmacy.

## 2024-04-27 ENCOUNTER — Other Ambulatory Visit: Payer: Self-pay | Admitting: Primary Care

## 2024-04-27 DIAGNOSIS — Z1231 Encounter for screening mammogram for malignant neoplasm of breast: Secondary | ICD-10-CM

## 2024-05-02 ENCOUNTER — Telehealth (INDEPENDENT_AMBULATORY_CARE_PROVIDER_SITE_OTHER): Payer: Self-pay | Admitting: Primary Care

## 2024-05-02 ENCOUNTER — Ambulatory Visit (INDEPENDENT_AMBULATORY_CARE_PROVIDER_SITE_OTHER): Admitting: Primary Care

## 2024-05-02 NOTE — Telephone Encounter (Signed)
 Called pt to see if interested in rescheduling missed appt. Pt will reschedule at this time.

## 2024-05-07 ENCOUNTER — Emergency Department (HOSPITAL_COMMUNITY)
Admission: EM | Admit: 2024-05-07 | Discharge: 2024-05-07 | Disposition: A | Discharge status: Home / Self Care | Attending: Emergency Medicine | Admitting: Emergency Medicine

## 2024-05-07 DIAGNOSIS — R569 Unspecified convulsions: Secondary | ICD-10-CM

## 2024-05-07 DIAGNOSIS — J45909 Unspecified asthma, uncomplicated: Secondary | ICD-10-CM | POA: Diagnosis not present

## 2024-05-07 DIAGNOSIS — D72829 Elevated white blood cell count, unspecified: Secondary | ICD-10-CM | POA: Diagnosis not present

## 2024-05-07 DIAGNOSIS — G40919 Epilepsy, unspecified, intractable, without status epilepticus: Secondary | ICD-10-CM

## 2024-05-07 DIAGNOSIS — I10 Essential (primary) hypertension: Secondary | ICD-10-CM | POA: Insufficient documentation

## 2024-05-07 DIAGNOSIS — G4089 Other seizures: Secondary | ICD-10-CM | POA: Insufficient documentation

## 2024-05-07 LAB — CBC
HCT: 46.5 % — ABNORMAL HIGH (ref 36.0–46.0)
Hemoglobin: 14.9 g/dL (ref 12.0–15.0)
MCH: 30 pg (ref 26.0–34.0)
MCHC: 32 g/dL (ref 30.0–36.0)
MCV: 93.8 fL (ref 80.0–100.0)
Platelets: 275 K/uL (ref 150–400)
RBC: 4.96 MIL/uL (ref 3.87–5.11)
RDW: 14.5 % (ref 11.5–15.5)
WBC: 12.8 K/uL — ABNORMAL HIGH (ref 4.0–10.5)
nRBC: 0 % (ref 0.0–0.2)

## 2024-05-07 LAB — URINALYSIS, ROUTINE W REFLEX MICROSCOPIC
Bilirubin Urine: NEGATIVE
Glucose, UA: NEGATIVE mg/dL
Hgb urine dipstick: NEGATIVE
Ketones, ur: 5 mg/dL — AB
Leukocytes,Ua: NEGATIVE
Nitrite: NEGATIVE
Protein, ur: NEGATIVE mg/dL
Specific Gravity, Urine: 1.009 (ref 1.005–1.030)
pH: 6 (ref 5.0–8.0)

## 2024-05-07 LAB — BASIC METABOLIC PANEL WITH GFR
Anion gap: 12 (ref 5–15)
BUN: 11 mg/dL (ref 6–20)
CO2: 23 mmol/L (ref 22–32)
Calcium: 9.3 mg/dL (ref 8.9–10.3)
Chloride: 102 mmol/L (ref 98–111)
Creatinine, Ser: 0.97 mg/dL (ref 0.44–1.00)
GFR, Estimated: >60 mL/min (ref >60)
Glucose, Bld: 149 mg/dL — ABNORMAL HIGH (ref 70–99)
Potassium: 4.2 mmol/L (ref 3.5–5.1)
Sodium: 137 mmol/L (ref 135–145)

## 2024-05-07 LAB — RAPID URINE DRUG SCREEN, HOSP PERFORMED
Amphetamines: NOT DETECTED
Barbiturates: NOT DETECTED
Benzodiazepines: NOT DETECTED
Cocaine: POSITIVE — AB
Opiates: NOT DETECTED
Tetrahydrocannabinol: NOT DETECTED

## 2024-05-07 LAB — CBG MONITORING, ED: Glucose-Capillary: 161 mg/dL — ABNORMAL HIGH (ref 70–99)

## 2024-05-07 LAB — VALPROIC ACID LEVEL: Valproic Acid Lvl: 40 ug/mL — ABNORMAL LOW (ref 50–100)

## 2024-05-07 LAB — ETHANOL: Alcohol, Ethyl (B): <15 mg/dL (ref <15)

## 2024-05-07 MED ORDER — DIVALPROEX SODIUM 250 MG PO DR TAB
500.0000 mg | DELAYED_RELEASE_TABLET | Freq: Once | ORAL | Status: AC
  Administered 2024-05-07: 500 mg via ORAL
  Filled 2024-05-07: qty 2

## 2024-05-07 MED ORDER — AMLODIPINE BESYLATE 5 MG PO TABS
10.0000 mg | ORAL_TABLET | Freq: Once | ORAL | Status: AC
  Administered 2024-05-07: 10 mg via ORAL
  Filled 2024-05-07: qty 2

## 2024-05-07 NOTE — ED Triage Notes (Signed)
 Patient c/o multiple seizures today, despite taking meds like usual and denies changes to diet and intake. Patient is A&Ox4 at this time, but family reports she was postictal after both episodes today.

## 2024-05-07 NOTE — ED Notes (Signed)
Pt discharged. Pt given discharge papers and papers explained. Pt in NAD at this time

## 2024-05-07 NOTE — Discharge Instructions (Addendum)
 You were seen today for seizure. While you were here we monitored your vitals, preformed a physical exam, and lab.  It seems that your Depakote  level was low.  Please be sure you are not missing any doses.  Speak with neurology about possible increase in your dosage.  Things to do:  - Follow up with your primary care provider within the next 1-2 weeks - Follow-up or call your neurologist regarding possible medication changes  Return to the emergency department if you have any new or worsening symptoms including repetitive seizures the within 15 minutes of each other, seizures the last greater than 5 minutes in duration, or if you have any other concerns.

## 2024-05-07 NOTE — ED Provider Triage Note (Signed)
 Emergency Medicine Provider Triage Evaluation Note  Brandy Nguyen , a 58 y.o. female  was evaluated in triage.  Pt complains of seizure x 2 today.  Patient is accompanied by a friend/family member who provides collateral information, states that she had a seizure in the car around 1 PM, was staring off into space and unresponsive for approximately 5 minutes.  She then had a second seizure around 6 PM, she was sitting in a chair and began to have shaking of her arms, then shaking of her legs, then clenching of her fists and was similarly unresponsive for 3 to 5 minutes.  She was postictal each time, no tongue biting, no urinary incontinence.  On Depakote  and is compliant with this, her friend/member states that has been at least a month if not more since she has had a seizure, and this is unusual for her to have 2 in 1 day.  No recent illness.  Review of Systems  Positive: As above Negative: As above  Physical Exam  BP (!) 208/98 (BP Location: Right Arm)   Pulse 86   Temp 98.8 F (37.1 C)   Resp 16   SpO2 93%  Gen:   Drowsy but easy to rouse   Resp:  Normal effort  MSK:   Moves extremities without difficulty  Other:    Medical Decision Making  Medically screening exam initiated at 7:45 PM.  Appropriate orders placed.  Brandy Nguyen was informed that the remainder of the evaluation will be completed by another provider, this initial triage assessment does not replace that evaluation, and the importance of remaining in the ED until their evaluation is complete.     Brandy Nguyen, [place] 05/07/24 [phone]

## 2024-05-07 NOTE — ED Provider Notes (Signed)
 [place] EMERGENCY DEPARTMENT AT [facility] Provider Note  MDM   HPI/ROS:  Brandy Nguyen is a 58 y.o. female with a medical history as below who presents with 2 episodes of seizures first occurring at 1:00 today second occurring approximately 6 PM.  The first was an episode of staring off and unresponsiveness that lasted approximately 5 minutes before she came around.  She had some slight confusion afterwards but quickly returned to baseline.  Second episode was more shaking in her arms as well as unresponsiveness.  She they again stated lasted a few minutes and she returned to her baseline approximately 20 minutes later.  They states she has been a bit more tired since.  They deny any recent missed medication doses and states she has been in her normal state of health as of late.  They do report she has had to pee more frequently.  Patient denies any dysuria but does endorse frequency.  Denies fevers.  Physical exam is notable for: - A bit somnolent but easily arousable.  Benign neurologic exam.  No abdominal tenderness, afebrile and hemodynamically intact.  Hypertensive  On my initial evaluation, patient is:  -Vital signs stable.  Although hypertensive patient afebrile, hemodynamically stable, and non-toxic appearing. -Additional history obtained from family  This patient's current presentation, including their history and physical exam, is most consistent with breakthrough seizures. Differentials include Seizure, hypoglycemia, intoxication, substance withdrawal, electrolyte abnormality, meningitis, encephalitis, space-occupying lesion, trauma, CVA, CNS vasculitis, uremia, febrile seizure, syncope, arrhythmia, pseudoseizure, panic attack, migraine, narcolepsy.    Physical exam is reassuring as patient does appear back to her mental baseline although a bit more tired.  Her urinary frequency is concerning enough that I believe it is reasonable to ensure she does not have an underlying  infection causing her breakthrough seizures.  Her Depakote  level however is low concerning for possible noncompliance.  I did give her a dose her Depakote  here.  Also gave her home blood pressure meds although she is not endorsing any headaches, chest pain or shortness of breath.  Do not believe hypertensive emergency or press at this time.  Given her benign exam defer CT head.  She has no obvious signs of infection and no UTI.  Given this I believe she is appropriate for discharge with likely medication noncompliance.  She was discharged in stable condition with instruction to follow-up with her PCP.  Interpretations, interventions, and the patient's course of care are documented below.    Clinical Course as of 05/07/24 2316  Mon May 07, 2024  2028 WBC(!): 12.8 Leukocytosis with left shift reactive versus infectious [RC]  2028 Temp: 98.8 F (37.1 C) [RC]  2028 BP(!): 208/98 [RC]  2046 ED EKG Rate 75, NSR, normal axis and intervals.  LVH without STE or depression.  TWI in lateral leads consistent with prior EKG [RC]  2316 BP(!): 187/99 [RC]    Clinical Course User Index [RC] Sharyne Darina RAMAN, MD      Disposition:  I discussed the plan for discharge with the patient and/or their surrogate at bedside prior to discharge and they were in agreement with the plan and verbalized understanding of the return precautions provided. All questions answered to the best of my ability. Ultimately, the patient was discharged in stable condition with stable vital signs. I am reassured that they are capable of close follow up and good social support at home.   Clinical Impression:  1. Breakthrough seizure (HCC)   2. Seizure (HCC)  Rx / DC Orders ED Discharge Orders          Ordered    Ambulatory referral to Neurology       Comments: An appointment is requested in approximately: 2 weeks   05/07/24 2307            The plan for this patient was discussed with Dr. Zavitz, who voiced agreement  and who oversaw evaluation and treatment of this patient.   Clinical Complexity A medically appropriate history, review of systems, and physical exam was performed.  My independent interpretations of EKG, labs, and radiology are documented in the ED course above.   If decision rules were used in this patient's evaluation, they are listed below.   Click here for ABCD2, HEART and other calculatorsREFRESH Note before signing   Patient's presentation is most consistent with exacerbation of chronic illness.  Medical Decision Making Amount and/or Complexity of Data Reviewed Labs:  Decision-making details documented in ED Course. ECG/medicine tests:  Decision-making details documented in ED Course.  Risk Prescription drug management.    HPI/ROS      See MDM section for pertinent HPI and ROS. A complete ROS was performed with pertinent positives/negatives noted above.   Past Medical History:  Diagnosis Date   Anxiety    Asthma    Hyperlipidemia 03/11/2021   Hypertension     Past Surgical History:  Procedure Laterality Date   TUBAL LIGATION        Physical Exam   Vitals:   05/07/24 1855 05/07/24 2030 05/07/24 2245  BP: (!) 208/98 (!) 180/97 (!) 187/99  Pulse: 86 77 88  Resp: 16 (!) 21 (!) 22  Temp: 98.8 F (37.1 C)  99.4 F (37.4 C)  TempSrc:   Oral  SpO2: 93% 98% 94%    Physical Exam Vitals and nursing note reviewed.  Constitutional:      General: She is not in acute distress.    Appearance: She is well-developed.  HENT:     Head: Normocephalic and atraumatic.  Eyes:     Conjunctiva/sclera: Conjunctivae normal.  Cardiovascular:     Rate and Rhythm: Normal rate and regular rhythm.     Heart sounds: No murmur heard. Pulmonary:     Effort: Pulmonary effort is normal. No respiratory distress.     Breath sounds: Normal breath sounds.  Abdominal:     Palpations: Abdomen is soft.     Tenderness: There is no abdominal tenderness.  Musculoskeletal:         General: No swelling.     Cervical back: Neck supple.  Skin:    General: Skin is warm and dry.     Capillary Refill: Capillary refill takes less than 2 seconds.  Neurological:     General: No focal deficit present.     Mental Status: She is alert and oriented to person, place, and time.     GCS: GCS eye subscore is 4. GCS verbal subscore is 5. GCS motor subscore is 6.     Cranial Nerves: Cranial nerves 2-12 are intact.     Sensory: Sensation is intact.     Motor: Motor function is intact.     Comments: Somnolent but easily roused    Psychiatric:        Mood and Affect: Mood normal.      Procedures   If procedures were preformed on this patient, they are listed below:  Procedures   @BBSIG @   Please note that this documentation was produced  with the assistance of voice-to-text technology and may contain errors.    Sharyne Darina RAMAN, MD 05/07/24 7681    Tonia Chew, MD 05/08/24 [phone]

## 2024-05-10 ENCOUNTER — Ambulatory Visit (INDEPENDENT_AMBULATORY_CARE_PROVIDER_SITE_OTHER): Admitting: Primary Care

## 2024-05-10 ENCOUNTER — Encounter (INDEPENDENT_AMBULATORY_CARE_PROVIDER_SITE_OTHER): Payer: Self-pay | Admitting: Primary Care

## 2024-05-10 ENCOUNTER — Other Ambulatory Visit: Payer: Self-pay

## 2024-05-10 VITALS — BP 126/86 | HR 92 | Temp 97.9°F | Ht 59.0 in | Wt 135.0 lb

## 2024-05-10 DIAGNOSIS — G40909 Epilepsy, unspecified, not intractable, without status epilepticus: Secondary | ICD-10-CM

## 2024-05-10 DIAGNOSIS — Z09 Encounter for follow-up examination after completed treatment for conditions other than malignant neoplasm: Secondary | ICD-10-CM

## 2024-05-10 DIAGNOSIS — Z1211 Encounter for screening for malignant neoplasm of colon: Secondary | ICD-10-CM

## 2024-05-10 DIAGNOSIS — I10 Essential (primary) hypertension: Secondary | ICD-10-CM | POA: Diagnosis not present

## 2024-05-10 MED ORDER — DIVALPROEX SODIUM ER 500 MG PO TB24: 500.0000 mg | ORAL_TABLET | Freq: Every day | ORAL | 0 refills | Status: DC

## 2024-05-10 NOTE — Progress Notes (Signed)
 Subjective:   Brandy Nguyen is a 58 y.o. female presents for ED follow up.  She has given her sister Brandy Nguyen permission to be at her appointment.  She presented  05/07/24 2 episodes of seizures first occurring at 1:00 today second occurring approximately 6 PM. The first was an episode of staring off and unresponsiveness that lasted approximately 5 minutes before she came around. She had some slight confusion afterwards but quickly returned to baseline. Second episode was more shaking in her arms as well as unresponsiveness.  Question patient several times what she has been doing stated not taking my medication reviewing medication list she has not been on Depakote  for a breakthrough seizure.  Reviewed labs and cocaine was in her system.  Question patient again what have you been going nothing.  They can presented her with her UDS.  Probability the underlying cause of seizure and blood pressure was elevated  172/96 at discharge initial 208/98.    No Known Allergies  Current Outpatient Medications on File Prior to Visit  Medication Sig Dispense Refill   aspirin  81 MG chewable tablet Chew 1 tablet (81 mg total) by mouth daily.     atorvastatin  (LIPITOR) 40 MG tablet Take 1 tablet (40 mg total) by mouth once daily. (Patient not taking: Reported on 05/16/2024) 90 tablet 3   ALPRAZolam  (XANAX ) 0.25 MG tablet Take 1 tablet (0.25 mg total) by mouth at bedtime as needed for sleep (for sleep study). (Patient not taking: Reported on 05/16/2024) 2 tablet 0   No current facility-administered medications on file prior to visit.    Review of System: ROS Comprehensive ROS Pertinent positive and negative noted in HPI   Objective:  BP 126/86 (BP Location: Left Arm, Patient Position: Sitting, Cuff Size: Normal)   Pulse 92   Temp 97.9 F (36.6 C) (Oral)   Ht 4' 11 (1.499 m)   Wt 135 lb (61.2 kg)   SpO2 98%   BMI 27.27 kg/m   Filed Weights   05/10/24 0950  Weight: 135 lb (61.2 kg)    Physical  Exam Vitals reviewed.  Constitutional:      Appearance: Normal appearance.  HENT:     Head: Normocephalic.     Right Ear: Tympanic membrane, ear canal and external ear normal.     Left Ear: Tympanic membrane, ear canal and external ear normal.     Nose: Nose normal.     Mouth/Throat:     Mouth: Mucous membranes are moist.  Eyes:     Extraocular Movements: Extraocular movements intact.     Pupils: Pupils are equal, round, and reactive to light.  Cardiovascular:     Rate and Rhythm: Normal rate.  Pulmonary:     Effort: Pulmonary effort is normal.     Breath sounds: Normal breath sounds.  Abdominal:     General: Bowel sounds are normal.     Palpations: Abdomen is soft.  Musculoskeletal:        General: Normal range of motion.     Cervical back: Normal range of motion.  Skin:    General: Skin is warm and dry.  Neurological:     Mental Status: She is alert and oriented to person, place, and time.  Psychiatric:        Mood and Affect: Mood normal.        Behavior: Behavior normal.        Thought Content: Thought content normal.      Assessment:  [name] was  seen today for hospitalization follow-up.  Diagnoses and all orders for this visit:  Hospital discharge follow-up  See HPI   Essential hypertension Control  BP goal - < 140/90 DIET: Limit salt intake, read nutrition labels to check salt content, limit fried and high fatty foods  Avoid using multisymptom OTC cold preparations that generally contain sudafed which can rise BP. Consult with pharmacist on best cold relief products to use for persons with HTN EXERCISE Discussed incorporating exercise such as walking - 30 minutes most days of the week and can do in 10 minute intervals     Colon cancer screening Ambulatory referral to Gastroenterology   Seizure disorder [facility]) -     Ambulatory referral to Neurology -     Discontinue: divalproex  (DEPAKOTE  ER) 500 MG 24 hr tablet; Take 1 tablet (500 mg total) by mouth at  bedtime.    This note has been created with Education officer, environmental. Any transcriptional errors are unintentional.   Return in about 3 months (around 08/10/2024).  [name], NP 05/19/2024, 2:22 PM

## 2024-05-10 NOTE — Patient Instructions (Signed)
 Type Date User Summary Attachment  Provider Comments 12/02/2023  2:10 PM Venetia Altamese FORBES Darlyn  Referral -  Note: Placed in [facility] [address] [place], [place] [address] PH# [phone]

## 2024-05-15 ENCOUNTER — Ambulatory Visit (INDEPENDENT_AMBULATORY_CARE_PROVIDER_SITE_OTHER): Admitting: Primary Care

## 2024-05-16 ENCOUNTER — Ambulatory Visit (INDEPENDENT_AMBULATORY_CARE_PROVIDER_SITE_OTHER): Admitting: Family Medicine

## 2024-05-16 ENCOUNTER — Encounter (INDEPENDENT_AMBULATORY_CARE_PROVIDER_SITE_OTHER): Payer: Self-pay | Admitting: Primary Care

## 2024-05-16 ENCOUNTER — Encounter: Payer: Self-pay | Admitting: Family Medicine

## 2024-05-16 ENCOUNTER — Other Ambulatory Visit (INDEPENDENT_AMBULATORY_CARE_PROVIDER_SITE_OTHER): Payer: Self-pay | Admitting: Primary Care

## 2024-05-16 ENCOUNTER — Other Ambulatory Visit: Payer: Self-pay

## 2024-05-16 ENCOUNTER — Ambulatory Visit (INDEPENDENT_AMBULATORY_CARE_PROVIDER_SITE_OTHER): Admitting: Primary Care

## 2024-05-16 ENCOUNTER — Telehealth: Payer: Self-pay

## 2024-05-16 VITALS — BP 103/67 | HR 68 | Resp 18 | Ht 59.0 in

## 2024-05-16 VITALS — BP 129/75 | HR 62 | Ht 59.0 in | Wt 143.8 lb

## 2024-05-16 DIAGNOSIS — I63511 Cerebral infarction due to unspecified occlusion or stenosis of right middle cerebral artery: Secondary | ICD-10-CM

## 2024-05-16 DIAGNOSIS — K5900 Constipation, unspecified: Secondary | ICD-10-CM

## 2024-05-16 DIAGNOSIS — R0683 Snoring: Secondary | ICD-10-CM | POA: Diagnosis not present

## 2024-05-16 DIAGNOSIS — E782 Mixed hyperlipidemia: Secondary | ICD-10-CM | POA: Diagnosis not present

## 2024-05-16 DIAGNOSIS — I10 Essential (primary) hypertension: Secondary | ICD-10-CM

## 2024-05-16 DIAGNOSIS — R519 Headache, unspecified: Secondary | ICD-10-CM | POA: Diagnosis not present

## 2024-05-16 DIAGNOSIS — F1410 Cocaine abuse, uncomplicated: Secondary | ICD-10-CM | POA: Diagnosis not present

## 2024-05-16 DIAGNOSIS — G40909 Epilepsy, unspecified, not intractable, without status epilepticus: Secondary | ICD-10-CM | POA: Diagnosis not present

## 2024-05-16 DIAGNOSIS — R0681 Apnea, not elsewhere classified: Secondary | ICD-10-CM | POA: Diagnosis not present

## 2024-05-16 DIAGNOSIS — F1910 Other psychoactive substance abuse, uncomplicated: Secondary | ICD-10-CM

## 2024-05-16 MED ORDER — DIVALPROEX SODIUM ER 500 MG PO TB24
500.0000 mg | ORAL_TABLET | Freq: Every day | ORAL | 3 refills | Status: AC
Start: 2024-05-16 — End: ?

## 2024-05-16 MED ORDER — AMLODIPINE BESYLATE 10 MG PO TABS
10.0000 mg | ORAL_TABLET | Freq: Every day | ORAL | 1 refills | Status: DC
  Filled 2024-05-16 (×2): qty 90, 90d supply, fill #0

## 2024-05-16 MED ORDER — AMLODIPINE BESYLATE 10 MG PO TABS
10.0000 mg | ORAL_TABLET | Freq: Every day | ORAL | 1 refills | Status: AC
Start: 2024-05-16 — End: ?

## 2024-05-16 MED ORDER — DOCUSATE SODIUM 100 MG PO CAPS
100.0000 mg | ORAL_CAPSULE | Freq: Two times a day (BID) | ORAL | 11 refills | Status: AC
Start: 2024-05-16 — End: ?

## 2024-05-16 NOTE — Progress Notes (Signed)
 [facility]  Brandy Nguyen, is a 58 y.o. female  [id number]  [id number]  DOB - 1966-01-26  Chief Complaint  Patient presents with   Hypertension       Subjective:   Brandy Nguyen is a 58 y.o. female here today for an acute visit.Followed up from last visit on use of illicit drug use. She is with her sister that has been going to meeting weekly about substance abuse but needing more consistency due to the fact she will (sister) will returning to work this month. Connected with clinical nurse manger and discussed situation and problems she will provide options and will place on AVS for review  HPI  No problems updated.  Comprehensive ROS Pertinent positive and negative noted in HPI   No Known Allergies  Past Medical History:  Diagnosis Date   Anxiety    Asthma    Hyperlipidemia 03/11/2021   Hypertension     Current Outpatient Medications on File Prior to Visit  Medication Sig Dispense Refill   ALPRAZolam  (XANAX ) 0.25 MG tablet Take 1 tablet (0.25 mg total) by mouth at bedtime as needed for sleep (for sleep study). (Patient not taking: Reported on 05/16/2024) 2 tablet 0   amLODipine  (NORVASC ) 10 MG tablet Take 1 tablet (10 mg total) by mouth daily. 90 tablet 1   aspirin  81 MG chewable tablet Chew 1 tablet (81 mg total) by mouth daily.     atorvastatin  (LIPITOR) 40 MG tablet Take 1 tablet (40 mg total) by mouth once daily. (Patient not taking: Reported on 05/16/2024) 90 tablet 3   divalproex  (DEPAKOTE  ER) 500 MG 24 hr tablet Take 1 tablet (500 mg total) by mouth at bedtime. 90 tablet 3   docusate sodium  (COLACE) 100 MG capsule Take 1 capsule (100 mg total) by mouth 2 (two) times daily. 100 capsule 11   No current facility-administered medications on file prior to visit.   Health Maintenance  Topic Date Due   Hepatitis C Screening  Never done   Pneumococcal Vaccine for high risk medical condition (1 of 2 - PCV) Never done   Colon Cancer Screening   Never done   COVID-19 Vaccine (2 - 2024-25 season) 06/05/2023   Flu Shot  01/01/2025*   Pneumococcal Vaccine for age over 33 (1 of 2 - PCV) 05/10/2025*   Mammogram  06/01/2025   Pap with HPV screening  02/17/2028   DTaP/Tdap/Td vaccine (2 - Td or Tdap) 02/16/2033   HIV Screening  Completed   Zoster (Shingles) Vaccine  Completed   Hepatitis B Vaccine  Aged Out   HPV Vaccine  Aged Out   Meningitis B Vaccine  Aged Out  *Topic was postponed. The date shown is not the original due date.    Objective:   Vitals:   05/16/24 1445  BP: 103/67  Pulse: 68  Resp: 18  SpO2: 100%  Height: 4' 11 (1.499 m)    Physical Exam Vitals reviewed.  HENT:     Right Ear: External ear normal.     Left Ear: External ear normal.  Cardiovascular:     Rate and Rhythm: Normal rate and regular rhythm.  Pulmonary:     Effort: Pulmonary effort is normal.     Breath sounds: Normal breath sounds.  Abdominal:     General: Bowel sounds are normal. There is distension.     Palpations: Abdomen is soft.  Musculoskeletal:        General: Normal range of motion.     Cervical  back: Normal range of motion.  Skin:    General: Skin is warm and dry.  Neurological:     Mental Status: She is alert. Mental status is at baseline.  Psychiatric:        Mood and Affect: Mood normal.        Behavior: Behavior normal.     Assessment & Plan  Brandy Nguyen was seen today for hypertension.  Diagnoses and all orders for this visit:  Cocaine abuse (HCC) See HPI  Mixed hyperlipidemia -     Lipid panel     Patient have been counseled extensively about nutrition and exercise. Other issues discussed during this visit include: low cholesterol diet, weight control and daily exercise, foot care, annual eye examinations at Ophthalmology, importance of adherence with medications and regular follow-up. We also discussed long term complications of uncontrolled diabetes and hypertension.   Return in about 3 months (around  08/16/2024).  The patient was given clear instructions to go to ER or return to medical center if symptoms don't improve, worsen or new problems develop. The patient verbalized understanding. The patient was told to call to get lab results if they haven't heard anything in the next week.   This note has been created with Education officer, environmental. Any transcriptional errors are unintentional.   Brandy SHAUNNA Bohr, NP 05/16/2024, 2:57 PM

## 2024-05-16 NOTE — Patient Instructions (Signed)
 [facility]- [place]: [phone] or [phone]  x [phone]- this is for the clinical manager.      [facility], [place]: [phone].     [facility] ([facility]), [place]: [phone]... [facility] , [place]: [phone].  [facility] is private insurance only

## 2024-05-16 NOTE — Progress Notes (Signed)
 Chief Complaint  Patient presents with   RM1/SEIZURES    Pt is here with her Sister. Pt states she has been having headaches. Pt states she has trouble with speech since her seizure. Pt states she has trouble with sleep since her seizure. Pt states that she has trouble focusing on things since her seizure. Pt states that her eyes have been red since her seizure.     HISTORY OF PRESENT ILLNESS:  05/16/2024 ALL: Brandy Nguyen returns for follow up for seizures. She was last seen by me 03/2023. She reported compliance with ASM and denied seizure activity. Referral placed to sleep med for concerns of sleep apnea. She was seen by Dr Chalice 06/2023. HST ordered but has not been completed. She reported non compliance with ASM regimen at that time. She was seen in the ER 05/2024 for breakthrough seizures. Valproic acid  level was 40.   Since, she reports being more consistent with ASM regimen. She tells me that she has not taken 1000mg  in over a year. She reports stinging of her brain with this dose. She usually takes divalproex  500mg  daily when she is actively compliant with medication regimen. She denies seizure activity on this dose when taking consistently. PCP refilled divalproex  ER 500mg  daily. She feels 500mg  is better tolerated. She is actively battling cocaine addiction. She reports being sober at this time. Brandy Nguyen, sister, presents with her and aids in history. She is helping her get to meetings several times a week. She lives alone in an apartment. Brandy Nguyen reports that it is not in the best part of town and surrounded by drugs. Brandy Nguyen's daughter and brother are helping her care for her sister. They are encouraging her to seek medical assisted rehab.   She is not taking amlodipine  or atorvastatin . She reports pharmacy advised she did not have active prescription. She was seen by PCP last week and reports being told to continue these.   06/21/2023 CD:  Brandy Nguyen is a 58 y.o. female patient who  is seen upon referral on 06/21/2023 from Dr Onita for a sleep evaluation. .  Chief concern according to patient :  I am very sleepy, fatigue is high, I wake up for the bathroom 4-5  times, and I am in pain- neck and left arm and shoulder. Sometimes shooting pain into legs and needle stings in my feet.    I have the pleasure of seeing Brandy Nguyen 06/21/23 a right-handed AA female with a possible sleep disorder.    The patient had seen Brandy Forbes, NP in June : She suffered stroke in June 2022, presenting with left-sided weakness, seizure-like activity, personally reviewed MRI of the brain with without contrast on March 10, 2021, large acute or early subacute right posterior MCA infarction cortical edema, extensive petechiae hemorrhage, gyriform enhancement likely related to blood-brain barrier breakdown, UDS was positive for cocaine Echocardiogram hyperdynamic no wall regional motion abnormality CT angiogram head and neck no large vessel disease   Hospital admission in March 2023 for partial seizure, left-sided body jerking movement, Todd's paralysis, EEG was unremarkable, Laboratory evaluation showed UDS positive for cocaine, benzodiazepines She was started on Keppra  during hospital stay, experienced psychosis with aggressive behavior, but psychosis resolved by day 3 of hospitalization, was seen by neurohospitalist, was switched to Depakote  DR 500 mg twice a day, Personally reviewed MRI of the brain, no acute abnormality, right posterior MCA infarction, encephalomalacia   She continues frustrated by her left-sided body symptoms, weakness, sensory loss, not  compliant with her Depakote  DR  03/29/2023 ALL:  Brandy Nguyen is a 58 y.o. female here today for follow up for  history of CVA and seizures on divalproex . She was seen in consult with Dr Onita 09/2022. EEG unremarkable. She was missing ASM with BID dosing and was switched to 1000mg  at bedtime.   Since, she reports doing fairly well. No seizure  events since last visit. She is tolerating meds. She is unable to tell me what dose but states taking 2 pills every night.   She does tell me that she is having headaches. Unsure when this started. She described an occasional burning but constant throbbing sensation across the frontal portion of her head. Most are present in the morning. Ibuprofen or Advil usually help but most of the time headache returns. She does snore. She wakes at night feeling like she isn't breathing. She does not drink much water. She tries to drink 1 Gatorade bottle daily. She likes decaf tea and coffee.    BP is usually well managed. She continues atorvastatin  40mg  and asa 81mg  daily.   She reports brain fog since stroke. She lives alone. She has a sister who checks on her regularly. She does not drive. She is able to manage finances and medications. Her mother had dementia.     HISTORY (copied from Dr Georgianne previous note)  Brandy Nguyen is a 58 year old female, seen in request by her primary care physician nurse practitioner Brandy Nguyen, for evaluation of numbness of the left hand, initial evaluation September 13, 2022   I reviewed and summarized the referring note. PMHx. HTN HLD Smoke 1ppd Seizure   She suffered stroke in June 2022, presenting with left-sided weakness, seizure-like activity, personally reviewed MRI of the brain with without contrast on March 10, 2021, large acute or early subacute right posterior MCA infarction cortical edema, extensive petechiae hemorrhage, gyriform enhancement likely related to blood-brain barrier breakdown, UDS was positive for cocaine Echocardiogram hyperdynamic no wall regional motion abnormality CT angiogram head and neck no large vessel disease   Hospital admission in March 2023 for partial seizure, left-sided body jerking movement, Todd's paralysis, EEG was unremarkable, Laboratory evaluation showed UDS positive for cocaine, benzodiazepines She was started on  Keppra  during hospital stay, experienced psychosis with aggressive behavior, but psychosis resolved by day 3 of hospitalization, was seen by neurohospitalist, was switched to Depakote  DR 500 mg twice a day, Personally reviewed MRI of the brain, no acute abnormality, right posterior MCA infarction, encephalomalacia   She continues frustrated by her left-sided body symptoms, weakness, sensory loss, not compliant with her Depakote  DR   REVIEW OF SYSTEMS: Out of a complete 14 system review of symptoms, the patient complains only of the following symptoms, brain fog, headaches, snoring, fatigue and all other reviewed systems are negative.   ALLERGIES: No Known Allergies   HOME MEDICATIONS: Outpatient Medications Prior to Visit  Medication Sig Dispense Refill   amLODipine  (NORVASC ) 10 MG tablet Take 1 tablet (10 mg total) by mouth daily. 90 tablet 1   aspirin  81 MG chewable tablet Chew 1 tablet (81 mg total) by mouth daily.     divalproex  (DEPAKOTE  ER) 500 MG 24 hr tablet Take 1 tablet (500 mg total) by mouth at bedtime. 60 tablet 0   ALPRAZolam  (XANAX ) 0.25 MG tablet Take 1 tablet (0.25 mg total) by mouth at bedtime as needed for sleep (for sleep study). (Patient not taking: Reported on 05/16/2024) 2 tablet 0   atorvastatin  (LIPITOR)  40 MG tablet Take 1 tablet (40 mg total) by mouth once daily. (Patient not taking: Reported on 05/16/2024) 90 tablet 3   docusate sodium  (COLACE) 100 MG capsule Take 1 capsule (100 mg total) by mouth 2 (two) times daily. (Patient not taking: Reported on 05/16/2024) 100 capsule 11   No facility-administered medications prior to visit.     PAST MEDICAL HISTORY: Past Medical History:  Diagnosis Date   Anxiety    Asthma    Hyperlipidemia 03/11/2021   Hypertension      PAST SURGICAL HISTORY: Past Surgical History:  Procedure Laterality Date   TUBAL LIGATION       FAMILY HISTORY: Family History  Problem Relation Age of Onset   Hypertension Mother     Dementia Mother    Colon cancer Neg Hx    Colon polyps Neg Hx    Esophageal cancer Neg Hx    Rectal cancer Neg Hx    Stomach cancer Neg Hx      SOCIAL HISTORY: Social History   Socioeconomic History   Marital status: Married    Spouse name: Not on file   Number of children: Not on file   Years of education: Not on file   Highest education level: Not on file  Occupational History   Not on file  Tobacco Use   Smoking status: Some Days    Current packs/day: 0.25    Types: Cigarettes    Passive exposure: Current   Smokeless tobacco: Current  Substance and Sexual Activity   Alcohol use: Yes   Drug use: Yes    Types: Cocaine   Sexual activity: Not on file  Other Topics Concern   Not on file  Social History Narrative   Right handed    Caffeine rare    Social Drivers of Health   Financial Resource Strain: Low Risk  (05/10/2024)   Overall Financial Resource Strain (CARDIA)    Difficulty of Paying Living Expenses: Not hard at all  Food Insecurity: No Food Insecurity (05/10/2024)   Hunger Vital Sign    Worried About Running Out of Food in the Last Year: Never true    Ran Out of Food in the Last Year: Never true  Transportation Needs: No Transportation Needs (05/10/2024)   PRAPARE - Administrator, Civil Service (Medical): No    Lack of Transportation (Non-Medical): No  Physical Activity: Insufficiently Active (05/10/2024)   Exercise Vital Sign    Days of Exercise per Week: 7 days    Minutes of Exercise per Session: 20 min  Stress: Stress Concern Present (05/10/2024)   [employer] of Occupational Health - Occupational Stress Questionnaire    Feeling of Stress: To some extent  Social Connections: Moderately Integrated (05/10/2024)   Social Connection and Isolation Panel    Frequency of Communication with Friends and Family: Once a week    Frequency of Social Gatherings with Friends and Family: Twice a week    Attends Religious Services: 1 to 4 times per year     Active Member of [employer] or Organizations: No    Attends Banker Meetings: 1 to 4 times per year    Marital Status: Separated  Intimate Partner Violence: Not on file     PHYSICAL EXAM  Vitals:   05/16/24 1024  BP: 129/75  Pulse: 62  SpO2: 99%  Weight: 143 lb 12.8 oz (65.2 kg)  Height: 4' 11 (1.499 m)    Body mass index is 29.04 kg/m.  Generalized: Well developed, in no acute distress  Cardiology: normal rate and rhythm, no murmur auscultated  Respiratory: clear to auscultation bilaterally    Neurological examination  Mentation: Alert oriented to time, place, history taking. Follows all commands speech and language fluent Cranial nerve II-XII: Pupils were equal round reactive to light. Extraocular movements were full, visual field were full on confrontational test. Facial sensation and strength were normal.  Head turning and shoulder shrug  were normal and symmetric. Motor: The motor testing reveals 5 over 5 strength of all 4 extremities. Good symmetric motor tone is noted throughout.  Sensory: Sensory testing is intact to soft touch on all 4 extremities. No evidence of extinction is noted.  Coordination: Cerebellar testing reveals good finger-nose-finger and heel-to-shin bilaterally.  Gait and station: Gait is normal.    DIAGNOSTIC DATA (LABS, IMAGING, TESTING) - I reviewed patient records, labs, notes, testing and imaging myself where available.  Lab Results  Component Value Date   WBC 12.8 (H) 05/07/2024   HGB 14.9 05/07/2024   HCT 46.5 (H) 05/07/2024   MCV 93.8 05/07/2024   PLT 275 05/07/2024      Component Value Date/Time   NA 137 05/07/2024 1955   NA 142 03/29/2023 1043   K 4.2 05/07/2024 1955   CL 102 05/07/2024 1955   CO2 23 05/07/2024 1955   GLUCOSE 149 (H) 05/07/2024 1955   BUN 11 05/07/2024 1955   BUN 5 (L) 03/29/2023 1043   CREATININE 0.97 05/07/2024 1955   CALCIUM  9.3 05/07/2024 1955   PROT 6.8 03/29/2023 1043   ALBUMIN 4.2 03/29/2023  1043   AST 21 03/29/2023 1043   ALT 16 03/29/2023 1043   ALKPHOS 93 03/29/2023 1043   BILITOT 0.4 03/29/2023 1043   GFRNONAA >60 05/07/2024 1955   Lab Results  Component Value Date   CHOL 228 (H) 12/21/2021   HDL 89 12/21/2021   LDLCALC 122 (H) 12/21/2021   TRIG 83 12/21/2021   CHOLHDL 2.6 12/21/2021   Lab Results  Component Value Date   HGBA1C 5.8 (H) 09/13/2022   No results found for: VITAMINB12 Lab Results  Component Value Date   TSH 1.430 09/13/2022       03/29/2023   10:25 AM  MMSE - Mini Mental State Exam  Orientation to time 5  Orientation to Place 5  Registration 3  Attention/ Calculation 0  Recall 3  Language- name 2 objects 2  Language- repeat 1  Language- follow 3 step command 3  Language- read & follow direction 1  Write a sentence 1  Copy design 0  Total score 24         No data to display           ASSESSMENT AND PLAN  58 y.o. year old female  has a past medical history of Anxiety, Asthma, Hyperlipidemia (03/11/2021), and Hypertension. here with    Seizure disorder (HCC) - Plan: Valproic Acid  Level, CBC with Differential/Platelets, CMP, divalproex  (DEPAKOTE  ER) 500 MG 24 hr tablet  Right middle cerebral artery stroke (HCC)  Drug abuse (HCC)  Morning headache - Plan: Home sleep test  Witnessed episode of apnea - Plan: Home sleep test  Snoring - Plan: Home sleep test  YOHANNA TOW is doing fairly well, today. No recent seizure events. She is tolerating divalproex  and will continue 500mg  at bedtime. I will update labs, today. I will ask sleep lab to contact her regarding HST. She is completely alert and oriented, today. BP is well  managed. She was encouraged to contact PCP for refills of HTN and HLD agents. Memory compensation strategies reviewed. We have had a lengthy conversation regarding inpatient rehab options. Brandy Nguyen agrees to consider. Family will investigate. Healthy lifestyle habits encouraged. She will follow up with  PCP as directed. She will return to see me in 3-4 months. She verbalizes understanding and agreement with this plan.    Orders Placed This Encounter  Procedures   Valproic Acid  Level   CBC with Differential/Platelets   CMP   Home sleep test    Standing Status:   Future    Expiration Date:   05/16/2025    Where should this test be performed::   [facility]     Meds ordered this encounter  Medications   divalproex  (DEPAKOTE  ER) 500 MG 24 hr tablet    Sig: Take 1 tablet (500 mg total) by mouth at bedtime.    Dispense:  90 tablet    Refill:  3    Supervising Provider:   AHERN, ANTONIA B [id number]   I personally spent a total of 45 minutes in the care of the patient today including preparing to see the patient, getting/reviewing separately obtained history, performing a medically appropriate exam/evaluation, counseling and educating, placing orders, referring and communicating with other health care professionals, documenting clinical information in the EHR, independently interpreting results, communicating results, and coordinating care.   Brandy Forbes, MSN, FNP-C 05/16/2024, 11:32 AM  [facility] [address], [address] [place], [place] [address] [phone]

## 2024-05-16 NOTE — Patient Instructions (Signed)
 Below is our plan:  We will continue divalproex  500mg  daily. Please take medication consistently. We will update labs.   Please make sure you are consistent with timing of seizure medication. I recommend annual visit with primary care provider (PCP) for complete physical and routine blood work. I recommend daily intake of vitamin D (400-800iu) and calcium  (800-1000mg ) for bone health. Discuss Dexa screening with PCP.   According to [place] law, you can not drive unless you are seizure / syncope free for at least 6 months and under physician's care.  Please maintain precautions. Do not participate in activities where a loss of awareness could harm you or someone else. No swimming alone, no tub bathing, no hot tubs, no driving, no operating motorized vehicles (cars, ATVs, motocycles, etc), lawnmowers, power tools or firearms. No standing at heights, such as rooftops, ladders or stairs. Avoid hot objects such as stoves, heaters, open fires. Wear a helmet when riding a bicycle, scooter, skateboard, etc. and avoid areas of traffic. Set your water heater to 120 degrees or less.  SUDEP is the sudden, unexpected death of someone with epilepsy, who was otherwise healthy. In SUDEP cases, no other cause of death is found when an autopsy is done. Each year, more than 1 in 1,000 people with epilepsy die from SUDEP. This is the leading cause of death in people with uncontrolled seizures. Until further answers are available, the best way to prevent SUDEP is to lower your risk by controlling seizures. Research has found that people with all types of epilepsy that experience convulsive seizures can be at risk.  Please make sure you are staying well hydrated. I recommend 50-60 ounces daily. Well balanced diet and regular exercise encouraged. Consistent sleep schedule with 6-8 hours recommended.   Please continue follow up with care team as directed.   Follow up with me in 3-4 months   You may receive a survey regarding  today's visit. I encourage you to leave honest feed back as I do use this information to improve patient care. Thank you for seeing me today!

## 2024-05-16 NOTE — Telephone Encounter (Signed)
 I spoke to the patient and her sister, [place], when they with Rosaline Bohr, NP at an appointment today.  The patient is interested in residential treatment options for drug use.  I explained the options and encouraged them to call the facilities/organizations for additional information. I provided them with the following list:  [facility] [place]: [phone] or [phone]  x [phone]- this is for the clinical manager.        [facility], [place]: [phone].       [facility] ([facility]), [place]: [phone]  [facility] , [place]: [phone].    [facility] is private insurance only

## 2024-05-17 ENCOUNTER — Ambulatory Visit (INDEPENDENT_AMBULATORY_CARE_PROVIDER_SITE_OTHER): Admitting: Primary Care

## 2024-05-17 DIAGNOSIS — F1410 Cocaine abuse, uncomplicated: Secondary | ICD-10-CM

## 2024-05-17 DIAGNOSIS — E782 Mixed hyperlipidemia: Secondary | ICD-10-CM | POA: Diagnosis not present

## 2024-05-17 LAB — CBC WITH DIFFERENTIAL/PLATELET
Basophils Absolute: 0 x10E3/uL (ref 0.0–0.2)
Basos: 1 %
EOS (ABSOLUTE): 0.2 x10E3/uL (ref 0.0–0.4)
Eos: 3 %
Hematocrit: 43.2 % (ref 34.0–46.6)
Hemoglobin: 14 g/dL (ref 11.1–15.9)
Immature Grans (Abs): 0 x10E3/uL (ref 0.0–0.1)
Immature Granulocytes: 0 %
Lymphocytes Absolute: 1.7 x10E3/uL (ref 0.7–3.1)
Lymphs: 32 %
MCH: 31.3 pg (ref 26.6–33.0)
MCHC: 32.4 g/dL (ref 31.5–35.7)
MCV: 96 fL (ref 79–97)
Monocytes Absolute: 0.5 x10E3/uL (ref 0.1–0.9)
Monocytes: 9 %
Neutrophils Absolute: 2.9 x10E3/uL (ref 1.4–7.0)
Neutrophils: 55 %
Platelets: 253 x10E3/uL (ref 150–450)
RBC: 4.48 x10E6/uL (ref 3.77–5.28)
RDW: 14.1 % (ref 11.7–15.4)
WBC: 5.3 x10E3/uL (ref 3.4–10.8)

## 2024-05-17 LAB — COMPREHENSIVE METABOLIC PANEL WITH GFR
ALT: 15 IU/L (ref 0–32)
AST: 21 IU/L (ref 0–40)
Albumin: 4.4 g/dL (ref 3.8–4.9)
Alkaline Phosphatase: 82 IU/L (ref 44–121)
BUN/Creatinine Ratio: 9 — ABNORMAL LOW (ref 12–28)
BUN: 9 mg/dL (ref 8–27)
Bilirubin Total: 0.2 mg/dL (ref 0.0–1.2)
CO2: 27 mmol/L (ref 20–29)
Calcium: 10.2 mg/dL (ref 8.7–10.3)
Chloride: 99 mmol/L (ref 96–106)
Creatinine, Ser: 1.01 mg/dL — ABNORMAL HIGH (ref 0.57–1.00)
Globulin, Total: 2.4 g/dL (ref 1.5–4.5)
Glucose: 68 mg/dL — ABNORMAL LOW (ref 70–99)
Potassium: 3.9 mmol/L (ref 3.5–5.2)
Sodium: 140 mmol/L (ref 134–144)
Total Protein: 6.8 g/dL (ref 6.0–8.5)
eGFR: 64 mL/min/1.73 (ref >59)

## 2024-05-17 LAB — VALPROIC ACID LEVEL: Valproic Acid Lvl: 24 ug/mL — ABNORMAL LOW (ref 50–100)

## 2024-05-18 ENCOUNTER — Telehealth: Payer: Self-pay

## 2024-05-18 ENCOUNTER — Ambulatory Visit (INDEPENDENT_AMBULATORY_CARE_PROVIDER_SITE_OTHER): Payer: Self-pay | Admitting: Primary Care

## 2024-05-18 LAB — LIPID PANEL
Chol/HDL Ratio: 2.7 ratio (ref 0.0–4.4)
Cholesterol, Total: 181 mg/dL (ref 100–199)
HDL: 67 mg/dL (ref >39)
LDL Chol Calc (NIH): 104 mg/dL — ABNORMAL HIGH (ref 0–99)
Triglycerides: 50 mg/dL (ref 0–149)
VLDL Cholesterol Cal: 10 mg/dL (ref 5–40)

## 2024-05-18 NOTE — Telephone Encounter (Signed)
 Patient was called and informed of lab results and she is asking if she still needs to take her Lipitor, if so she is needing a refill.

## 2024-05-21 ENCOUNTER — Telehealth: Payer: Self-pay | Admitting: Neurology

## 2024-05-21 ENCOUNTER — Other Ambulatory Visit: Payer: Self-pay

## 2024-05-21 ENCOUNTER — Other Ambulatory Visit (INDEPENDENT_AMBULATORY_CARE_PROVIDER_SITE_OTHER): Payer: Self-pay | Admitting: Primary Care

## 2024-05-21 DIAGNOSIS — I639 Cerebral infarction, unspecified: Secondary | ICD-10-CM

## 2024-05-21 DIAGNOSIS — I63511 Cerebral infarction due to unspecified occlusion or stenosis of right middle cerebral artery: Secondary | ICD-10-CM

## 2024-05-21 MED ORDER — ATORVASTATIN CALCIUM 10 MG PO TABS
10.0000 mg | ORAL_TABLET | Freq: Every day | ORAL | 1 refills | Status: DC
Start: 2024-05-21 — End: 2024-05-22
  Filled 2024-05-21: qty 90, 90d supply, fill #0

## 2024-05-21 NOTE — Telephone Encounter (Signed)
 Called the patient and there was no answer. Left a detailed message advising the lab result and recommendation per Dr Onita in Amy's absence. Instructed the patient to call back with any questions.

## 2024-05-21 NOTE — Telephone Encounter (Signed)
 Please call patient, laboratory evaluation showed:  -Depakote  level 24, subtherapeutic level, if there is no recurrent seizure, keep current medications  -Normal CBC -Mild elevation of creatinine, he would benefit increase water intake

## 2024-05-21 NOTE — Telephone Encounter (Signed)
 Called patient unable to make contact, voicemail has been left

## 2024-05-22 ENCOUNTER — Other Ambulatory Visit (INDEPENDENT_AMBULATORY_CARE_PROVIDER_SITE_OTHER): Payer: Self-pay | Admitting: Primary Care

## 2024-05-22 ENCOUNTER — Telehealth: Payer: Self-pay | Admitting: Family Medicine

## 2024-05-22 DIAGNOSIS — I639 Cerebral infarction, unspecified: Secondary | ICD-10-CM

## 2024-05-22 DIAGNOSIS — I63511 Cerebral infarction due to unspecified occlusion or stenosis of right middle cerebral artery: Secondary | ICD-10-CM

## 2024-05-22 NOTE — Telephone Encounter (Signed)
 Copied from CRM #[id number]. Topic: Clinical - Medication Refill >> May 22, 2024  3:35 PM Pinkey ORN wrote: Medication: atorvastatin  (LIPITOR) 10 MG tablet  Has the patient contacted their pharmacy? Yes (Agent: If no, request that the patient contact the pharmacy for the refill. If patient does not wish to contact the pharmacy document the reason why and proceed with request.) (Agent: If yes, when and what did the pharmacy advise?)  This is the patient's preferred pharmacy:   [facility] [place], [place] - [address] [place] [address] Phone: [phone] Fax: [phone]  Is this the correct pharmacy for this prescription? Yes If no, delete pharmacy and type the correct one.   Has the prescription been filled recently? No  Is the patient out of the medication? Yes  Has the patient been seen for an appointment in the last year OR does the patient have an upcoming appointment? Yes  Can we respond through MyChart? Yes  Agent: Please be advised that Rx refills may take up to 3 business days. We ask that you follow-up with your pharmacy.

## 2024-05-22 NOTE — Telephone Encounter (Signed)
 Started a caseon 05/18/24 [facility] blue pending HST.

## 2024-05-24 ENCOUNTER — Other Ambulatory Visit: Payer: Self-pay

## 2024-05-24 MED ORDER — ATORVASTATIN CALCIUM 10 MG PO TABS: 10.0000 mg | ORAL_TABLET | Freq: Every day | ORAL | 1 refills | Status: AC

## 2024-05-24 NOTE — Telephone Encounter (Signed)
 Change in pharmacy- Rx forwarded Requested Prescriptions  Pending Prescriptions Disp Refills   atorvastatin  (LIPITOR) 10 MG tablet 90 tablet 1    Sig: Take 1 tablet (10 mg total) by mouth daily.     Cardiovascular:  Antilipid - Statins Failed - 05/24/2024 11:06 AM      Failed - Lipid Panel in normal range within the last 12 months    Cholesterol, Total  Date Value Ref Range Status  05/17/2024 181 100 - 199 mg/dL Final   LDL Chol Calc (NIH)  Date Value Ref Range Status  05/17/2024 104 (H) 0 - 99 mg/dL Final   HDL  Date Value Ref Range Status  05/17/2024 67 >39 mg/dL Final   Triglycerides  Date Value Ref Range Status  05/17/2024 50 0 - 149 mg/dL Final         Passed - Patient is not pregnant      Passed - Valid encounter within last 12 months    Recent Outpatient Visits           1 week ago Cocaine abuse (HCC)   [place] [facility] Celestia Rosaline SQUIBB, NP   2 weeks ago Essential hypertension   [place] [facility] Celestia Rosaline SQUIBB, NP   5 months ago Essential hypertension   [place] [facility] Celestia Rosaline SQUIBB, NP   9 months ago Influenza vaccination declined   [place] [facility] Celestia Rosaline SQUIBB, NP   1 year ago Cervical cancer screening   [place] [facility] Celestia Rosaline SQUIBB, NP

## 2024-05-25 ENCOUNTER — Other Ambulatory Visit: Payer: Self-pay

## 2024-05-27 NOTE — Progress Notes (Signed)
 [facility]  Brandy Nguyen, is a 58 y.o. female  RDW:[id number]  FMW:[id number]  DOB - [birth year]  None      Subjective:   Brandy Nguyen is a 58 y.o. female here today for an acute visit.PCP requested a follow up from last OV where discussion of the use of cocaine and being around people..  Sister has been present at her appointments trying to help navigate her life in a different direction and finding different.  Living environment Patient states she has been clean since her last visit and plans on continuing.  Our clinical nurse managerr is also aware of the situation and has provided information and talk with patient and sister.   No problems updated.  Comprehensive ROS Pertinent positive and negative noted in HPI   No Known Allergies  Past Medical History:  Diagnosis Date   Anxiety    Asthma    Hyperlipidemia 03/11/2021   Hypertension     Current Outpatient Medications on File Prior to Visit  Medication Sig Dispense Refill   ALPRAZolam  (XANAX ) 0.25 MG tablet Take 1 tablet (0.25 mg total) by mouth at bedtime as needed for sleep (for sleep study). (Patient not taking: Reported on 05/16/2024) 2 tablet 0   amLODipine  (NORVASC ) 10 MG tablet Take 1 tablet (10 mg total) by mouth daily. 90 tablet 1   aspirin  81 MG chewable tablet Chew 1 tablet (81 mg total) by mouth daily.     divalproex  (DEPAKOTE  ER) 500 MG 24 hr tablet Take 1 tablet (500 mg total) by mouth at bedtime. 90 tablet 3   docusate sodium  (COLACE) 100 MG capsule Take 1 capsule (100 mg total) by mouth 2 (two) times daily. 100 capsule 11   No current facility-administered medications on file prior to visit.   Health Maintenance  Topic Date Due   Hepatitis C Screening  Never done   Colon Cancer Screening  Never done   COVID-19 Vaccine (2 - 2024-25 season) 06/05/2023   Flu Shot  01/01/2025*   Pneumococcal Vaccine for age over 24 (1 of 2 - PCV) 05/10/2025*   Mammogram  06/01/2025   Pap with HPV  screening  02/17/2028   DTaP/Tdap/Td vaccine (2 - Td or Tdap) 02/16/2033   HIV Screening  Completed   Zoster (Shingles) Vaccine  Completed   Hepatitis B Vaccine  Aged Out   HPV Vaccine  Aged Out   Meningitis B Vaccine  Aged Out  *Topic was postponed. The date shown is not the original due date.    Objective:   There were no vitals filed for this visit. BP Readings from Last 3 Encounters:  05/16/24 103/67  05/16/24 129/75  05/10/24 126/86      Physical Exam Constitutional:      Appearance: She is obese.  HENT:     Head: Normocephalic.     Nose: Nose normal.  Cardiovascular:     Rate and Rhythm: Normal rate and regular rhythm.  Pulmonary:     Effort: Pulmonary effort is normal.     Breath sounds: Normal breath sounds.  Abdominal:     General: Bowel sounds are normal. There is distension.     Palpations: Abdomen is soft.  Musculoskeletal:     Cervical back: Normal range of motion.  Neurological:     Mental Status: She is oriented to person, place, and time. Mental status is at baseline.  Psychiatric:        Mood and Affect: Mood normal.  Behavior: Behavior normal.     Assessment & Plan  Diagnoses and all orders for this visit:  Cocaine abuse (HCC) F/u , plan and if continue usage. Patient is aware of UDS on follow up visit I'm messy     Patient have been counseled extensively about nutrition and exercise. Other issues discussed during this visit include: low cholesterol diet, weight control and daily exercise, foot care, annual eye examinations at Ophthalmology, importance of adherence with medications and regular follow-up. We also discussed long term complications of uncontrolled diabetes and hypertension.   Return in about 2 months (around 07/17/2024) for medical conditions, fasting labs.  The patient was given clear instructions to go to ER or return to medical center if symptoms don't improve, worsen or new problems develop. The patient verbalized  understanding. The patient was told to call to get lab results if they haven't heard anything in the next week.   This note has been created with Education officer, environmental. Any transcriptional errors are unintentional.   Brandy SHAUNNA Bohr, NP 05/27/2024, 4:24 PM

## 2024-05-28 NOTE — Telephone Encounter (Signed)
 HST M[facility] healthy blue no auth req via fax.

## 2024-05-29 NOTE — Telephone Encounter (Signed)
 HST- M[facility] healthy blue no auth req via fax   Patient is scheduled at [facility] for 06/12/24 at 8 AM.  Mailed packet.

## 2024-06-05 ENCOUNTER — Other Ambulatory Visit: Payer: Self-pay

## 2024-06-05 ENCOUNTER — Ambulatory Visit
Admission: RE | Admit: 2024-06-05 | Discharge: 2024-06-05 | Disposition: A | Discharge status: Home / Self Care | Source: Ambulatory Visit | Attending: Primary Care | Admitting: Primary Care

## 2024-06-05 DIAGNOSIS — Z1231 Encounter for screening mammogram for malignant neoplasm of breast: Secondary | ICD-10-CM

## 2024-06-06 ENCOUNTER — Telehealth (INDEPENDENT_AMBULATORY_CARE_PROVIDER_SITE_OTHER): Payer: Self-pay | Admitting: Primary Care

## 2024-06-06 NOTE — Telephone Encounter (Signed)
 Copied from CRM #[id number]. Topic: Clinical - Medical Advice >> Jun 06, 2024  2:33 PM Ivette P wrote: Reason for CRM: pt calling in because she was being prescribed a medication and pt did not want to take medication and has some concerns about how the medication would have an effect on her.   Pt would like to speak to provider or nurse.

## 2024-06-06 NOTE — Telephone Encounter (Signed)
 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   She doesn't think she needs it but was advised to speak with PCP to take something for anxiety: Vistaril/ Hydroxyzine.

## 2024-06-11 NOTE — Progress Notes (Signed)
 Pt received blood pressure screening. Pt has PCP and no SDOH needs at this time.

## 2024-06-12 ENCOUNTER — Encounter

## 2024-07-11 ENCOUNTER — Encounter: Payer: Self-pay | Admitting: Physician Assistant

## 2024-07-11 ENCOUNTER — Ambulatory Visit: Admitting: Physician Assistant

## 2024-07-11 VITALS — BP 130/70 | HR 60 | Ht 59.0 in | Wt 151.0 lb

## 2024-07-11 DIAGNOSIS — M25511 Pain in right shoulder: Secondary | ICD-10-CM | POA: Diagnosis not present

## 2024-07-11 DIAGNOSIS — R079 Chest pain, unspecified: Secondary | ICD-10-CM

## 2024-07-11 MED ORDER — IBUPROFEN 800 MG PO TABS
800.0000 mg | ORAL_TABLET | Freq: Three times a day (TID) | ORAL | 0 refills | Status: AC | PRN
Start: 2024-07-11 — End: ?
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
## 2024-07-11 NOTE — Patient Instructions (Signed)
 Please have Right shoulder xray performed. We will call you with the results. You can take Ibuprofen 800mg  q8 PRN for pain.   Shoulder Pain Many things can cause shoulder pain, including: An injury to the shoulder. Overuse of the shoulder. Arthritis. The source of the pain can be: Inflammation. An injury to the shoulder joint. An injury to a tendon, ligament, or bone. Follow these instructions at home: Pay attention to changes in your symptoms. Let your health care provider know about them. Follow these instructions to relieve your pain. If you have a removable sling: Wear the sling as told by your provider. Remove it only as told by your provider. Check the skin around the sling every day. Tell your provider about any concerns. Loosen the sling if your fingers tingle, become numb, or become cold. Keep the sling clean. If the sling is not waterproof: Do not let it get wet. Remove it to shower or bathe. Move your arm as little as possible, but keep your hand moving to prevent swelling. Managing pain, stiffness, and swelling  If told, put ice on the painful area. If you have a removable sling or immobilizer, remove it as told by your provider. Put ice in a plastic bag. Place a towel between your skin and the bag. Leave the ice on for 20 minutes, 2-3 times a day. If your skin turns bright red, remove the ice right away to prevent skin damage. The risk of damage is higher if you cannot feel pain, heat, or cold. Move your fingers often to reduce stiffness and swelling. Squeeze a soft ball or a foam pad as much as possible. This helps to keep the shoulder from swelling. It also helps to strengthen the arm. General instructions Take over-the-counter and prescription medicines only as told by your provider. Exercise may help with pain management. Perform exercises if told by your provider. You may be referred to a physical therapist to help in your recovery process. Keep all follow-up  visits in order to avoid any type of permanent shoulder disability or chronic pain problems. Contact a health care provider if: Your pain is not relieved with medicines. New pain develops in your arm, hand, or fingers. You loosen your sling and your arm, hand, or fingers remain tingly, numb, swollen, or painful. Get help right away if: Your arm, hand, or fingers turn white or blue. This information is not intended to replace advice given to you by your health care provider. Make sure you discuss any questions you have with your health care provider. Document Revised: 04/23/2022 Document Reviewed: 04/23/2022 Elsevier Patient Education  2024 [employer].

## 2024-07-14 ENCOUNTER — Encounter (HOSPITAL_COMMUNITY): Payer: Self-pay | Admitting: Pharmacy Technician

## 2024-07-14 ENCOUNTER — Emergency Department (HOSPITAL_COMMUNITY)
Admission: EM | Admit: 2024-07-14 | Discharge: 2024-07-14 | Disposition: A | Discharge status: Home / Self Care | Attending: Emergency Medicine | Admitting: Emergency Medicine

## 2024-07-14 ENCOUNTER — Other Ambulatory Visit: Payer: Self-pay

## 2024-07-14 ENCOUNTER — Emergency Department (HOSPITAL_COMMUNITY)

## 2024-07-14 DIAGNOSIS — Z7982 Long term (current) use of aspirin: Secondary | ICD-10-CM | POA: Insufficient documentation

## 2024-07-14 DIAGNOSIS — R110 Nausea: Secondary | ICD-10-CM | POA: Insufficient documentation

## 2024-07-14 DIAGNOSIS — Z79899 Other long term (current) drug therapy: Secondary | ICD-10-CM | POA: Diagnosis not present

## 2024-07-14 DIAGNOSIS — R531 Weakness: Secondary | ICD-10-CM | POA: Diagnosis not present

## 2024-07-14 LAB — BASIC METABOLIC PANEL WITH GFR
Anion gap: 10 (ref 5–15)
BUN: 7 mg/dL (ref 6–20)
CO2: 24 mmol/L (ref 22–32)
Calcium: 9.1 mg/dL (ref 8.9–10.3)
Chloride: 103 mmol/L (ref 98–111)
Creatinine, Ser: 0.96 mg/dL (ref 0.44–1.00)
GFR, Estimated: >60 mL/min (ref >60)
Glucose, Bld: 109 mg/dL — ABNORMAL HIGH (ref 70–99)
Potassium: 3.8 mmol/L (ref 3.5–5.1)
Sodium: 137 mmol/L (ref 135–145)

## 2024-07-14 LAB — RAPID URINE DRUG SCREEN, HOSP PERFORMED
Amphetamines: NOT DETECTED
Barbiturates: NOT DETECTED
Benzodiazepines: NOT DETECTED
Cocaine: NOT DETECTED
Opiates: NOT DETECTED
Tetrahydrocannabinol: NOT DETECTED

## 2024-07-14 LAB — URINALYSIS, W/ REFLEX TO CULTURE (INFECTION SUSPECTED)
Bacteria, UA: NONE SEEN
Bilirubin Urine: NEGATIVE
Glucose, UA: NEGATIVE mg/dL
Hgb urine dipstick: NEGATIVE
Ketones, ur: NEGATIVE mg/dL
Leukocytes,Ua: NEGATIVE
Nitrite: NEGATIVE
Protein, ur: NEGATIVE mg/dL
Specific Gravity, Urine: 1.004 — ABNORMAL LOW (ref 1.005–1.030)
pH: 7 (ref 5.0–8.0)

## 2024-07-14 LAB — VALPROIC ACID LEVEL: Valproic Acid Lvl: 53 ug/mL (ref 50–100)

## 2024-07-14 LAB — TROPONIN I (HIGH SENSITIVITY)
Troponin I (High Sensitivity): 3 ng/L (ref <18)
Troponin I (High Sensitivity): 3 ng/L (ref <18)

## 2024-07-14 LAB — CBC
HCT: 39.1 % (ref 36.0–46.0)
Hemoglobin: 13.2 g/dL (ref 12.0–15.0)
MCH: 30.8 pg (ref 26.0–34.0)
MCHC: 33.8 g/dL (ref 30.0–36.0)
MCV: 91.1 fL (ref 80.0–100.0)
Platelets: 239 K/uL (ref 150–400)
RBC: 4.29 MIL/uL (ref 3.87–5.11)
RDW: 11.9 % (ref 11.5–15.5)
WBC: 7.5 K/uL (ref 4.0–10.5)
nRBC: 0 % (ref 0.0–0.2)

## 2024-07-14 MED ORDER — ONDANSETRON 4 MG PO TBDP
4.0000 mg | ORAL_TABLET | Freq: Three times a day (TID) | ORAL | 0 refills | Status: AC | PRN
  Filled 2024-07-14: qty 20, 7d supply, fill #0

## 2024-07-14 NOTE — ED Provider Triage Note (Signed)
 Emergency Medicine Provider Triage Evaluation Note  Brandy Nguyen , a 58 y.o. female  was evaluated in triage.  Pt complains of seizure.  History of same, takes Depakote  and endorses compliance.  Reports symptoms began this morning. Reports she had 2 seizures today. Reports seizure activity consistent with her baseline seizures. 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  Reports when she has breakthrough seizures her left side becomes more weak than it is normally.  She is unsure how long the worsening weakness normally lasts as she states she normally goes to sleep after she has breakthrough seizures and wakes up several hours later with symptomatic improvement.  Her last breakthrough seizure was in August.  Her friend at the bedside was concerned that she could have had a new stroke and insisted that she be evaluated.  Patient is adamant that her deficits are exactly the same as they are at her baseline.  She denies any headaches or vision changes.  She is alert and oriented.  She denies any current complaints.    Review of Systems  Positive:  Negative:   Physical Exam  BP (!) 144/84   Pulse 72   Temp 99 F (37.2 C)   Resp 16   SpO2 99%  Gen:   Awake, no distress   Resp:  Normal effort  MSK:   Moves extremities without difficulty  Other:  Left sided arm and leg weakness, 4/5 which patient is again adamant is baseline for her.  Alert and oriented and no other focal neurologic deficits  Medical Decision Making  Medically screening exam initiated at 12:39 PM.  Appropriate orders placed.  Brandy Nguyen was informed that the remainder of the evaluation will be completed by another provider, this initial triage assessment does not replace that evaluation, and the importance of remaining in the ED until their evaluation is complete.  Neurology notes reviewed, does look like patient has residual left-sided weakness from a stroke in 2022.    Nora Lauraine LABOR, PA-C 07/14/24 1244

## 2024-07-14 NOTE — ED Triage Notes (Signed)
 Pt states she has a history of seizures where her L side shakes and then she has weakness and numbness to her L side for several hours. She states the same happened at 09:30, lasting several minutes.  Her friend saw it for the first time and wanted to make sure she wasn't having a stroke.  Pt is adamant that this is normal for her.

## 2024-07-14 NOTE — ED Provider Notes (Signed)
 [place] EMERGENCY DEPARTMENT AT [facility] Provider Note   CSN: [id number] Arrival date & time: 07/14/24  1135     Patient presents with: Seizures   Brandy Nguyen is a 58 y.o. female.   58 year old female with prior medical history of seizure, CVA.  Patient reports that she is compliant with Depakote .  This morning she reports that she had symptoms concerning for possible seizure.  She felt like she was about to have a seizure.  She did not actually have seizure activity.  She had no syncope.  She reports that she had 2-3 episodes of nausea, generalized weakness, unsteady gait.  She has no symptoms now.  She reports that the symptoms precede her seizure.  However she did not actually have a seizure.  At time of evaluation she denies current complaint.  The history is provided by the patient and medical records.       Prior to Admission medications   Medication Sig Start Date End Date Taking? Authorizing Provider  ALPRAZolam  (XANAX ) 0.25 MG tablet Take 1 tablet (0.25 mg total) by mouth at bedtime as needed for sleep (for sleep study). Patient not taking: Reported on 07/11/2024 06/21/23   Dohmeier, Dedra, MD  amLODipine  (NORVASC ) 10 MG tablet Take 1 tablet (10 mg total) by mouth daily. 05/16/24   Celestia Rosaline SQUIBB, NP  aspirin  81 MG chewable tablet Chew 1 tablet (81 mg total) by mouth daily. 03/17/21   Autry-Lott, Rojean, DO  atorvastatin  (LIPITOR) 10 MG tablet Take 1 tablet (10 mg total) by mouth daily. 05/24/24   Celestia Rosaline SQUIBB, NP  divalproex  (DEPAKOTE  ER) 500 MG 24 hr tablet Take 1 tablet (500 mg total) by mouth at bedtime. 05/16/24   Lomax, Amy, NP  docusate sodium  (COLACE) 100 MG capsule Take 1 capsule (100 mg total) by mouth 2 (two) times daily. Patient not taking: Reported on 07/11/2024 05/16/24   Celestia Rosaline SQUIBB, NP  ibuprofen (ADVIL) 800 MG tablet Take 1 tablet (800 mg total) by mouth every 8 (eight) hours as needed. 07/11/24   Mayers, Cari S, PA-C     Allergies: Patient has no known allergies.    Review of Systems  All other systems reviewed and are negative.   Updated Vital Signs BP (!) 144/84   Pulse 72   Temp 99 F (37.2 C)   Resp 16   SpO2 99%   Physical Exam Vitals and nursing note reviewed.  Constitutional:      General: She is not in acute distress.    Appearance: Normal appearance. She is well-developed.  HENT:     Head: Normocephalic and atraumatic.  Eyes:     Conjunctiva/sclera: Conjunctivae normal.     Pupils: Pupils are equal, round, and reactive to light.  Cardiovascular:     Rate and Rhythm: Normal rate and regular rhythm.     Heart sounds: Normal heart sounds.  Pulmonary:     Effort: Pulmonary effort is normal. No respiratory distress.     Breath sounds: Normal breath sounds.  Abdominal:     General: There is no distension.     Palpations: Abdomen is soft.     Tenderness: There is no abdominal tenderness.  Musculoskeletal:        General: No deformity. Normal range of motion.     Cervical back: Normal range of motion and neck supple.  Skin:    General: Skin is warm and dry.  Neurological:     General: No focal deficit present.  Mental Status: She is alert and oriented to person, place, and time.     (all labs ordered are listed, but only abnormal results are displayed) Labs Reviewed  BASIC METABOLIC PANEL WITH GFR - Abnormal; Notable for the following components:      Result Value   Glucose, Bld 109 (*)    All other components within normal limits  CBC  VALPROIC ACID  LEVEL    EKG: None  Radiology: No results found.   Procedures   Medications Ordered in the ED - No data to display                                  Medical Decision Making Patient is presenting with complaint of nausea, weakness.  She reports that these symptoms can precede a seizure.  She did not actually have a seizure per her report.  She appears to be compliant with her previously prescribed  Depakote .  EKG is without clear signs of ischemia.  Troponins are without significant elevation.  Other obtained labs and imaging are also without acute abnormality.  Patient is asymptomatic during her evaluation.  She desires discharge.  She understands need for close outpatient follow-up.  Strict return precautions given and understood.  Amount and/or Complexity of Data Reviewed Labs: ordered. Radiology: ordered.  Risk Prescription drug management.        Final diagnoses:  Nausea    ED Discharge Orders     None          Laurice Maude BROCKS, MD 07/14/24 2037

## 2024-07-14 NOTE — Discharge Instructions (Addendum)
 Return for any problem.  Take your Depakote  as prescribed.  Do not miss any doses.  Avoid using cocaine.  Cocaine is very bad for you.  If needed, you have been prescribed some Zofran for nausea.  If you develop worsening symptoms such as chest pain, seizure activity, vomiting, fever please return to the ED for evaluation.

## 2024-07-16 ENCOUNTER — Other Ambulatory Visit: Payer: Self-pay

## 2024-07-24 NOTE — Progress Notes (Signed)
 Pt attended 07/24/24 screening event where her bp was 125/74. Pt documented having a PCP and documented none for insurance. Pt noted that she is a smoker and no SDOH was documented.

## 2024-07-25 ENCOUNTER — Other Ambulatory Visit: Payer: Self-pay

## 2024-07-27 NOTE — Progress Notes (Signed)
 The patient attended a screening event on 06/11/2024 where her BP screening results was 128/78. At the event the patient noted she has [place] Medicaid [employer] and pt does smoke tobacco. Patient did not have any SDOH insecurities. Pt listed pcp as Dr. [name]. Per chart review pt has a pcp and the last office visit was 05/16/2024 for hypertension. The pt BP was 103/67 on 05/16/2024. According to chart pt has been counseled extensively about nutrition and exercise by pcp on 05/16/2024. Chart review indicates pt is currently on amlodipine  to manage blood pressure and atorvastatin  to lower cholesterol. Chart review also indicates a future appt with pcp on 08/13/2024. No additional Health equity team support indicated at this time.

## 2024-08-08 ENCOUNTER — Telehealth: Payer: Self-pay | Admitting: Family Medicine

## 2024-08-08 NOTE — Telephone Encounter (Signed)
 Phone room: please call pt back. Refill already sent 05/16/24 #90 and 3 refills. He should call pharmacy to process refill.

## 2024-08-08 NOTE — Telephone Encounter (Signed)
 Pt is requesting a refill for divalproex  (DEPAKOTE  ER) 500 MG 24 hr tablet .  Pharmacy: [facility]

## 2024-08-13 ENCOUNTER — Ambulatory Visit (INDEPENDENT_AMBULATORY_CARE_PROVIDER_SITE_OTHER): Admitting: Primary Care

## 2024-08-14 NOTE — Telephone Encounter (Signed)
 Phone rep called pt, she confirmed she has picked up the medication

## 2024-08-20 ENCOUNTER — Encounter: Payer: Self-pay | Admitting: Internal Medicine

## 2024-08-20 ENCOUNTER — Telehealth (INDEPENDENT_AMBULATORY_CARE_PROVIDER_SITE_OTHER): Payer: Self-pay | Admitting: Primary Care

## 2024-08-20 NOTE — Telephone Encounter (Signed)
 Copied from CRM #[id number]. Topic: Medical Record Request - Other >> Aug 20, 2024  8:22 AM Robinson H wrote: Reason for CRM: Patient asking if dental office [facility] reached out to clarify what medications she's taking since she has to have oral surgery and office has to verify medication before she can schedule.  [facility] [phone] [facility] [phone]

## 2024-08-22 ENCOUNTER — Other Ambulatory Visit: Payer: Self-pay | Admitting: Physician Assistant

## 2024-09-11 ENCOUNTER — Ambulatory Visit: Admitting: Family Medicine

## 2024-09-11 NOTE — Progress Notes (Signed)
 Pt attended 07/24/2024 screening event with BP of 125/74.   Per chart review pt does have a PCP Everlina Bohr; [facility]), insurance, and is a smoker. Pt's last appt with PCP was 05/16/2024 and has an upcoming appt on 09/17/2024 Pt does not indicate any SDOH needs at this time.  No additional pt f/u to be scheduled at this time per health equity protocol.

## 2024-09-12 ENCOUNTER — Encounter: Payer: Self-pay | Admitting: Family Medicine

## 2024-09-12 ENCOUNTER — Ambulatory Visit: Payer: MEDICAID | Admitting: Family Medicine

## 2024-09-12 VITALS — BP 137/89 | HR 69 | Ht 59.0 in | Wt 169.5 lb

## 2024-09-12 DIAGNOSIS — I63511 Cerebral infarction due to unspecified occlusion or stenosis of right middle cerebral artery: Secondary | ICD-10-CM | POA: Diagnosis not present

## 2024-09-12 DIAGNOSIS — R519 Headache, unspecified: Secondary | ICD-10-CM

## 2024-09-12 DIAGNOSIS — R0683 Snoring: Secondary | ICD-10-CM | POA: Diagnosis not present

## 2024-09-12 DIAGNOSIS — F1910 Other psychoactive substance abuse, uncomplicated: Secondary | ICD-10-CM

## 2024-09-12 DIAGNOSIS — G40909 Epilepsy, unspecified, not intractable, without status epilepticus: Secondary | ICD-10-CM

## 2024-09-12 DIAGNOSIS — I639 Cerebral infarction, unspecified: Secondary | ICD-10-CM | POA: Diagnosis not present

## 2024-09-12 NOTE — Patient Instructions (Signed)
 Below is our plan:  We will continue divalproex  ER 500mg  daily. I will order sleep study. Listen for a call from the sleep lab. Please answer and schedule testing. I will let you know what the testing shows.   Goals:  1) Maintain strict control of hypertension with blood pressure goal below 130/90 2) Maintain good control of diabetes with hemoglobin A1c goal below 7%  3) Maintain good control of lipids with LDL cholesterol goal below 70 mg/dL.  4) Eat a healthy diet with plenty of whole grains, cereals, fruits and vegetables, exercise regularly and maintain ideal body weight   Resources: https://www.williams.biz/   Please make sure you are consistent with timing of seizure medication. I recommend annual visit with primary care provider (PCP) for complete physical and routine blood work. I recommend daily intake of vitamin D (400-800iu) and calcium  (800-1000mg ) for bone health. Discuss Dexa screening with PCP.   According to [place] law, you can not drive unless you are seizure / syncope free for at least 6 months and under physician's care.  Please maintain precautions. Do not participate in activities where a loss of awareness could harm you or someone else. No swimming alone, no tub bathing, no hot tubs, no driving, no operating motorized vehicles (cars, ATVs, motocycles, etc), lawnmowers, power tools or firearms. No standing at heights, such as rooftops, ladders or stairs. Avoid hot objects such as stoves, heaters, open fires. Wear a helmet when riding a bicycle, scooter, skateboard, etc. and avoid areas of traffic. Set your water heater to 120 degrees or less.  SUDEP is the sudden, unexpected death of someone with epilepsy, who was otherwise healthy. In SUDEP cases, no other cause of death is found when an autopsy is done. Each year, more than 1 in 1,000 people with epilepsy die from SUDEP. This is the leading cause of death in  people with uncontrolled seizures. Until further answers are available, the best way to prevent SUDEP is to lower your risk by controlling seizures. Research has found that people with all types of epilepsy that experience convulsive seizures can be at risk.  Please make sure you are staying well hydrated. I recommend 50-60 ounces daily. Well balanced diet and regular exercise encouraged. Consistent sleep schedule with 6-8 hours recommended.   Please continue follow up with care team as directed.   Follow up with me in pending sleep study   You may receive a survey regarding today's visit. I encourage you to leave honest feed back as I do use this information to improve patient care. Thank you for seeing me today!

## 2024-09-12 NOTE — Progress Notes (Signed)
 Chief Complaint  Patient presents with   RM1/SEIZURES/CVA    Pt is here Alone. Pt states she has been doing okay since her last Appointment.     HISTORY OF PRESENT ILLNESS:  09/12/2024 ALL: Brandy Nguyen returns for follow up for seizures. She was last seen 05/2024 following breakthrough seizure activity. We continued divalproex  500mg  daily. HST ordered by Dr Chalice 06/2023 but was not completed.   Since, she reports doing well. She denies seizures. She is taking ASM daily as prescribed. She is nearly 4 months sober. She completed ADS. Now with [employer]. 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 She continues to go to meetings M-F. NO s/s of stroke. She continues asa and atorvastatin . She has follow up with PCP next week. She does want to have HST. She snores and has morning headaches.   05/16/2024 ALL: Brandy Nguyen returns for follow up for seizures. She was last seen by me 03/2023. She reported compliance with ASM and denied seizure activity. Referral placed to sleep med for concerns of sleep apnea. She was seen by Dr Chalice 06/2023. HST ordered but has not been completed. She reported non compliance with ASM regimen at that time. She was seen in the ER 05/2024 for breakthrough seizures. Valproic acid  level was 40.   Since, she reports being more consistent with ASM regimen. She tells me that she has not taken 1000mg  in over a year. She reports stinging of her brain with this dose. She usually takes divalproex  500mg  daily when she is actively compliant with medication regimen. She denies seizure activity on this dose when taking consistently. PCP refilled divalproex  ER 500mg  daily. She feels 500mg  is better tolerated. She is actively battling cocaine addiction. She reports being sober at this time. Brandy Nguyen, sister, presents with her and aids in history. She is helping her get to meetings several times a week. She lives alone in an apartment. Brandy Nguyen reports that it is not in the best part  of town and surrounded by drugs. [name] daughter and brother are helping her care for her sister. They are encouraging her to seek medical assisted rehab.   She is not taking amlodipine  or atorvastatin . She reports pharmacy advised she did not have active prescription. She was seen by PCP last week and reports being told to continue these.   06/21/2023 CD:  Brandy Nguyen is a 58 y.o. female patient who is seen upon referral on 06/21/2023 from Dr Onita for a sleep evaluation. .  Chief concern according to patient :  I am very sleepy, fatigue is high, I wake up for the bathroom 4-5  times, and I am in pain- neck and left arm and shoulder. Sometimes shooting pain into legs and needle stings in my feet.    I have the pleasure of seeing Brandy Nguyen 06/21/23 a right-handed AA female with a possible sleep disorder.    The patient had seen Greig Forbes, NP in June : She suffered stroke in June 2022, presenting with left-sided weakness, seizure-like activity, personally reviewed MRI of the brain with without contrast on March 10, 2021, large acute or early subacute right posterior MCA infarction cortical edema, extensive petechiae hemorrhage, gyriform enhancement likely related to blood-brain barrier breakdown, UDS was positive for cocaine Echocardiogram hyperdynamic no wall regional motion abnormality CT angiogram head and neck no large vessel disease   Hospital admission in March 2023 for partial seizure, left-sided body jerking movement, Todd's paralysis, EEG was unremarkable, Laboratory evaluation showed  UDS positive for cocaine, benzodiazepines She was started on Keppra  during hospital stay, experienced psychosis with aggressive behavior, but psychosis resolved by day 3 of hospitalization, was seen by neurohospitalist, was switched to Depakote  DR 500 mg twice a day, Personally reviewed MRI of the brain, no acute abnormality, right posterior MCA infarction, encephalomalacia   She continues frustrated  by her left-sided body symptoms, weakness, sensory loss, not compliant with her Depakote  DR  03/29/2023 ALL:  Brandy Nguyen is a 58 y.o. female here today for follow up for  history of CVA and seizures on divalproex . She was seen in consult with Dr Onita 09/2022. EEG unremarkable. She was missing ASM with BID dosing and was switched to 1000mg  at bedtime.   Since, she reports doing fairly well. No seizure events since last visit. She is tolerating meds. She is unable to tell me what dose but states taking 2 pills every night.   She does tell me that she is having headaches. Unsure when this started. She described an occasional burning but constant throbbing sensation across the frontal portion of her head. Most are present in the morning. Ibuprofen  or Advil  usually help but most of the time headache returns. She does snore. She wakes at night feeling like she isn't breathing. She does not drink much water. She tries to drink 1 Gatorade bottle daily. She likes decaf tea and coffee.    BP is usually well managed. She continues atorvastatin  40mg  and asa 81mg  daily.   She reports brain fog since stroke. She lives alone. She has a sister who checks on her regularly. She does not drive. She is able to manage finances and medications. Her mother had dementia.     HISTORY (copied from Dr Georgianne previous note)  Brandy Nguyen is a 58 year old female, seen in request by her primary care physician nurse practitioner Celestia Rosaline SQUIBB, for evaluation of numbness of the left hand, initial evaluation September 13, 2022   I reviewed and summarized the referring note. PMHx. HTN HLD Smoke 1ppd Seizure   She suffered stroke in June 2022, presenting with left-sided weakness, seizure-like activity, personally reviewed MRI of the brain with without contrast on March 10, 2021, large acute or early subacute right posterior MCA infarction cortical edema, extensive petechiae hemorrhage, gyriform enhancement likely  related to blood-brain barrier breakdown, UDS was positive for cocaine Echocardiogram hyperdynamic no wall regional motion abnormality CT angiogram head and neck no large vessel disease   Hospital admission in March 2023 for partial seizure, left-sided body jerking movement, Todd's paralysis, EEG was unremarkable, Laboratory evaluation showed UDS positive for cocaine, benzodiazepines She was started on Keppra  during hospital stay, experienced psychosis with aggressive behavior, but psychosis resolved by day 3 of hospitalization, was seen by neurohospitalist, was switched to Depakote  DR 500 mg twice a day, Personally reviewed MRI of the brain, no acute abnormality, right posterior MCA infarction, encephalomalacia   She continues frustrated by her left-sided body symptoms, weakness, sensory loss, not compliant with her Depakote  DR   REVIEW OF SYSTEMS: Out of a complete 14 system review of symptoms, the patient complains only of the following symptoms, brain fog, headaches, snoring, fatigue and all other reviewed systems are negative.   ALLERGIES: No Known Allergies   HOME MEDICATIONS: Outpatient Medications Prior to Visit  Medication Sig Dispense Refill   amLODipine  (NORVASC ) 10 MG tablet Take 1 tablet (10 mg total) by mouth daily. 90 tablet 1   aspirin  81 MG chewable tablet Chew 1 tablet (  81 mg total) by mouth daily.     atorvastatin  (LIPITOR) 10 MG tablet Take 1 tablet (10 mg total) by mouth daily. 90 tablet 1   divalproex  (DEPAKOTE  ER) 500 MG 24 hr tablet Take 1 tablet (500 mg total) by mouth at bedtime. 90 tablet 3   ibuprofen  (ADVIL ) 800 MG tablet Take 1 tablet (800 mg total) by mouth every 8 (eight) hours as needed. 30 tablet 0   minoxidil (LONITEN) 2.5 MG tablet Take 1.25 mg by mouth daily.     ondansetron  (ZOFRAN -ODT) 4 MG disintegrating tablet Take 1 tablet (4 mg total) by mouth every 8 (eight) hours as needed for nausea or vomiting. 20 tablet 0   ALPRAZolam  (XANAX ) 0.25 MG  tablet Take 1 tablet (0.25 mg total) by mouth at bedtime as needed for sleep (for sleep study). (Patient not taking: Reported on 09/12/2024) 2 tablet 0   docusate sodium  (COLACE) 100 MG capsule Take 1 capsule (100 mg total) by mouth 2 (two) times daily. (Patient not taking: Reported on 09/12/2024) 100 capsule 11   No facility-administered medications prior to visit.     PAST MEDICAL HISTORY: Past Medical History:  Diagnosis Date   Anxiety    Asthma    Hyperlipidemia 03/11/2021   Hypertension      PAST SURGICAL HISTORY: Past Surgical History:  Procedure Laterality Date   TUBAL LIGATION       FAMILY HISTORY: Family History  Problem Relation Age of Onset   Hypertension Mother    Dementia Mother    Colon cancer Neg Hx    Colon polyps Neg Hx    Esophageal cancer Neg Hx    Rectal cancer Neg Hx    Stomach cancer Neg Hx      SOCIAL HISTORY: Social History   Socioeconomic History   Marital status: Married    Spouse name: Not on file   Number of children: Not on file   Years of education: Not on file   Highest education level: Not on file  Occupational History   Not on file  Tobacco Use   Smoking status: Some Days    Current packs/day: 0.25    Types: Cigarettes    Passive exposure: Current   Smokeless tobacco: Current  Substance and Sexual Activity   Alcohol use: Yes   Drug use: Yes    Types: Cocaine   Sexual activity: Not on file  Other Topics Concern   Not on file  Social History Narrative   Right handed    Caffeine rare    Social Drivers of Health   Financial Resource Strain: Low Risk  (05/10/2024)   Overall Financial Resource Strain (CARDIA)    Difficulty of Paying Living Expenses: Not hard at all  Food Insecurity: No Food Insecurity (06/11/2024)   Hunger Vital Sign    Worried About Running Out of Food in the Last Year: Never true    Ran Out of Food in the Last Year: Never true  Transportation Needs: No Transportation Needs (06/11/2024)   PRAPARE -  Administrator, Civil Service (Medical): No    Lack of Transportation (Non-Medical): No  Physical Activity: Insufficiently Active (05/10/2024)   Exercise Vital Sign    Days of Exercise per Week: 7 days    Minutes of Exercise per Session: 20 min  Stress: Stress Concern Present (05/10/2024)   [employer] of Occupational Health - Occupational Stress Questionnaire    Feeling of Stress: To some extent  Social Connections: Moderately Integrated (  05/10/2024)   Social Connection and Isolation Panel    Frequency of Communication with Friends and Family: Once a week    Frequency of Social Gatherings with Friends and Family: Twice a week    Attends Religious Services: 1 to 4 times per year    Active Member of [employer] or Organizations: No    Attends Banker Meetings: 1 to 4 times per year    Marital Status: Separated  Intimate Partner Violence: Not At Risk (06/11/2024)   Humiliation, Afraid, Rape, and Kick questionnaire    Fear of Current or Ex-Partner: No    Emotionally Abused: No    Physically Abused: No    Sexually Abused: No     PHYSICAL EXAM  Vitals:   09/12/24 1435  BP: 137/89  Pulse: 69  Weight: 169 lb 8 oz (76.9 kg)  Height: 4' 11 (1.499 m)     Body mass index is 34.23 kg/m.  Generalized: Well developed, in no acute distress  Cardiology: normal rate and rhythm, no murmur auscultated  Respiratory: clear to auscultation bilaterally    Neurological examination  Mentation: Alert oriented to time, place, history taking. Follows all commands speech and language fluent Cranial nerve II-XII: Pupils were equal round reactive to light. Extraocular movements were full, visual field were full on confrontational test. Facial sensation and strength were normal.  Head turning and shoulder shrug  were normal and symmetric. Motor: The motor testing reveals 5 over 5 strength of all 4 extremities. Good symmetric motor tone is noted throughout.  Sensory: Sensory testing  is intact to soft touch on all 4 extremities. No evidence of extinction is noted.  Coordination: Cerebellar testing reveals good finger-nose-finger and heel-to-shin bilaterally.  Gait and station: Gait is normal.    DIAGNOSTIC DATA (LABS, IMAGING, TESTING) - I reviewed patient records, labs, notes, testing and imaging myself where available.  Lab Results  Component Value Date   WBC 7.5 07/14/2024   HGB 13.2 07/14/2024   HCT 39.1 07/14/2024   MCV 91.1 07/14/2024   PLT 239 07/14/2024      Component Value Date/Time   NA 137 07/14/2024 1239   NA 140 05/16/2024 1110   K 3.8 07/14/2024 1239   CL 103 07/14/2024 1239   CO2 24 07/14/2024 1239   GLUCOSE 109 (H) 07/14/2024 1239   BUN 7 07/14/2024 1239   BUN 9 05/16/2024 1110   CREATININE 0.96 07/14/2024 1239   CALCIUM  9.1 07/14/2024 1239   PROT 6.8 05/16/2024 1110   ALBUMIN 4.4 05/16/2024 1110   AST 21 05/16/2024 1110   ALT 15 05/16/2024 1110   ALKPHOS 82 05/16/2024 1110   BILITOT 0.2 05/16/2024 1110   GFRNONAA >60 07/14/2024 1239   Lab Results  Component Value Date   CHOL 181 05/17/2024   HDL 67 05/17/2024   LDLCALC 104 (H) 05/17/2024   TRIG 50 05/17/2024   CHOLHDL 2.7 05/17/2024   Lab Results  Component Value Date   HGBA1C 5.8 (H) 09/13/2022   No results found for: VITAMINB12 Lab Results  Component Value Date   TSH 1.430 09/13/2022       03/29/2023   10:25 AM  MMSE - Mini Mental State Exam  Orientation to time 5  Orientation to Place 5  Registration 3  Attention/ Calculation 0  Recall 3  Language- name 2 objects 2  Language- repeat 1  Language- follow 3 step command 3  Language- read & follow direction 1  Write a sentence 1  Copy  design 0  Total score 24         No data to display           ASSESSMENT AND PLAN  58 y.o. year old female  has a past medical history of Anxiety, Asthma, Hyperlipidemia (03/11/2021), and Hypertension. here with    Seizure disorder [facility])  Cerebrovascular  accident (CVA), unspecified mechanism (HCC) - Plan: Home sleep test  Right middle cerebral artery stroke (HCC)  Drug abuse (HCC)  Snoring - Plan: Home sleep test  Morning headache - Plan: Home sleep test  Brandy Nguyen is doing fairly well, today. No recent seizure events. She is tolerating divalproex  and will continue 500mg  at bedtime. I will update labs, today. I will ask sleep lab to contact her regarding HST. She is completely alert and oriented, today. BP is well managed.Healthy lifestyle habits encouraged. She will follow up with PCP as directed. She will return to see me pending sleep study. She verbalizes understanding and agreement with this plan.    Orders Placed This Encounter  Procedures   Home sleep test    Standing Status:   Future    Expiration Date:   09/12/2025    Where should this test be performed::   [facility]     No orders of the defined types were placed in this encounter.  I personally spent a total of 30 minutes in the care of the patient today including preparing to see the patient, getting/reviewing separately obtained history, performing a medically appropriate exam/evaluation, counseling and educating, placing orders, referring and communicating with other health care professionals, documenting clinical information in the EHR, independently interpreting results, communicating results, and coordinating care.   Greig Forbes, MSN, FNP-C 09/12/2024, 4:19 PM  [facility] [address], [address] [place], [place] [address] [phone]

## 2024-09-13 ENCOUNTER — Encounter: Payer: Self-pay | Admitting: Internal Medicine

## 2024-09-17 ENCOUNTER — Ambulatory Visit (INDEPENDENT_AMBULATORY_CARE_PROVIDER_SITE_OTHER): Payer: MEDICAID | Admitting: Primary Care

## 2024-09-17 ENCOUNTER — Encounter (INDEPENDENT_AMBULATORY_CARE_PROVIDER_SITE_OTHER): Payer: Self-pay | Admitting: Primary Care

## 2024-09-17 VITALS — BP 131/77 | HR 78 | Resp 16 | Wt 168.4 lb

## 2024-09-17 DIAGNOSIS — F1410 Cocaine abuse, uncomplicated: Secondary | ICD-10-CM

## 2024-09-17 DIAGNOSIS — I10 Essential (primary) hypertension: Secondary | ICD-10-CM

## 2024-09-17 NOTE — Progress Notes (Signed)
 [facility]  Brandy Nguyen, is a 58 y.o. female  RDW:[id number]  FMW:[id number]  DOB - July 19, 1966  Chief Complaint  Patient presents with   Hypertension       Subjective:   Brandy Nguyen is a 58 y.o. female here today for a follow up visit HTN- Patient has No headache, No chest pain, No abdominal pain - No Nausea, No new weakness tingling or numbness, No Cough - shortness of breath Patient has not use crack/cocaine since May 07, 2024 out patient ADS graduated with a certificate of completion. Patient participating in [employer]. - bingo, out to eat discuss life experience goal and what you want to achieve . She has also made new friends.  No problems updated.  Comprehensive ROS Pertinent positive and negative noted in HPI   Allergies[1]  Past Medical History:  Diagnosis Date   Anxiety    Asthma    Hyperlipidemia 03/11/2021   Hypertension     Medications Ordered Prior to Encounter[2] Health Maintenance  Topic Date Due   Hepatitis C Screening  Never done   Colon Cancer Screening  Never done   COVID-19 Vaccine (2 - 2025-26 season) 06/04/2024   Flu Shot  01/01/2025*   Pneumococcal Vaccine for age over 57 (1 of 2 - PCV) 05/10/2025*   Breast Cancer Screening  06/05/2026   Pap with HPV screening  02/17/2028   DTaP/Tdap/Td vaccine (2 - Td or Tdap) 02/16/2033   HIV Screening  Completed   Zoster (Shingles) Vaccine  Completed   Hepatitis B Vaccine  Aged Out   HPV Vaccine  Aged Out   Meningitis B Vaccine  Aged Out  *Topic was postponed. The date shown is not the original due date.    Objective:   Vitals:   09/17/24 1018  BP: 131/77  Pulse: 78  Resp: 16  SpO2: 100%  Weight: 168 lb 6.4 oz (76.4 kg)   BP Readings from Last 3 Encounters:  09/17/24 131/77  09/12/24 137/89  07/24/24 125/74      Physical Exam Vitals reviewed.  Constitutional:      Appearance: Normal appearance. She is obese.  HENT:     Head: Normocephalic.     Right  Ear: Tympanic membrane, ear canal and external ear normal.     Left Ear: Tympanic membrane, ear canal and external ear normal.     Nose: Nose normal.     Mouth/Throat:     Mouth: Mucous membranes are moist.  Eyes:     Extraocular Movements: Extraocular movements intact.     Pupils: Pupils are equal, round, and reactive to light.  Cardiovascular:     Rate and Rhythm: Normal rate and regular rhythm.  Pulmonary:     Effort: Pulmonary effort is normal.     Breath sounds: Normal breath sounds.  Abdominal:     General: Bowel sounds are normal.     Palpations: Abdomen is soft.  Musculoskeletal:        General: Normal range of motion.     Cervical back: Normal range of motion and neck supple.  Skin:    General: Skin is warm and dry.  Neurological:     Mental Status: She is alert and oriented to person, place, and time.  Psychiatric:        Mood and Affect: Mood normal.        Behavior: Behavior normal.        Thought Content: Thought content normal.     Assessment &  Plan  Nayvie was seen today for hypertension.  Diagnoses and all orders for this visit:  Essential hypertension Controlled  BP goal - < 140/90  DIET: Limit salt intake, read nutrition labels to check salt content, limit fried and high fatty foods  Avoid using multisymptom OTC cold preparations that generally contain sudafed which can rise BP. Consult with pharmacist on best cold relief products to use for persons with HTN EXERCISE Discussed incorporating exercise such as walking - 30 minutes most days of the week and can do in 10 minute intervals     Cocaine abuse (HCC) has not use crack/cocaine since May 07, 2024 out patient ADS graduated with a certificate of completion.    Patient have been counseled extensively about nutrition and exercise. Other issues discussed during this visit include: low cholesterol diet, weight control and daily exercise, foot care, annual eye examinations at Ophthalmology, importance  of adherence with medications and regular follow-up. We also discussed long term complications of uncontrolled diabetes and hypertension.   Awaiting dentist to return call for medical clearance having all teeth removed  The patient was given clear instructions to go to ER or return to medical center if symptoms don't improve, worsen or new problems develop. The patient verbalized understanding. The patient was told to call to get lab results if they haven't heard anything in the next week.   This note has been created with Education officer, environmental. Any transcriptional errors are unintentional.   Brandy SHAUNNA Bohr, NP 09/17/2024, 11:38 AM     [1] No Known Allergies [2]  Current Outpatient Medications on File Prior to Visit  Medication Sig Dispense Refill   amLODipine  (NORVASC ) 10 MG tablet Take 1 tablet (10 mg total) by mouth daily. 90 tablet 1   aspirin  81 MG chewable tablet Chew 1 tablet (81 mg total) by mouth daily.     atorvastatin  (LIPITOR) 10 MG tablet Take 1 tablet (10 mg total) by mouth daily. 90 tablet 1   divalproex  (DEPAKOTE  ER) 500 MG 24 hr tablet Take 1 tablet (500 mg total) by mouth at bedtime. 90 tablet 3   ibuprofen  (ADVIL ) 800 MG tablet Take 1 tablet (800 mg total) by mouth every 8 (eight) hours as needed. 30 tablet 0   ondansetron  (ZOFRAN -ODT) 4 MG disintegrating tablet Take 1 tablet (4 mg total) by mouth every 8 (eight) hours as needed for nausea or vomiting. 20 tablet 0   ALPRAZolam  (XANAX ) 0.25 MG tablet Take 1 tablet (0.25 mg total) by mouth at bedtime as needed for sleep (for sleep study). (Patient not taking: Reported on 09/17/2024) 2 tablet 0   docusate sodium  (COLACE) 100 MG capsule Take 1 capsule (100 mg total) by mouth 2 (two) times daily. (Patient not taking: Reported on 09/17/2024) 100 capsule 11   minoxidil (LONITEN) 2.5 MG tablet Take 1.25 mg by mouth daily.     No current facility-administered medications on file prior  to visit.

## 2024-10-02 ENCOUNTER — Ambulatory Visit: Payer: MEDICAID

## 2024-10-02 VITALS — Ht 59.0 in | Wt 166.0 lb

## 2024-10-02 DIAGNOSIS — Z1211 Encounter for screening for malignant neoplasm of colon: Secondary | ICD-10-CM

## 2024-10-02 MED ORDER — PEG 3350-KCL-NA BICARB-NACL 420 G PO SOLR: 4000.0000 mL | Freq: Once | ORAL | 0 refills | Status: AC

## 2024-10-02 NOTE — Progress Notes (Signed)

## 2024-10-16 ENCOUNTER — Ambulatory Visit: Payer: MEDICAID | Admitting: Internal Medicine

## 2024-10-16 ENCOUNTER — Encounter: Payer: Self-pay | Admitting: Internal Medicine

## 2024-10-16 VITALS — BP 109/69 | HR 51 | Temp 97.2°F | Resp 15 | Ht 59.0 in | Wt 166.0 lb

## 2024-10-16 DIAGNOSIS — K648 Other hemorrhoids: Secondary | ICD-10-CM

## 2024-10-16 DIAGNOSIS — Z1211 Encounter for screening for malignant neoplasm of colon: Secondary | ICD-10-CM

## 2024-10-16 MED ORDER — SODIUM CHLORIDE 0.9 % IV SOLN: 500.0000 mL | INTRAVENOUS | Status: AC

## 2024-10-16 NOTE — Op Note (Signed)
 [facility] Patient Name: Brandy Nguyen Procedure Date: 10/16/2024 10:24 AM MRN: [id number] Endoscopist: Rosario Estefana Kidney , , [id number] Age: 59 Referring MD:  Date of Birth: [birth year] Gender: Female Account #: [id number] Procedure:                Colonoscopy Indications:              Screening for colorectal malignant neoplasm, This                            is the patient's first colonoscopy Medicines:                Monitored Anesthesia Care Procedure:                Pre-Anesthesia Assessment:                           - Prior to the procedure, a History and Physical                            was performed, and patient medications and                            allergies were reviewed. The patient's tolerance of                            previous anesthesia was also reviewed. The risks                            and benefits of the procedure and the sedation                            options and risks were discussed with the patient.                            All questions were answered, and informed consent                            was obtained. Prior Anticoagulants: The patient has                            taken no anticoagulant or antiplatelet agents. ASA                            Grade Assessment: III - A patient with severe                            systemic disease. After reviewing the risks and                            benefits, the patient was deemed in satisfactory                            condition to undergo the procedure.  After obtaining informed consent, the colonoscope                            was passed under direct vision. Throughout the                            procedure, the patient's blood pressure, pulse, and                            oxygen saturations were monitored continuously. The                            Olympus Scope SN: [id number] was introduced through                            the anus and  advanced to the the terminal ileum.                            The colonoscopy was performed without difficulty.                            The patient tolerated the procedure well. The                            quality of the bowel preparation was good. The                            terminal ileum, ileocecal valve, appendiceal                            orifice, and rectum were photographed. Scope In: 11:05:19 AM Scope Out: 11:20:50 AM Scope Withdrawal Time: 0 hours 8 minutes 53 seconds  Total Procedure Duration: 0 hours 15 minutes 31 seconds  Findings:                 The terminal ileum appeared normal.                           Non-bleeding internal hemorrhoids were found during                            retroflexion.                           The exam was otherwise without abnormality. Complications:            No immediate complications. Estimated Blood Loss:     Estimated blood loss: none. Impression:               - The examined portion of the ileum was normal.                           - Non-bleeding internal hemorrhoids.                           - The examination was otherwise normal.                           -  No specimens collected. Recommendation:           - Discharge patient to home (with escort).                           - Repeat colonoscopy in 10 years for screening                            purposes.                           - The findings and recommendations were discussed                            with the patient. Dr Estefana Federico Rosario Estefana Federico,  10/16/2024 11:23:32 AM

## 2024-10-16 NOTE — Progress Notes (Signed)
 Report to PACU, RN, vss, BBS= Clear.

## 2024-10-16 NOTE — Progress Notes (Signed)
 Pt's states no medical or surgical changes since previsit or office visit.  Pt had a seizure 05/2024. Per pt she is taking her Depakote  and last does was this morning. CRNA made aware.

## 2024-10-16 NOTE — Patient Instructions (Signed)
 Repeat colonoscopy in 10 years for screening purposes. Handout provided on hemorrhoids.   YOU HAD AN ENDOSCOPIC PROCEDURE TODAY AT THE [facility]:   Refer to the procedure report that was given to you for any specific questions about what was found during the examination.  If the procedure report does not answer your questions, please call your gastroenterologist to clarify.  If you requested that your care partner not be given the details of your procedure findings, then the procedure report has been included in a sealed envelope for you to review at your convenience later.  YOU SHOULD EXPECT: Some feelings of bloating in the abdomen. Passage of more gas than usual.  Walking can help get rid of the air that was put into your GI tract during the procedure and reduce the bloating. If you had a lower endoscopy (such as a colonoscopy or flexible sigmoidoscopy) you may notice spotting of blood in your stool or on the toilet paper. If you underwent a bowel prep for your procedure, you may not have a normal bowel movement for a few days.  Please Note:  You might notice some irritation and congestion in your nose or some drainage.  This is from the oxygen used during your procedure.  There is no need for concern and it should clear up in a day or so.  SYMPTOMS TO REPORT IMMEDIATELY:  Following lower endoscopy (colonoscopy or flexible sigmoidoscopy):  Excessive amounts of blood in the stool  Significant tenderness or worsening of abdominal pains  Swelling of the abdomen that is new, acute  Fever of 100F or higher  For urgent or emergent issues, a gastroenterologist can be reached at any hour by calling [phone]. Do not use MyChart messaging for urgent concerns.    DIET:  We do recommend a small meal at first, but then you may proceed to your regular diet.  Drink plenty of fluids but you should avoid alcoholic beverages for 24 hours.  ACTIVITY:  You should plan to take it easy for  the rest of today and you should NOT DRIVE or use heavy machinery until tomorrow (because of the sedation medicines used during the test).    FOLLOW UP: Our staff will call the number listed on your records the next business day following your procedure.  We will call around 7:15- 8:00 am to check on you and address any questions or concerns that you may have regarding the information given to you following your procedure. If we do not reach you, we will leave a message.     If any biopsies were taken you will be contacted by phone or by letter within the next 1-3 weeks.  Please call us  at [phone] if you have not heard about the biopsies in 3 weeks.    SIGNATURES/CONFIDENTIALITY: You and/or your care partner have signed paperwork which will be entered into your electronic medical record.  These signatures attest to the fact that that the information above on your After Visit Summary has been reviewed and is understood.  Full responsibility of the confidentiality of this discharge information lies with you and/or your care-partner.

## 2024-10-16 NOTE — Progress Notes (Signed)
 "   GASTROENTEROLOGY PROCEDURE H&P NOTE   Primary Care Physician: Celestia Rosaline SQUIBB, NP    Reason for Procedure:  Colon cancer screening  Plan:    Colonoscopy  Patient is appropriate for endoscopic procedure(s) in the ambulatory ([facility]) setting.  The nature of the procedure, as well as the risks, benefits, and alternatives were carefully and thoroughly reviewed with the patient. Ample time for discussion and questions allowed. The patient understood, was satisfied, and agreed to proceed.     HPI: Brandy Nguyen is a 59 y.o. female who presents for colonoscopy for colon cancer screening. Denies blood in stools, changes in bowel habits, or unintentional weight loss. Denies family history of colon cancer. This is her first colonoscopy.  Past Medical History:  Diagnosis Date   Anxiety    Asthma    Hyperlipidemia 03/11/2021   Hypertension    Seizures (HCC)    ON MEDICATION   Stroke [facility])     Past Surgical History:  Procedure Laterality Date   TUBAL LIGATION      Prior to Admission medications  Medication Sig Start Date End Date Taking? Authorizing Provider  amLODipine  (NORVASC ) 10 MG tablet Take 1 tablet (10 mg total) by mouth daily. 05/16/24  Yes Celestia Rosaline SQUIBB, NP  aspirin  81 MG chewable tablet Chew 1 tablet (81 mg total) by mouth daily. 03/17/21  Yes Autry-Lott, Rojean, DO  atorvastatin  (LIPITOR) 10 MG tablet Take 1 tablet (10 mg total) by mouth daily. 05/24/24  Yes Celestia Rosaline SQUIBB, NP  divalproex  (DEPAKOTE  ER) 500 MG 24 hr tablet Take 1 tablet (500 mg total) by mouth at bedtime. 05/16/24  Yes Lomax, Amy, NP  minoxidil (LONITEN) 2.5 MG tablet Take 1.25 mg by mouth daily. 08/20/24  Yes [provider]  ondansetron  (ZOFRAN -ODT) 4 MG disintegrating tablet Take 1 tablet (4 mg total) by mouth every 8 (eight) hours as needed for nausea or vomiting. 07/14/24  Yes Laurice Maude BROCKS, MD  ALPRAZolam  (XANAX ) 0.25 MG tablet Take 1 tablet (0.25 mg total) by mouth at  bedtime as needed for sleep (for sleep study). Patient not taking: Reported on 05/10/2024 06/21/23   Dohmeier, Dedra, MD  docusate sodium  (COLACE) 100 MG capsule Take 1 capsule (100 mg total) by mouth 2 (two) times daily. Patient not taking: Reported on 10/02/2024 05/16/24   Celestia Rosaline SQUIBB, NP  ibuprofen  (ADVIL ) 800 MG tablet Take 1 tablet (800 mg total) by mouth every 8 (eight) hours as needed. Patient not taking: Reported on 10/16/2024 07/11/24   Mayers, Kirk RAMAN, PA-C    Current Outpatient Medications  Medication Sig Dispense Refill   amLODipine  (NORVASC ) 10 MG tablet Take 1 tablet (10 mg total) by mouth daily. 90 tablet 1   aspirin  81 MG chewable tablet Chew 1 tablet (81 mg total) by mouth daily.     atorvastatin  (LIPITOR) 10 MG tablet Take 1 tablet (10 mg total) by mouth daily. 90 tablet 1   divalproex  (DEPAKOTE  ER) 500 MG 24 hr tablet Take 1 tablet (500 mg total) by mouth at bedtime. 90 tablet 3   minoxidil (LONITEN) 2.5 MG tablet Take 1.25 mg by mouth daily.     ondansetron  (ZOFRAN -ODT) 4 MG disintegrating tablet Take 1 tablet (4 mg total) by mouth every 8 (eight) hours as needed for nausea or vomiting. 20 tablet 0   ALPRAZolam  (XANAX ) 0.25 MG tablet Take 1 tablet (0.25 mg total) by mouth at bedtime as needed for sleep (for sleep study). (Patient not taking: Reported on  05/10/2024) 2 tablet 0   docusate sodium  (COLACE) 100 MG capsule Take 1 capsule (100 mg total) by mouth 2 (two) times daily. (Patient not taking: Reported on 10/02/2024) 100 capsule 11   ibuprofen  (ADVIL ) 800 MG tablet Take 1 tablet (800 mg total) by mouth every 8 (eight) hours as needed. (Patient not taking: Reported on 10/16/2024) 30 tablet 0   Current Facility-Administered Medications  Medication Dose Route Frequency Provider Last Rate Last Admin   0.9 %  sodium chloride  infusion  500 mL Intravenous Continuous Federico Rosario BROCKS, MD        Allergies as of 10/16/2024   (No Known Allergies)    Family History  Problem  Relation Age of Onset   Hypertension Mother    Dementia Mother    Colon cancer Neg Hx    Colon polyps Neg Hx    Esophageal cancer Neg Hx    Rectal cancer Neg Hx    Stomach cancer Neg Hx     Social History   Socioeconomic History   Marital status: Married    Spouse name: Not on file   Number of children: Not on file   Years of education: Not on file   Highest education level: Not on file  Occupational History   Not on file  Tobacco Use   Smoking status: Some Days    Current packs/day: 0.25    Types: Cigarettes    Passive exposure: Current   Smokeless tobacco: Never  Vaping Use   Vaping status: Never Used  Substance and Sexual Activity   Alcohol use: Not Currently   Drug use: Not Currently    Types: Cocaine    Comment: LAST USE 05/07/2024   Sexual activity: Not on file  Other Topics Concern   Not on file  Social History Narrative   Right handed    Caffeine rare    Social Drivers of Health   Tobacco Use: High Risk (10/16/2024)   Patient History    Smoking Tobacco Use: Some Days    Smokeless Tobacco Use: Never    Passive Exposure: Current  Financial Resource Strain: Low Risk (05/10/2024)   Overall Financial Resource Strain (CARDIA)    Difficulty of Paying Living Expenses: Not hard at all  Food Insecurity: No Food Insecurity (06/11/2024)   Epic    Worried About Programme Researcher, Broadcasting/film/video in the Last Year: Never true    Ran Out of Food in the Last Year: Never true  Transportation Needs: No Transportation Needs (06/11/2024)   Epic    Lack of Transportation (Medical): No    Lack of Transportation (Non-Medical): No  Physical Activity: Insufficiently Active (05/10/2024)   Exercise Vital Sign    Days of Exercise per Week: 7 days    Minutes of Exercise per Session: 20 min  Stress: Stress Concern Present (05/10/2024)   [employer] of Occupational Health - Occupational Stress Questionnaire    Feeling of Stress: To some extent  Social Connections: Moderately Integrated  (05/10/2024)   Social Connection and Isolation Panel    Frequency of Communication with Friends and Family: Once a week    Frequency of Social Gatherings with Friends and Family: Twice a week    Attends Religious Services: 1 to 4 times per year    Active Member of [employer] or Organizations: No    Attends Banker Meetings: 1 to 4 times per year    Marital Status: Separated  Intimate Partner Violence: Not At Risk (06/11/2024)   Epic  Fear of Current or Ex-Partner: No    Emotionally Abused: No    Physically Abused: No    Sexually Abused: No  Depression (PHQ2-9): Low Risk (09/17/2024)   Depression (PHQ2-9)    PHQ-2 Score: 0  Alcohol Screen: Low Risk (05/10/2024)   Alcohol Screen    Last Alcohol Screening Score (AUDIT): 2  Housing: Low Risk (06/11/2024)   Epic    Unable to Pay for Housing in the Last Year: No    Number of Times Moved in the Last Year: 0    Homeless in the Last Year: No  Utilities: Not At Risk (06/11/2024)   Epic    Threatened with loss of utilities: No  Health Literacy: Inadequate Health Literacy (05/10/2024)   B1300 Health Literacy    Frequency of need for help with medical instructions: Sometimes    Physical Exam: Vital signs in last 24 hours: BP 137/72   Pulse (!) 56   Temp (!) 97.2 F (36.2 C)   Ht 4' 11 (1.499 m)   Wt 166 lb (75.3 kg)   SpO2 99%   BMI 33.53 kg/m  GEN: NAD EYE: Sclerae anicteric ENT: MMM CV: Non-tachycardic Pulm: No increased work of breathing GI: Soft, NT/ND NEURO:  Alert & Oriented   Estefana Kidney, MD [facility]  10/16/2024 10:25 AM  "

## 2024-10-17 ENCOUNTER — Ambulatory Visit (INDEPENDENT_AMBULATORY_CARE_PROVIDER_SITE_OTHER): Payer: Self-pay | Admitting: Primary Care

## 2024-10-17 ENCOUNTER — Telehealth: Payer: Self-pay

## 2024-10-17 NOTE — Telephone Encounter (Signed)
"  Left HIPAA compliant voicemail.   "

## 2024-10-18 ENCOUNTER — Telehealth (INDEPENDENT_AMBULATORY_CARE_PROVIDER_SITE_OTHER): Payer: Self-pay | Admitting: Primary Care

## 2024-10-18 NOTE — Telephone Encounter (Signed)
 Spoke to pt about upcoming appt.. Will be present

## 2024-10-24 ENCOUNTER — Encounter (HOSPITAL_BASED_OUTPATIENT_CLINIC_OR_DEPARTMENT_OTHER): Admitting: Family Medicine

## 2024-10-24 ENCOUNTER — Telehealth (INDEPENDENT_AMBULATORY_CARE_PROVIDER_SITE_OTHER): Payer: Self-pay | Admitting: Primary Care

## 2024-10-24 NOTE — Telephone Encounter (Unsigned)
 Copied from CRM #[id number]. Topic: General - Other >> Oct 24, 2024  3:36 PM Tiffini S wrote: Reason for CRM:  Patient asked for appointment letters to be mailed out to her appointment for 12/03/2024

## 2024-10-30 ENCOUNTER — Ambulatory Visit: Payer: MEDICAID | Admitting: Primary Care

## 2024-11-23 ENCOUNTER — Ambulatory Visit (HOSPITAL_BASED_OUTPATIENT_CLINIC_OR_DEPARTMENT_OTHER): Admitting: Family Medicine

## 2024-12-03 ENCOUNTER — Ambulatory Visit: Payer: MEDICAID | Admitting: Primary Care

## 2024-12-03 ENCOUNTER — Ambulatory Visit: Admitting: Family Medicine
